# Patient Record
Sex: Male | Born: 1945 | Race: White | Hispanic: No | Marital: Married | Smoking: Never smoker
Health system: Southern US, Community
[De-identification: ages and names within clinical notes are randomized; demographics above are authoritative.]

## PROBLEM LIST (undated history)

## (undated) DIAGNOSIS — E785 Hyperlipidemia, unspecified: Secondary | ICD-10-CM

## (undated) DIAGNOSIS — I10 Essential (primary) hypertension: Secondary | ICD-10-CM

## (undated) DIAGNOSIS — C801 Malignant (primary) neoplasm, unspecified: Secondary | ICD-10-CM

## (undated) DIAGNOSIS — N189 Chronic kidney disease, unspecified: Secondary | ICD-10-CM

## (undated) DIAGNOSIS — I639 Cerebral infarction, unspecified: Secondary | ICD-10-CM

## (undated) DIAGNOSIS — C61 Malignant neoplasm of prostate: Secondary | ICD-10-CM

## (undated) HISTORY — DX: Chronic kidney disease, unspecified: N18.9

## (undated) HISTORY — DX: Essential (primary) hypertension: I10

## (undated) HISTORY — DX: Malignant (primary) neoplasm, unspecified: C80.1

## (undated) HISTORY — PX: PROSTATE SURGERY: SHX751

## (undated) HISTORY — PX: OTHER SURGICAL HISTORY: SHX169

## (undated) HISTORY — DX: Malignant neoplasm of prostate: C61

## (undated) HISTORY — DX: Hyperlipidemia, unspecified: E78.5

## (undated) HISTORY — DX: Cerebral infarction, unspecified: I63.9

## (undated) HISTORY — PX: HERNIA REPAIR: SHX51

---

## 2015-01-07 LAB — HM COLONOSCOPY

## 2016-10-18 DIAGNOSIS — I639 Cerebral infarction, unspecified: Secondary | ICD-10-CM | POA: Insufficient documentation

## 2016-11-08 DIAGNOSIS — Z85528 Personal history of other malignant neoplasm of kidney: Secondary | ICD-10-CM | POA: Insufficient documentation

## 2016-11-08 DIAGNOSIS — E782 Mixed hyperlipidemia: Secondary | ICD-10-CM | POA: Insufficient documentation

## 2016-11-08 DIAGNOSIS — I611 Nontraumatic intracerebral hemorrhage in hemisphere, cortical: Secondary | ICD-10-CM | POA: Insufficient documentation

## 2016-11-09 DIAGNOSIS — I10 Essential (primary) hypertension: Secondary | ICD-10-CM | POA: Insufficient documentation

## 2016-11-11 DIAGNOSIS — I609 Nontraumatic subarachnoid hemorrhage, unspecified: Secondary | ICD-10-CM | POA: Insufficient documentation

## 2017-12-28 DIAGNOSIS — N183 Chronic kidney disease, stage 3 unspecified: Secondary | ICD-10-CM | POA: Insufficient documentation

## 2017-12-28 DIAGNOSIS — I4510 Unspecified right bundle-branch block: Secondary | ICD-10-CM | POA: Insufficient documentation

## 2017-12-28 DIAGNOSIS — M8580 Other specified disorders of bone density and structure, unspecified site: Secondary | ICD-10-CM | POA: Insufficient documentation

## 2017-12-28 DIAGNOSIS — K824 Cholesterolosis of gallbladder: Secondary | ICD-10-CM | POA: Insufficient documentation

## 2019-05-08 ENCOUNTER — Encounter: Payer: Self-pay | Admitting: Family Medicine

## 2019-05-08 ENCOUNTER — Other Ambulatory Visit: Payer: Self-pay

## 2019-05-08 ENCOUNTER — Ambulatory Visit: Payer: Medicare PPO | Admitting: Family Medicine

## 2019-05-08 VITALS — BP 120/80 | HR 76 | Ht 69.0 in | Wt 191.0 lb

## 2019-05-08 DIAGNOSIS — Z23 Encounter for immunization: Secondary | ICD-10-CM

## 2019-05-08 DIAGNOSIS — Z85820 Personal history of malignant melanoma of skin: Secondary | ICD-10-CM

## 2019-05-08 DIAGNOSIS — I10 Essential (primary) hypertension: Secondary | ICD-10-CM

## 2019-05-08 DIAGNOSIS — Z7689 Persons encountering health services in other specified circumstances: Secondary | ICD-10-CM | POA: Diagnosis not present

## 2019-05-08 DIAGNOSIS — E782 Mixed hyperlipidemia: Secondary | ICD-10-CM | POA: Diagnosis not present

## 2019-05-08 DIAGNOSIS — C61 Malignant neoplasm of prostate: Secondary | ICD-10-CM

## 2019-05-08 MED ORDER — PROPRANOLOL HCL ER 60 MG PO CP24: 180.0000 mg | ORAL_CAPSULE | Freq: Every day | ORAL | 1 refills | Status: DC

## 2019-05-08 MED ORDER — LISINOPRIL 20 MG PO TABS: 20.0000 mg | ORAL_TABLET | Freq: Every day | ORAL | 1 refills | Status: DC

## 2019-05-08 MED ORDER — ROSUVASTATIN CALCIUM 10 MG PO TABS: 10.0000 mg | ORAL_TABLET | Freq: Every day | ORAL | 1 refills | Status: DC

## 2019-05-08 NOTE — Progress Notes (Signed)
Date:  05/08/2019   Name:  Jason Krause   DOB:  Jun 16, 1945   MRN:  [id number]   Chief Complaint: [place] (needs referral to derm and eye doctor for yearly check ups)  Patient is a 74 year old male who presents for a establishment exam. The patient reports the following problems: referral. Health maintenance has been reviewed needs pneumococcal 13   No results found for: CREATININE, BUN, NA, K, CL, CO2 No results found for: CHOL, HDL, LDLCALC, LDLDIRECT, TRIG, CHOLHDL No results found for: TSH No results found for: HGBA1C   Review of Systems  Constitutional: Negative for chills and fever.  HENT: Negative for drooling, ear discharge, ear pain and sore throat.   Respiratory: Negative for cough, shortness of breath and wheezing.   Cardiovascular: Negative for chest pain, palpitations and leg swelling.  Gastrointestinal: Negative for abdominal pain, blood in stool, constipation, diarrhea and nausea.  Endocrine: Negative for polydipsia.  Genitourinary: Negative for dysuria, frequency, hematuria and urgency.  Musculoskeletal: Negative for back pain, myalgias and neck pain.  Skin: Negative for rash.  Allergic/Immunologic: Negative for environmental allergies.  Neurological: Negative for dizziness and headaches.  Hematological: Does not bruise/bleed easily.  Psychiatric/Behavioral: Negative for suicidal ideas. The patient is not nervous/anxious.     Patient Active Problem List   Diagnosis Date Noted  . CKD (chronic kidney disease) stage 3, GFR 30-59 ml/min 12/28/2017  . Osteopenia 12/28/2017  . Polyp of gallbladder 12/28/2017  . Right bundle branch block 12/28/2017  . SAH (subarachnoid hemorrhage) ([place]) 11/11/2016  . Essential hypertension 11/09/2016  . Mixed hyperlipidemia 11/08/2016  . Nontraumatic cortical hemorrhage of left cerebral hemisphere ([place]) 11/08/2016  . Personal history of renal cell carcinoma 11/08/2016  . Cerebrovascular accident ([place]) 10/18/2016  .  History of kidney cancer 01/19/2007  . 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[place]) 01/12/2001    No Known Allergies   Social History   Tobacco Use  . Smoking status: Never Smoker  . Smokeless tobacco: Never Used  Substance Use Topics  . Alcohol use: Yes    Comment: social  . Drug use: Never     Medication list has been reviewed and updated.  Current Meds  Medication Sig  . Coenzyme Q10 100 MG capsule Take 1 capsule by mouth daily.  . folic acid (FOLVITE) A999333 MCG tablet Take 1 tablet by mouth daily.  . Glucos-Chondroit-Hyaluron-MSM (GLUCOSAMINE CHONDROITIN JOINT PO) Take 2 tablets by mouth daily.  Marland Kitchen lisinopril (ZESTRIL) 20 MG tablet Take 1 tablet by mouth daily.  . Multiple Vitamins-Minerals (CENTRUM SILVER PO) Take 1 tablet by mouth daily.  . propranolol ER (INDERAL LA) 60 MG 24 hr capsule Take 3 capsules by mouth daily. 2 capsules in am and 1 in the pm  . rosuvastatin (CRESTOR) 10 MG tablet Take 1 tablet by mouth daily.    PHQ 2/9 Scores 05/08/2019  PHQ - 2 Score 0  PHQ- 9 Score 0    BP Readings from Last 3 Encounters:  05/08/19 120/80    Physical Exam Vitals and nursing note reviewed.  HENT:     Head: Normocephalic.     Right Ear: Tympanic membrane and external ear normal.     Left Ear: Tympanic membrane and external ear normal.     Nose: Nose normal. No congestion.     Mouth/Throat:     Mouth: Mucous membranes are moist.  Eyes:     General: No scleral icterus.       Right eye: No discharge.  Left eye: No discharge.     Conjunctiva/sclera: Conjunctivae normal.     Pupils: Pupils are equal, round, and reactive to light.  Neck:     Thyroid: No thyromegaly.     Vascular: No JVD.     Trachea: No tracheal deviation.  Cardiovascular:     Rate and Rhythm: Normal rate and regular rhythm.     Heart sounds: Normal heart sounds. No murmur. No friction rub. No gallop.   Pulmonary:     Effort: No respiratory distress.     Breath sounds: Normal breath sounds. No wheezing or  rales.  Abdominal:     General: Bowel sounds are normal.     Palpations: Abdomen is soft. There is no mass.     Tenderness: There is no abdominal tenderness. There is no guarding or rebound.  Musculoskeletal:        General: No tenderness. Normal range of motion.     Cervical back: Normal range of motion and neck supple.  Lymphadenopathy:     Cervical: No cervical adenopathy.  Skin:    General: Skin is warm.     Findings: No rash.  Neurological:     Mental Status: He is alert and oriented to person, place, and time.     Cranial Nerves: No cranial nerve deficit.     Deep Tendon Reflexes: Reflexes are normal and symmetric.     Wt Readings from Last 3 Encounters:  05/08/19 191 lb (86.6 kg)    BP 120/80   Pulse 76   Ht 5\' 9"  (1.753 m)   Wt 191 lb (86.6 kg)   BMI 28.21 kg/m   Assessment and Plan: 1. Establishing care with new doctor, encounter for Patient to establish care with new physician.  Patient's previous encounters, care elsewhere, and most recent labs/imaging were reviewed.  Patient's medications will be refilled and referrals will be made per patient's desire. - Ambulatory referral to Dermatology - Ambulatory referral to Urology  2. Essential hypertension Chronic.  Controlled.  Stable.  Continue lisinopril 20 mg once a day and propranolol ER 60 mg 2 in the morning and 1 in the evening.  This is also used for tremor but is probably giving some blood pressure lowering effect. - lisinopril (ZESTRIL) 20 MG tablet; Take 1 tablet (20 mg total) by mouth daily.  Dispense: 90 tablet; Refill: 1 - propranolol ER (INDERAL LA) 60 MG 24 hr capsule; Take 3 capsules (180 mg total) by mouth daily. 2 capsules in am and 1 in the pm  Dispense: 270 capsule; Refill: 1  3. Mixed hyperlipidemia Chronic.  Controlled.  Stable.  Continue Crestor 10 mg once a day. - rosuvastatin (CRESTOR) 10 MG tablet; Take 1 tablet (10 mg total) by mouth daily.  Dispense: 90 tablet; Refill: 1  4. Need for  pneumococcal vaccination Discussed and administered - Pneumococcal polysaccharide vaccine 23-valent greater than or equal to 2yo subcutaneous/IM  5. Carcinoma of prostate [facility]) Patient has a history of prostate cancer status post prostatectomy which is reevaluating on an annual basis by urology.  Patient has moved here from [place] and we will establish with urology for follow-up maintenance.  6. History of melanoma Patient has a history of melanoma but other concerns that I suspect are of an actinic/basal cell characteristic.  Patient desires to see a local dermatologist and is very pleased to have locally..  Will refer for evaluation and treatment to Dr. Phillip Heal dermatology.

## 2019-05-08 NOTE — Patient Instructions (Signed)

## 2019-05-09 ENCOUNTER — Other Ambulatory Visit: Payer: Medicare PPO

## 2019-05-09 DIAGNOSIS — I10 Essential (primary) hypertension: Secondary | ICD-10-CM

## 2019-05-09 DIAGNOSIS — E782 Mixed hyperlipidemia: Secondary | ICD-10-CM

## 2019-05-10 LAB — LIPID PANEL
Chol/HDL Ratio: 3.3 ratio (ref 0.0–5.0)
Cholesterol, Total: 153 mg/dL (ref 100–199)
HDL: 46 mg/dL (ref >39)
LDL Chol Calc (NIH): 93 mg/dL (ref 0–99)
Triglycerides: 73 mg/dL (ref 0–149)
VLDL Cholesterol Cal: 14 mg/dL (ref 5–40)

## 2019-05-10 LAB — RENAL FUNCTION PANEL
Albumin: 4.5 g/dL (ref 3.7–4.7)
BUN/Creatinine Ratio: 13 (ref 10–24)
BUN: 18 mg/dL (ref 8–27)
CO2: 25 mmol/L (ref 20–29)
Calcium: 10.2 mg/dL (ref 8.6–10.2)
Chloride: 105 mmol/L (ref 96–106)
Creatinine, Ser: 1.36 mg/dL — ABNORMAL HIGH (ref 0.76–1.27)
GFR calc Af Amer: 60 mL/min/{1.73_m2} (ref >59)
GFR calc non Af Amer: 52 mL/min/{1.73_m2} — ABNORMAL LOW (ref >59)
Glucose: 76 mg/dL (ref 65–99)
Phosphorus: 3.2 mg/dL (ref 2.8–4.1)
Potassium: 4.5 mmol/L (ref 3.5–5.2)
Sodium: 145 mmol/L — ABNORMAL HIGH (ref 134–144)

## 2019-05-10 LAB — PSA: Prostate Specific Ag, Serum: <0.1 ng/mL (ref 0.0–4.0)

## 2019-05-22 ENCOUNTER — Ambulatory Visit (INDEPENDENT_AMBULATORY_CARE_PROVIDER_SITE_OTHER): Payer: Medicare PPO | Admitting: Urology

## 2019-05-22 ENCOUNTER — Encounter: Payer: Self-pay | Admitting: Urology

## 2019-05-22 ENCOUNTER — Other Ambulatory Visit: Payer: Self-pay

## 2019-05-22 VITALS — BP 144/80 | HR 55 | Ht 69.0 in | Wt 191.0 lb

## 2019-05-22 DIAGNOSIS — Z85528 Personal history of other malignant neoplasm of kidney: Secondary | ICD-10-CM

## 2019-05-22 DIAGNOSIS — C61 Malignant neoplasm of prostate: Secondary | ICD-10-CM | POA: Diagnosis not present

## 2019-05-22 NOTE — Progress Notes (Signed)
05/22/19 1:23 PM   Jason Krause Dec 22, 1945 [id number]  Referring provider: Juline Patch, MD [address] [place],  [place] [address]  CC: Hx of kidney and prostate cancer  HPI: I saw [name] today to establish care for history of kidney and prostate cancer today.  Briefly he is a very healthy 74 year old male who recently moved to the area from [place].  He was previously under the care of Dr. Sherlyn Lick at [facility] urological.  He reportedly underwent an open radical prostatectomy 19 years ago for prostate cancer, as well as an open nephrectomy around 10 years ago for kidney cancer.  He is unsure what his pathology was.  PSA has remained undetectable, and was most recently <0.1 in January 2021.  He has mild CKD at baseline with most recent creatinine of 1.36, eGFR 52.  He denies any flank pain or gross hematuria.  He occasionally will have some symptoms of weak stream, but is minimally bothered by this.  He denies any history of urinary tract infections or retention.   PMH: Past Medical History:  Diagnosis Date  . Cancer [facility])    kidney and prostate  . Chronic kidney disease    stage 3  . Hyperlipidemia   . Hypertension   . Stroke [facility])     Surgical History: Past Surgical History:  Procedure Laterality Date  . HERNIA REPAIR    . kidney removal due to cancer Right   . PROSTATE SURGERY     removed   Allergies: No Known Allergies  Family History: Family History  Problem Relation Age of Onset  . Cancer Father   . Heart disease Father   . Diabetes Sister   . Stroke Maternal Grandmother   . Heart disease Paternal Grandmother     Social History:  reports that he has never smoked. He has never used smokeless tobacco. He reports current alcohol use. He reports that he does not use drugs.  ROS: Please see flowsheet from today's date for complete review of systems.  Physical Exam: BP (!) 144/80 (BP Location: Left Arm, Patient Position: Sitting, Cuff Size:  Normal)   Pulse (!) 55   Ht 5' 9"  (1.753 m)   Wt 191 lb (86.6 kg)   BMI 28.21 kg/m    Constitutional:  Alert and oriented, No acute distress. Cardiovascular: No clubbing, cyanosis, or edema. Respiratory: Normal respiratory effort, no increased work of breathing. GI: Abdomen is soft, nontender, nondistended, no abdominal masses Lymph: No cervical or inguinal lymphadenopathy. Skin: No rashes, bruises or suspicious lesions. Neurologic: Grossly intact, no focal deficits, moving all 4 extremities. Psychiatric: Normal mood and affect.  Laboratory Data: Reviewed, see HPI  Pertinent Imaging: No prior imaging available to review  Assessment & Plan:   In summary, he is a healthy 74 year old male with reported history of open radical prostatectomy 19 years ago with no recurrence of PSA since that time, as well as an open radical nephrectomy approximately 10 years ago for kidney cancer.  He was previously in the care of Dr. Sherlyn Lick at [facility] urological, and his records are not available to me today.  We discussed that we typically discontinue PSA surveillance 10 years out from definitive treatment for prostate cancer.  Follow-up after kidney cancer is based on staging and cell type.  We will obtain outside urology records, but he likely does not require further surveillance imaging or lab work based on AUA guidelines.  Follow-up pending these results.  A total of 45 minutes  were spent face-to-face with the patient, greater than 50% was spent in patient education, counseling, and coordination of care regarding history of prostate and kidney cancer.  Billey Co, [facility] [address] [place], [place] [address] [phone]

## 2019-05-25 ENCOUNTER — Ambulatory Visit: Payer: Self-pay | Attending: Internal Medicine

## 2019-05-25 ENCOUNTER — Other Ambulatory Visit: Payer: Self-pay

## 2019-05-25 DIAGNOSIS — Z23 Encounter for immunization: Secondary | ICD-10-CM | POA: Insufficient documentation

## 2019-05-25 NOTE — Progress Notes (Signed)
   Covid-19 Vaccination Clinic  Name:  Jason Krause    MRN: [id number] DOB: 23-Nov-1945  05/25/2019  [name] was observed post Covid-19 immunization for 15 minutes without incidence. He was provided with Vaccine Information Sheet and instruction to access the V-Safe system.   Mr. Mode was instructed to call 911 with any severe reactions post vaccine: Marland Kitchen Difficulty breathing  . Swelling of your face and throat  . A fast heartbeat  . A bad rash all over your body  . Dizziness and weakness    Immunizations Administered    Name Date Dose VIS Date Route   Moderna COVID-19 Vaccine 05/25/2019  5:16 PM 0.5 mL 03/20/2019 Intramuscular   Manufacturer: Moderna   Lot: [id number]   [id number]

## 2019-06-26 ENCOUNTER — Ambulatory Visit: Payer: Self-pay | Attending: Internal Medicine

## 2019-06-26 DIAGNOSIS — Z23 Encounter for immunization: Secondary | ICD-10-CM | POA: Insufficient documentation

## 2019-06-26 NOTE — Progress Notes (Signed)
   Covid-19 Vaccination Clinic  Name:  Jason Krause    MRN: [id number] DOB: 10-Dec-1945  06/26/2019  [name] was observed post Covid-19 immunization for 15 minutes without incident. He was provided with Vaccine Information Sheet and instruction to access the V-Safe system.   [name] was instructed to call 911 with any severe reactions post vaccine: Marland Kitchen Difficulty breathing  . Swelling of face and throat  . A fast heartbeat  . A bad rash all over body  . Dizziness and weakness   Immunizations Administered    Name Date Dose VIS Date Route   Moderna COVID-19 Vaccine 06/26/2019  4:18 PM 0.5 mL 03/20/2019 Intramuscular   Manufacturer: Moderna   Lot: [id number]   [id number]

## 2019-08-09 DIAGNOSIS — Z85820 Personal history of malignant melanoma of skin: Secondary | ICD-10-CM | POA: Diagnosis not present

## 2019-08-09 DIAGNOSIS — L570 Actinic keratosis: Secondary | ICD-10-CM | POA: Diagnosis not present

## 2019-08-09 DIAGNOSIS — L578 Other skin changes due to chronic exposure to nonionizing radiation: Secondary | ICD-10-CM | POA: Diagnosis not present

## 2019-08-27 ENCOUNTER — Encounter: Payer: Self-pay | Admitting: Family Medicine

## 2019-09-06 DIAGNOSIS — M170 Bilateral primary osteoarthritis of knee: Secondary | ICD-10-CM | POA: Diagnosis not present

## 2019-10-05 ENCOUNTER — Other Ambulatory Visit: Payer: Self-pay | Admitting: Family Medicine

## 2019-10-05 DIAGNOSIS — E782 Mixed hyperlipidemia: Secondary | ICD-10-CM

## 2019-10-24 DIAGNOSIS — I610 Nontraumatic intracerebral hemorrhage in hemisphere, subcortical: Secondary | ICD-10-CM | POA: Diagnosis not present

## 2019-11-01 DIAGNOSIS — M6281 Muscle weakness (generalized): Secondary | ICD-10-CM | POA: Diagnosis not present

## 2019-11-01 DIAGNOSIS — R262 Difficulty in walking, not elsewhere classified: Secondary | ICD-10-CM | POA: Diagnosis not present

## 2019-11-06 ENCOUNTER — Encounter: Payer: Self-pay | Admitting: Family Medicine

## 2019-11-06 ENCOUNTER — Other Ambulatory Visit: Payer: Self-pay

## 2019-11-06 ENCOUNTER — Ambulatory Visit: Payer: Medicare PPO | Admitting: Family Medicine

## 2019-11-06 VITALS — BP 130/80 | HR 68 | Ht 69.0 in | Wt 186.0 lb

## 2019-11-06 DIAGNOSIS — Z1211 Encounter for screening for malignant neoplasm of colon: Secondary | ICD-10-CM

## 2019-11-06 DIAGNOSIS — N1831 Chronic kidney disease, stage 3a: Secondary | ICD-10-CM

## 2019-11-06 DIAGNOSIS — C61 Malignant neoplasm of prostate: Secondary | ICD-10-CM | POA: Diagnosis not present

## 2019-11-06 DIAGNOSIS — I10 Essential (primary) hypertension: Secondary | ICD-10-CM | POA: Diagnosis not present

## 2019-11-06 DIAGNOSIS — I679 Cerebrovascular disease, unspecified: Secondary | ICD-10-CM | POA: Diagnosis not present

## 2019-11-06 DIAGNOSIS — E782 Mixed hyperlipidemia: Secondary | ICD-10-CM

## 2019-11-06 DIAGNOSIS — Z85528 Personal history of other malignant neoplasm of kidney: Secondary | ICD-10-CM | POA: Diagnosis not present

## 2019-11-06 LAB — POCT URINALYSIS DIPSTICK
Bilirubin, UA: NEGATIVE
Blood, UA: NEGATIVE
Glucose, UA: NEGATIVE
Ketones, UA: NEGATIVE
Leukocytes, UA: NEGATIVE
Nitrite, UA: NEGATIVE
Protein, UA: NEGATIVE
Spec Grav, UA: 1.02 (ref 1.010–1.025)
Urobilinogen, UA: 0.2 E.U./dL
pH, UA: 5 (ref 5.0–8.0)

## 2019-11-06 MED ORDER — ROSUVASTATIN CALCIUM 10 MG PO TABS: ORAL_TABLET | ORAL | 1 refills | Status: DC

## 2019-11-06 MED ORDER — PROPRANOLOL HCL ER 60 MG PO CP24: 180.0000 mg | ORAL_CAPSULE | Freq: Every day | ORAL | 1 refills | Status: DC

## 2019-11-06 MED ORDER — LISINOPRIL 20 MG PO TABS: 20.0000 mg | ORAL_TABLET | Freq: Every day | ORAL | 1 refills | Status: DC

## 2019-11-06 NOTE — Progress Notes (Signed)
Date:  11/06/2019   Name:  Jason Krause   DOB:  [birth year]   MRN:  [id number]   Chief Complaint: Hypertension and Hyperlipidemia  Hypertension This is a chronic problem. The current episode started more than 1 year ago. The problem has been gradually improving since onset. The problem is controlled. Pertinent negatives include no anxiety, blurred vision, chest pain, headaches, malaise/fatigue, neck pain, orthopnea, palpitations, peripheral edema, PND, shortness of breath or sweats. There are no associated agents to hypertension. Risk factors for coronary artery disease include dyslipidemia and male gender. Past treatments include ACE inhibitors and beta blockers. The current treatment provides moderate improvement. There are no compliance problems.  There is no history of angina, kidney disease, CAD/MI, CVA, heart failure, left ventricular hypertrophy or PVD. Identifiable causes of hypertension include renovascular disease. There is no history of chronic renal disease or coarctation of the aorta.  Hyperlipidemia This is a chronic problem. The current episode started more than 1 year ago. The problem is controlled. Recent lipid tests were reviewed and are normal. He has no history of chronic renal disease, diabetes, hypothyroidism, liver disease, obesity or nephrotic syndrome. Associated symptoms include a focal weakness. Pertinent negatives include no chest pain, focal sensory loss, myalgias or shortness of breath. Current antihyperlipidemic treatment includes statins. The current treatment provides moderate improvement of lipids.  Neurologic Problem The patient's primary symptoms include focal weakness. The patient's pertinent negatives include no altered mental status, clumsiness, focal sensory loss, loss of balance, memory loss, near-syncope, slurred speech, syncope, visual change or weakness. Primary symptoms comment: cerebral vascular disease. This is a chronic problem. The current episode  started more than 1 year ago. Pertinent negatives include no abdominal pain, back pain, chest pain, dizziness, fever, headaches, nausea, neck pain, palpitations or shortness of breath. There is no history of liver disease.    Lab Results  Component Value Date   CREATININE 1.36 (H) 05/09/2019   BUN 18 05/09/2019   NA 145 (H) 05/09/2019   K 4.5 05/09/2019   CL 105 05/09/2019   CO2 25 05/09/2019   Lab Results  Component Value Date   CHOL 153 05/09/2019   HDL 46 05/09/2019   LDLCALC 93 05/09/2019   TRIG 73 05/09/2019   CHOLHDL 3.3 05/09/2019   No results found for: TSH No results found for: HGBA1C No results found for: WBC, HGB, HCT, MCV, PLT No results found for: ALT, AST, GGT, ALKPHOS, BILITOT   Review of Systems  Constitutional: Negative for chills, fever and malaise/fatigue.  HENT: Negative for drooling, ear discharge, ear pain and sore throat.   Eyes: Negative for blurred vision.  Respiratory: Negative for cough, shortness of breath and wheezing.   Cardiovascular: Negative for chest pain, palpitations, orthopnea, leg swelling, PND and near-syncope.  Gastrointestinal: Negative for abdominal pain, blood in stool, constipation, diarrhea and nausea.  Endocrine: Negative for polydipsia.  Genitourinary: Negative for dysuria, frequency, hematuria and urgency.  Musculoskeletal: Negative for back pain, myalgias and neck pain.  Skin: Negative for rash.  Allergic/Immunologic: Negative for environmental allergies.  Neurological: Positive for focal weakness. Negative for dizziness, syncope, weakness, headaches and loss of balance.  Hematological: Does not bruise/bleed easily.  Psychiatric/Behavioral: Negative for memory loss and suicidal ideas. The patient is not nervous/anxious.     Patient Active Problem List   Diagnosis Date Noted  . CKD (chronic kidney disease) stage 3, GFR 30-59 ml/min 12/28/2017  . Osteopenia 12/28/2017  . Polyp of gallbladder 12/28/2017  . Right bundle  branch block 12/28/2017  . SAH (subarachnoid hemorrhage) ([place]) 11/11/2016  . Essential hypertension 11/09/2016  . Mixed hyperlipidemia 11/08/2016  . Nontraumatic cortical hemorrhage of left cerebral hemisphere ([place]) 11/08/2016  . Personal history of renal cell carcinoma 11/08/2016  . Cerebrovascular accident ([place]) 10/18/2016  . History of kidney cancer 01/19/2007  . 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[place]) 01/12/2001    No Known Allergies  Past Surgical History:  Procedure Laterality Date  . HERNIA REPAIR    . kidney removal due to cancer Right   . PROSTATE SURGERY     removed    Social History   Tobacco Use  . Smoking status: Never Smoker  . Smokeless tobacco: Never Used  Substance Use Topics  . Alcohol use: Yes    Comment: social  . Drug use: Never     Medication list has been reviewed and updated.  Current Meds  Medication Sig  . Coenzyme Q10 100 MG capsule Take 1 capsule by mouth daily.  . folic acid (FOLVITE) 798 MCG tablet Take 1 tablet by mouth daily.  . Glucos-Chondroit-Hyaluron-MSM (GLUCOSAMINE CHONDROITIN JOINT PO) Take 2 tablets by mouth daily.  Marland Kitchen lisinopril (ZESTRIL) 20 MG tablet Take 1 tablet (20 mg total) by mouth daily.  . Multiple Vitamins-Minerals (CENTRUM SILVER PO) Take 1 tablet by mouth daily.  . propranolol ER (INDERAL LA) 60 MG 24 hr capsule Take 3 capsules (180 mg total) by mouth daily. 2 capsules in am and 1 in the pm  . rosuvastatin (CRESTOR) 10 MG tablet TAKE (1) TABLET BY MOUTH EVERY DAY    PHQ 2/9 Scores 11/06/2019 05/08/2019  PHQ - 2 Score 0 0  PHQ- 9 Score 0 0    GAD 7 : Generalized Anxiety Score 11/06/2019 05/08/2019  Nervous, Anxious, on Edge 0 0  Control/stop worrying 0 0  Worry too much - different things 0 0  Trouble relaxing 0 0  Restless 0 0  Easily annoyed or irritable 0 0  Afraid - awful might happen 0 0  Total GAD 7 Score 0 0    BP Readings from Last 3 Encounters:  11/06/19 130/80  05/22/19 (!) 144/80  05/08/19 120/80     Physical Exam Vitals and nursing note reviewed.  HENT:     Head: Normocephalic.     Right Ear: Tympanic membrane, ear canal and external ear normal. There is no impacted cerumen.     Left Ear: Tympanic membrane, ear canal and external ear normal. There is no impacted cerumen.     Nose: Nose normal. No congestion or rhinorrhea.  Eyes:     General: No scleral icterus.       Right eye: No discharge.        Left eye: No discharge.     Conjunctiva/sclera: Conjunctivae normal.     Pupils: Pupils are equal, round, and reactive to light.  Neck:     Thyroid: No thyromegaly.     Vascular: No JVD.     Trachea: No tracheal deviation.  Cardiovascular:     Rate and Rhythm: Normal rate and regular rhythm.     Heart sounds: Normal heart sounds. No murmur heard.  No friction rub. No gallop.   Pulmonary:     Effort: No respiratory distress.     Breath sounds: Normal breath sounds. No wheezing, rhonchi or rales.  Abdominal:     General: Bowel sounds are normal.     Palpations: Abdomen is soft. There is no mass.     Tenderness: There is  no abdominal tenderness. There is no guarding or rebound.  Genitourinary:    Rectum: Normal. Guaiac result negative.     Comments: Prostate absent Musculoskeletal:        General: No tenderness. Normal range of motion.     Cervical back: Normal range of motion and neck supple.  Lymphadenopathy:     Cervical: No cervical adenopathy.  Skin:    General: Skin is warm.     Findings: No rash.  Neurological:     Mental Status: He is alert and oriented to person, place, and time.     Cranial Nerves: No cranial nerve deficit.     Deep Tendon Reflexes: Reflexes are normal and symmetric.     Wt Readings from Last 3 Encounters:  11/06/19 186 lb (84.4 kg)  05/22/19 191 lb (86.6 kg)  05/08/19 191 lb (86.6 kg)    BP 130/80   Pulse 68   Ht 5\' 9"  (1.753 m)   Wt 186 lb (84.4 kg)   BMI 27.47 kg/m   Assessment and Plan: 1. Essential hypertension Chronic.  Controlled. Stable. Continue lisinopril 20 mg once a day. We will also continue propranolol ER 60 mg 3 capsules by mouth daily 2 capsules in the morning and 1 in the evening. We will check a comprehensive metabolic panel to evaluate electrolytes and GFR. - Comprehensive Metabolic Panel (CMET) - lisinopril (ZESTRIL) 20 MG tablet; Take 1 tablet (20 mg total) by mouth daily.  Dispense: 90 tablet; Refill: 1 - propranolol ER (INDERAL LA) 60 MG 24 hr capsule; Take 3 capsules (180 mg total) by mouth daily. 2 capsules in am and 1 in the pm  Dispense: 270 capsule; Refill: 1  2. Mixed hyperlipidemia Chronic. Controlled. Stable. Continue rosuvastatin 10 mg once a day. Will check lipid panel for current status of lipid management. - rosuvastatin (CRESTOR) 10 MG tablet; TAKE (1) TABLET BY MOUTH EVERY DAY  Dispense: 90 tablet; Refill: 1 - Lipid panel  3. Cerebrovascular disease Patient has a history of CVA that he is currently doing well with. He is not on any antiplatelet therapy due to the hemorrhagic nature of the event. We will treat by optimizing blood pressure/lipid/and diet control.  4. 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[place]) Followed by Dr. Gillermo Murdoch review of patient's prostate surgery and history of prostatic cancer notes that we are now 20 years out from the surgery and patient has done well and will not need further evaluation we will check PSA to make certain that is remaining in the very low range. - PSA  5. History of kidney cancer Followed by Dr. Bevelyn Ngo patient history of what sounds like renal cell carcinoma. Review of urology notes notes that they had sent for information concerning staging of this and that pending this information will dictate further evaluation and maintenance surveillance. Urinalysis dipstick was done for evaluation of any microscopic hematuria. There were no RBCs noted on dipstick. - POCT urinalysis dipstick  6. Colon cancer screening Discussed with patient and referral was made with  gastroenterology for colonoscopy. - Ambulatory referral to Gastroenterology  7. Stage 3a chronic kidney disease Chronic. Discussed with patient. Patient is followed by Dr. Di Kindle at [facility] nephrology. Patient does have a mild decrease in GFR which is followed. We will check CMP for evaluation. - Comprehensive Metabolic Panel (CMET)

## 2019-11-07 LAB — LIPID PANEL
Chol/HDL Ratio: 3.6 ratio (ref 0.0–5.0)
Cholesterol, Total: 149 mg/dL (ref 100–199)
HDL: 41 mg/dL (ref >39)
LDL Chol Calc (NIH): 93 mg/dL (ref 0–99)
Triglycerides: 75 mg/dL (ref 0–149)
VLDL Cholesterol Cal: 15 mg/dL (ref 5–40)

## 2019-11-07 LAB — PSA: Prostate Specific Ag, Serum: <0.1 ng/mL (ref 0.0–4.0)

## 2019-11-07 LAB — COMPREHENSIVE METABOLIC PANEL
ALT: 15 IU/L (ref 0–44)
AST: 23 IU/L (ref 0–40)
Albumin/Globulin Ratio: 1.9 (ref 1.2–2.2)
Albumin: 4.4 g/dL (ref 3.7–4.7)
Alkaline Phosphatase: 98 IU/L (ref 48–121)
BUN/Creatinine Ratio: 12 (ref 10–24)
BUN: 18 mg/dL (ref 8–27)
Bilirubin Total: 1 mg/dL (ref 0.0–1.2)
CO2: 27 mmol/L (ref 20–29)
Calcium: 10.5 mg/dL — ABNORMAL HIGH (ref 8.6–10.2)
Chloride: 103 mmol/L (ref 96–106)
Creatinine, Ser: 1.46 mg/dL — ABNORMAL HIGH (ref 0.76–1.27)
GFR calc Af Amer: 55 mL/min/{1.73_m2} — ABNORMAL LOW (ref >59)
GFR calc non Af Amer: 47 mL/min/{1.73_m2} — ABNORMAL LOW (ref >59)
Globulin, Total: 2.3 g/dL (ref 1.5–4.5)
Glucose: 80 mg/dL (ref 65–99)
Potassium: 5.4 mmol/L — ABNORMAL HIGH (ref 3.5–5.2)
Sodium: 143 mmol/L (ref 134–144)
Total Protein: 6.7 g/dL (ref 6.0–8.5)

## 2019-11-13 ENCOUNTER — Ambulatory Visit (INDEPENDENT_AMBULATORY_CARE_PROVIDER_SITE_OTHER): Payer: Medicare PPO | Admitting: Urology

## 2019-11-13 ENCOUNTER — Other Ambulatory Visit: Payer: Self-pay

## 2019-11-13 ENCOUNTER — Encounter: Payer: Self-pay | Admitting: Urology

## 2019-11-13 VITALS — BP 152/95 | HR 52 | Ht 69.0 in | Wt 190.0 lb

## 2019-11-13 DIAGNOSIS — C641 Malignant neoplasm of right kidney, except renal pelvis: Secondary | ICD-10-CM

## 2019-11-13 DIAGNOSIS — C61 Malignant neoplasm of prostate: Secondary | ICD-10-CM | POA: Diagnosis not present

## 2019-11-13 NOTE — Progress Notes (Signed)
   11/13/2019 9:11 AM   [name] [birth year] [id number]  Reason for visit: Follow up history of prostate cancer and kidney cancer  HPI: Briefly, he is a 74 year old healthy male with a history of an open radical prostatectomy about 20 years ago, with undetectable PSA since that time, as well as an open right radical nephrectomy approximately 10 years ago for kidney cancer.  Those records are still unavailable to me, but I will work to obtain those today from Dr. Sherlyn Lick at [facility] urological.  Recent PSA remains undetectable.  He had a slight bump in his creatinine to 1.46 from 1.36 six months ago.  There is no recent imaging to review.  He reports his prior urologist was doing MRIs every other year for his history of kidney cancer.  We reviewed the guidelines that do not recommend routine surveillance after 5 years, even in patients with high risk disease, however surveillance is optional at patient preference at that point.  He would prefer to get yearly renal ultrasounds with his good health which is not unreasonable.  We will obtain outside records from [facility] urological Renal ultrasound, call with results RTC 1 year, likely continue yearly renal ultrasounds  Billey Co, [place] [address], [place], [place] [address] [phone]

## 2019-11-14 ENCOUNTER — Other Ambulatory Visit: Payer: Self-pay

## 2019-11-14 ENCOUNTER — Telehealth (INDEPENDENT_AMBULATORY_CARE_PROVIDER_SITE_OTHER): Payer: Self-pay | Admitting: Gastroenterology

## 2019-11-14 DIAGNOSIS — Z1211 Encounter for screening for malignant neoplasm of colon: Secondary | ICD-10-CM

## 2019-11-14 NOTE — Progress Notes (Signed)
Gastroenterology Pre-Procedure Review  Request Date: Thursday 08/19 Requesting Physician: Dr. Allen Norris  PATIENT REVIEW QUESTIONS: The patient responded to the following health history questions as indicated:    1. Are you having any GI issues? no 2. Do you have a personal history of Polyps? yes (date unknown) 3. Do you have a family history of Colon Cancer or Polyps? no 4. Diabetes Mellitus? no 5. Joint replacements in the past 12 months?no 6. Major health problems in the past 3 months?no 7. Any artificial heart valves, MVP, or defibrillator?no    MEDICATIONS & ALLERGIES:    Patient reports the following regarding taking any anticoagulation/antiplatelet therapy:   Plavix, Coumadin, Eliquis, Xarelto, Lovenox, Pradaxa, Brilinta, or Effient? no Aspirin? no  Patient confirms/reports the following medications:  Current Outpatient Medications  Medication Sig Dispense Refill  . Coenzyme Q10 100 MG capsule Take 1 capsule by mouth daily.    . folic acid (FOLVITE) 384 MCG tablet Take 1 tablet by mouth daily.    . Glucos-Chondroit-Hyaluron-MSM (GLUCOSAMINE CHONDROITIN JOINT PO) Take 2 tablets by mouth daily.    Marland Kitchen lisinopril (ZESTRIL) 20 MG tablet Take 1 tablet (20 mg total) by mouth daily. 90 tablet 1  . Multiple Vitamins-Minerals (CENTRUM SILVER PO) Take 1 tablet by mouth daily.    . propranolol ER (INDERAL LA) 60 MG 24 hr capsule Take 3 capsules (180 mg total) by mouth daily. 2 capsules in am and 1 in the pm 270 capsule 1  . rosuvastatin (CRESTOR) 10 MG tablet TAKE (1) TABLET BY MOUTH EVERY DAY 90 tablet 1   No current facility-administered medications for this visit.    Patient confirms/reports the following allergies:  Allergies  Allergen Reactions  . Carvedilol Other (See Comments)    No orders of the defined types were placed in this encounter.   AUTHORIZATION INFORMATION Primary Insurance: 1D#: Group #:  Secondary Insurance: 1D#: Group #:  SCHEDULE INFORMATION: Date:  Thursday 12/06/19 Time: Location:[facility]

## 2019-11-15 ENCOUNTER — Telehealth: Payer: Self-pay

## 2019-11-15 NOTE — Telephone Encounter (Signed)
Patient contacted the office expressed concern regarding the salt content of the SuPrep Bowel Kit that he will use for his colonoscopy scheduled on 12/06/19.  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  He stated that in the past he had to use a different bowel prep.  I asked him the name of the bowel prep used and he did not recall, but said he would try to find out what it was.    Please advise on whether SuPrep is the appropriate bowel prep he should use considering he has one kidney.  Thanks,  [place], [place]

## 2019-11-16 ENCOUNTER — Other Ambulatory Visit: Payer: Self-pay

## 2019-11-16 MED ORDER — GOLYTELY 236 G PO SOLR: 4000.0000 mL | Freq: Once | ORAL | 0 refills | Status: AC

## 2019-11-16 NOTE — Telephone Encounter (Signed)
Yes the suprep is a problem with people with impaired kidney function. 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

## 2019-11-21 DIAGNOSIS — N183 Chronic kidney disease, stage 3 unspecified: Secondary | ICD-10-CM | POA: Diagnosis not present

## 2019-11-22 ENCOUNTER — Telehealth: Payer: Self-pay | Admitting: Family Medicine

## 2019-11-22 NOTE — Telephone Encounter (Signed)
Left message for patient to call back and schedule Medicare Annual Wellness Visit (AWV) either virtually/audio only or in office. Whichever the patients preference is.  No history of AWV; please schedule at anytime with [facility] Health Advisor.  This should be a 40 minute visit

## 2019-11-26 DIAGNOSIS — M6281 Muscle weakness (generalized): Secondary | ICD-10-CM | POA: Diagnosis not present

## 2019-11-26 DIAGNOSIS — R262 Difficulty in walking, not elsewhere classified: Secondary | ICD-10-CM | POA: Diagnosis not present

## 2019-11-28 ENCOUNTER — Other Ambulatory Visit: Payer: Self-pay

## 2019-11-28 ENCOUNTER — Ambulatory Visit
Admission: RE | Admit: 2019-11-28 | Discharge: 2019-11-28 | Disposition: A | Discharge status: Home / Self Care | Payer: Medicare PPO | Source: Ambulatory Visit | Attending: Urology | Admitting: Urology

## 2019-11-28 ENCOUNTER — Encounter: Payer: Self-pay | Admitting: Gastroenterology

## 2019-11-28 DIAGNOSIS — C641 Malignant neoplasm of right kidney, except renal pelvis: Secondary | ICD-10-CM | POA: Insufficient documentation

## 2019-11-28 DIAGNOSIS — N281 Cyst of kidney, acquired: Secondary | ICD-10-CM | POA: Diagnosis not present

## 2019-12-03 ENCOUNTER — Other Ambulatory Visit: Payer: Medicare PPO

## 2019-12-04 ENCOUNTER — Other Ambulatory Visit
Admission: RE | Admit: 2019-12-04 | Discharge: 2019-12-04 | Disposition: A | Discharge status: Home / Self Care | Payer: Medicare PPO | Source: Ambulatory Visit | Attending: Gastroenterology | Admitting: Gastroenterology

## 2019-12-04 ENCOUNTER — Other Ambulatory Visit: Payer: Self-pay

## 2019-12-04 DIAGNOSIS — Z20822 Contact with and (suspected) exposure to covid-19: Secondary | ICD-10-CM | POA: Diagnosis not present

## 2019-12-04 DIAGNOSIS — Z01812 Encounter for preprocedural laboratory examination: Secondary | ICD-10-CM | POA: Diagnosis not present

## 2019-12-04 LAB — SARS CORONAVIRUS 2 (TAT 6-24 HRS): SARS Coronavirus 2: NEGATIVE

## 2019-12-05 NOTE — Discharge Instructions (Signed)
General Anesthesia, Adult, Care After This sheet gives you information about how to care for yourself after your procedure. Your health care provider may also give you more specific instructions. If you have problems or questions, contact your health care provider. What can I expect after the procedure? After the procedure, the following side effects are common:  Pain or discomfort at the IV site.  Nausea.  Vomiting.  Sore throat.  Trouble concentrating.  Feeling cold or chills.  Weak or tired.  Sleepiness and fatigue.  Soreness and body aches. These side effects can affect parts of the body that were not involved in surgery. Follow these instructions at home:  For at least 24 hours after the procedure:  Have a responsible adult stay with you. It is important to have someone help care for you until you are awake and alert.  Rest as needed.  Do not: ? Participate in activities in which you could fall or become injured. ? Drive. ? Use heavy machinery. ? Drink alcohol. ? Take sleeping pills or medicines that cause drowsiness. ? Make important decisions or sign legal documents. ? Take care of children on your own. Eating and drinking  Follow any instructions from your health care provider about eating or drinking restrictions.  When you feel hungry, start by eating small amounts of foods that are soft and easy to digest (bland), such as toast. Gradually return to your regular diet.  Drink enough fluid to keep your urine pale yellow.  If you vomit, rehydrate by drinking water, juice, or clear broth. General instructions  If you have sleep apnea, surgery and certain medicines can increase your risk for breathing problems. Follow instructions from your health care provider about wearing your sleep device: ? Anytime you are sleeping, including during daytime naps. ? While taking prescription pain medicines, sleeping medicines, or medicines that make you drowsy.  Return to  your normal activities as told by your health care provider. Ask your health care provider what activities are safe for you.  Take over-the-counter and prescription medicines only as told by your health care provider.  If you smoke, do not smoke without supervision.  Keep all follow-up visits as told by your health care provider. This is important. Contact a health care provider if:  You have nausea or vomiting that does not get better with medicine.  You cannot eat or drink without vomiting.  You have pain that does not get better with medicine.  You are unable to pass urine.  You develop a skin rash.  You have a fever.  You have redness around your IV site that gets worse. Get help right away if:  You have difficulty breathing.  You have chest pain.  You have blood in your urine or stool, or you vomit blood. Summary  After the procedure, it is common to have a sore throat or nausea. It is also common to feel tired.  Have a responsible adult stay with you for the first 24 hours after general anesthesia. It is important to have someone help care for you until you are awake and alert.  When you feel hungry, start by eating small amounts of foods that are soft and easy to digest (bland), such as toast. Gradually return to your regular diet.  Drink enough fluid to keep your urine pale yellow.  Return to your normal activities as told by your health care provider. Ask your health care provider what activities are safe for you. This information is not   intended to replace advice given to you by your health care provider. Make sure you discuss any questions you have with your health care provider. Document Revised: 04/08/2017 Document Reviewed: 11/19/2016 Elsevier Patient Education  2020 Elsevier Inc.  

## 2019-12-06 ENCOUNTER — Ambulatory Visit: Payer: Medicare PPO | Admitting: Anesthesiology

## 2019-12-06 ENCOUNTER — Ambulatory Visit
Admission: RE | Admit: 2019-12-06 | Discharge: 2019-12-06 | Disposition: A | Discharge status: Home / Self Care | Payer: Medicare PPO | Attending: Gastroenterology | Admitting: Gastroenterology

## 2019-12-06 ENCOUNTER — Encounter: Payer: Self-pay | Admitting: Gastroenterology

## 2019-12-06 ENCOUNTER — Other Ambulatory Visit: Payer: Self-pay | Admitting: Gastroenterology

## 2019-12-06 ENCOUNTER — Other Ambulatory Visit: Payer: Self-pay

## 2019-12-06 ENCOUNTER — Encounter
Admission: RE | Disposition: A | Discharge status: Home / Self Care | Payer: Self-pay | Source: Home / Self Care | Attending: Gastroenterology

## 2019-12-06 DIAGNOSIS — Z8673 Personal history of transient ischemic attack (TIA), and cerebral infarction without residual deficits: Secondary | ICD-10-CM | POA: Diagnosis not present

## 2019-12-06 DIAGNOSIS — D124 Benign neoplasm of descending colon: Secondary | ICD-10-CM | POA: Insufficient documentation

## 2019-12-06 DIAGNOSIS — K5730 Diverticulosis of large intestine without perforation or abscess without bleeding: Secondary | ICD-10-CM | POA: Diagnosis not present

## 2019-12-06 DIAGNOSIS — Z85528 Personal history of other malignant neoplasm of kidney: Secondary | ICD-10-CM | POA: Insufficient documentation

## 2019-12-06 DIAGNOSIS — Z860100 Personal history of colon polyps, unspecified: Secondary | ICD-10-CM

## 2019-12-06 DIAGNOSIS — E785 Hyperlipidemia, unspecified: Secondary | ICD-10-CM | POA: Diagnosis not present

## 2019-12-06 DIAGNOSIS — Z1211 Encounter for screening for malignant neoplasm of colon: Secondary | ICD-10-CM | POA: Insufficient documentation

## 2019-12-06 DIAGNOSIS — K640 First degree hemorrhoids: Secondary | ICD-10-CM | POA: Diagnosis not present

## 2019-12-06 DIAGNOSIS — Z905 Acquired absence of kidney: Secondary | ICD-10-CM | POA: Insufficient documentation

## 2019-12-06 DIAGNOSIS — N189 Chronic kidney disease, unspecified: Secondary | ICD-10-CM | POA: Diagnosis not present

## 2019-12-06 DIAGNOSIS — D126 Benign neoplasm of colon, unspecified: Secondary | ICD-10-CM | POA: Diagnosis not present

## 2019-12-06 DIAGNOSIS — Z79899 Other long term (current) drug therapy: Secondary | ICD-10-CM | POA: Diagnosis not present

## 2019-12-06 DIAGNOSIS — K635 Polyp of colon: Secondary | ICD-10-CM | POA: Insufficient documentation

## 2019-12-06 DIAGNOSIS — I129 Hypertensive chronic kidney disease with stage 1 through stage 4 chronic kidney disease, or unspecified chronic kidney disease: Secondary | ICD-10-CM | POA: Insufficient documentation

## 2019-12-06 DIAGNOSIS — Z8249 Family history of ischemic heart disease and other diseases of the circulatory system: Secondary | ICD-10-CM | POA: Diagnosis not present

## 2019-12-06 DIAGNOSIS — Z8546 Personal history of malignant neoplasm of prostate: Secondary | ICD-10-CM | POA: Insufficient documentation

## 2019-12-06 HISTORY — PX: POLYPECTOMY: SHX5525

## 2019-12-06 HISTORY — PX: COLONOSCOPY WITH PROPOFOL: SHX5780

## 2019-12-06 SURGERY — COLONOSCOPY WITH PROPOFOL
Anesthesia: General

## 2019-12-06 MED ORDER — SODIUM CHLORIDE 0.9 % IV SOLN: INTRAVENOUS | Status: DC

## 2019-12-06 MED ORDER — STERILE WATER FOR IRRIGATION IR SOLN
Status: DC | PRN
  Administered 2019-12-06: 100 mL

## 2019-12-06 MED ORDER — PROPOFOL 10 MG/ML IV BOLUS
INTRAVENOUS | Status: DC | PRN
  Administered 2019-12-06: 40 mg via INTRAVENOUS
  Administered 2019-12-06: 120 mg via INTRAVENOUS
  Administered 2019-12-06: 40 mg via INTRAVENOUS

## 2019-12-06 MED ORDER — LIDOCAINE HCL (CARDIAC) PF 100 MG/5ML IV SOSY
PREFILLED_SYRINGE | INTRAVENOUS | Status: DC | PRN
  Administered 2019-12-06: 30 mg via INTRAVENOUS

## 2019-12-06 MED ORDER — LACTATED RINGERS IV SOLN: INTRAVENOUS | Status: DC

## 2019-12-06 SURGICAL SUPPLY — 24 items
CLIP HMST 235XBRD CATH ROT (MISCELLANEOUS) IMPLANT
CLIP RESOLUTION 360 11X235 (MISCELLANEOUS)
ELECT REM PT RETURN 9FT ADLT (ELECTROSURGICAL)
ELECTRODE REM PT RTRN 9FT ADLT (ELECTROSURGICAL) IMPLANT
FCP ESCP3.2XJMB 240X2.8X (MISCELLANEOUS)
FORCEPS BIOP RAD 4 LRG CAP 4 (CUTTING FORCEPS) IMPLANT
FORCEPS BIOP RJ4 240 W/NDL (MISCELLANEOUS)
FORCEPS ESCP3.2XJMB 240X2.8X (MISCELLANEOUS) IMPLANT
GOWN CVR UNV OPN BCK APRN NK (MISCELLANEOUS) ×2 IMPLANT
GOWN ISOL THUMB LOOP REG UNIV (MISCELLANEOUS) ×4
INJECTOR VARIJECT VIN23 (MISCELLANEOUS) IMPLANT
KIT DEFENDO VALVE AND CONN (KITS) IMPLANT
KIT ENDO PROCEDURE OLY (KITS) ×2 IMPLANT
MANIFOLD NEPTUNE II (INSTRUMENTS) ×2 IMPLANT
MARKER SPOT ENDO TATTOO 5ML (MISCELLANEOUS) IMPLANT
PROBE APC STR FIRE (PROBE) IMPLANT
RETRIEVER NET ROTH 2.5X230 LF (MISCELLANEOUS) IMPLANT
SNARE SHORT THROW 13M SML OVAL (MISCELLANEOUS) IMPLANT
SNARE SHORT THROW 30M LRG OVAL (MISCELLANEOUS) ×2 IMPLANT
SNARE SNG USE RND 15MM (INSTRUMENTS) IMPLANT
SPOT EX ENDOSCOPIC TATTOO (MISCELLANEOUS)
TRAP ETRAP POLY (MISCELLANEOUS) IMPLANT
VARIJECT INJECTOR VIN23 (MISCELLANEOUS)
WATER STERILE IRR 250ML POUR (IV SOLUTION) ×2 IMPLANT

## 2019-12-06 NOTE — Op Note (Signed)
[facility] Gastroenterology Patient Name: Jason Krause Procedure Date: 12/06/2019 8:45 AM MRN: [id number] Account #: [id number] Date of Birth: [birth year] Admit Type: Outpatient Age: 74 Room: [facility] OR ROOM 01 Gender: Male Note Status: Finalized Procedure:             Colonoscopy Indications:           High risk colon cancer surveillance: Personal history                         of colonic polyps Providers:             Lucilla Lame MD, MD Referring MD:          Juline Patch, MD (Referring MD) Medicines:             Propofol per Anesthesia Complications:         No immediate complications. Procedure:             Pre-Anesthesia Assessment:                        - Prior to the procedure, a History and Physical was                         performed, and patient medications and allergies were                         reviewed. The patient's tolerance of previous                         anesthesia was also reviewed. The risks and benefits                         of the procedure and the sedation options and risks                         were discussed with the patient. All questions were                         answered, and informed consent was obtained. Prior                         Anticoagulants: The patient has taken no previous                         anticoagulant or antiplatelet agents. ASA Grade                         Assessment: II - A patient with mild systemic disease.                         After reviewing the risks and benefits, the patient                         was deemed in satisfactory condition to undergo the                         procedure.  After obtaining informed consent, the colonoscope was                         passed under direct vision. Throughout the procedure,                         the patient's blood pressure, pulse, and oxygen                         saturations were monitored continuously. The was                          introduced through the anus and advanced to the the                         cecum, identified by appendiceal orifice and ileocecal                         valve. The colonoscopy was performed without                         difficulty. The patient tolerated the procedure well.                         The quality of the bowel preparation was excellent. Findings:      The perianal and digital rectal examinations were normal.      A 2 mm polyp was found in the descending colon. The polyp was sessile.       The polyp was removed with a cold biopsy forceps. Resection and       retrieval were complete.      A 2 mm polyp was found in the sigmoid colon. The polyp was sessile. The       polyp was removed with a cold biopsy forceps. Resection and retrieval       were complete.      Non-bleeding internal hemorrhoids were found during retroflexion. The       hemorrhoids were Grade I (internal hemorrhoids that do not prolapse).      Many small-mouthed diverticula were found in the sigmoid colon. Impression:            - One 2 mm polyp in the descending colon, removed with                         a cold biopsy forceps. Resected and retrieved.                        - One 2 mm polyp in the sigmoid colon, removed with a                         cold biopsy forceps. Resected and retrieved.                        - Non-bleeding internal hemorrhoids.                        - Diverticulosis in the sigmoid colon. Recommendation:        - Discharge patient to home.                        -  Resume previous diet.                        - Continue present medications.                        - Await pathology results. Procedure Code(s):     --- Professional ---                        [phone], Colonoscopy, flexible; with biopsy, single or                         multiple Diagnosis Code(s):     --- Professional ---                        Z86.010, Personal history of colonic polyps                         K63.5, Polyp of colon CPT copyright 2019 American Medical Association. All rights reserved. The codes documented in this report are preliminary and upon coder review may  be revised to meet current compliance requirements. Lucilla Lame MD, MD 12/06/2019 9:10:43 AM This report has been signed electronically. Number of Addenda: 0 Note Initiated On: 12/06/2019 8:45 AM Scope Withdrawal Time: 0 hours 7 minutes 34 seconds  Total Procedure Duration: 0 hours 11 minutes 33 seconds  Estimated Blood Loss:  Estimated blood loss: none.      [facility]

## 2019-12-06 NOTE — Anesthesia Preprocedure Evaluation (Signed)
Anesthesia Evaluation  Patient identified by MRN, date of birth, ID band Patient awake    Reviewed: Allergy & Precautions, H&P , NPO status , Patient's Chart, lab work & pertinent test results  Airway Mallampati: II  TM Distance: >3 FB Neck ROM: full    Dental no notable dental hx.    Pulmonary    Pulmonary exam normal breath sounds clear to auscultation       Cardiovascular hypertension, Normal cardiovascular exam Rhythm:regular Rate:Normal     Neuro/Psych CVA, Residual Symptoms    GI/Hepatic   Endo/Other    Renal/GU Renal disease     Musculoskeletal   Abdominal   Peds  Hematology   Anesthesia Other Findings   Reproductive/Obstetrics                             Anesthesia Physical Anesthesia Plan  ASA: III  Anesthesia Plan: General   Post-op Pain Management:    Induction: Intravenous  PONV Risk Score and Plan: 2 and Treatment may vary due to age or medical condition, Propofol infusion and TIVA  Airway Management Planned: Natural Airway  Additional Equipment:   Intra-op Plan:   Post-operative Plan:   Informed Consent: I have reviewed the patients History and Physical, chart, labs and discussed the procedure including the risks, benefits and alternatives for the proposed anesthesia with the patient or authorized representative who has indicated his/her understanding and acceptance.     Dental Advisory Given  Plan Discussed with: CRNA  Anesthesia Plan Comments:         Anesthesia Quick Evaluation

## 2019-12-06 NOTE — Anesthesia Postprocedure Evaluation (Signed)
Anesthesia Post Note  Patient: Jason Krause  Procedure(s) Performed: COLONOSCOPY and biopsy WITH PROPOFOL (N/A ) POLYPECTOMY (N/A )     Patient location during evaluation: PACU Anesthesia Type: General Level of consciousness: awake and alert and oriented Pain management: satisfactory to patient Vital Signs Assessment: post-procedure vital signs reviewed and stable Respiratory status: spontaneous breathing, nonlabored ventilation and respiratory function stable Cardiovascular status: blood pressure returned to baseline and stable Postop Assessment: Adequate PO intake and No signs of nausea or vomiting Anesthetic complications: no   No complications documented.  Raliegh Ip

## 2019-12-06 NOTE — H&P (Signed)
Jason Lame, MD [address], [place] [address] [phone] Fax : [phone]  Primary Care Physician:  Juline Patch, MD Primary Gastroenterologist:  Dr. Allen Norris  Pre-Procedure History & Physical: HPI:  Jason Krause is a 74 y.o. male is here for an colonoscopy.   Past Medical History:  Diagnosis Date  . 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 Hyperlipidemia   . Hypertension   . Prostate cancer ([place])   . Stroke ([place])    slight weakness in right foot    Past Surgical History:  Procedure Laterality Date  . HERNIA REPAIR    . kidney removal due to cancer Right   . PROSTATE SURGERY     removed    Prior to Admission medications   Medication Sig Start Date End Date Taking? Authorizing Provider  Coenzyme Q10 100 MG capsule Take 1 capsule by mouth daily.   Yes [provider]  folic acid (FOLVITE) 097 MCG tablet Take 1 tablet by mouth daily.   Yes [provider]  Glucos-Chondroit-Hyaluron-MSM (GLUCOSAMINE CHONDROITIN JOINT PO) Take 2 tablets by mouth daily.   Yes [provider]  lisinopril (ZESTRIL) 20 MG tablet Take 1 tablet (20 mg total) by mouth daily. 11/06/19  Yes Juline Patch, MD  Multiple Vitamins-Minerals (CENTRUM SILVER PO) Take 1 tablet by mouth daily.   Yes [provider]  propranolol ER (INDERAL LA) 60 MG 24 hr capsule Take 3 capsules (180 mg total) by mouth daily. 2 capsules in am and 1 in the pm 11/06/19  Yes Otilio Miu C, MD  rosuvastatin (CRESTOR) 10 MG tablet TAKE (1) TABLET BY MOUTH EVERY DAY 11/06/19  Yes Juline Patch, MD    Allergies as of 11/14/2019 - Review Complete 11/14/2019  Allergen Reaction Noted  . Carvedilol Other (See Comments) 10/24/2019    Family History  Problem Relation Age of Onset  . Cancer Father   . Heart disease Father   . Diabetes Sister   . Stroke Maternal Grandmother   . Heart  disease Paternal Grandmother     Social History   Socioeconomic History  . Marital status: Married    Spouse name: Not on file  . Number of children: Not on file  . Years of education: Not on file  . Highest education level: Not on file  Occupational History  . Not on file  Tobacco Use  . Smoking status: Never Smoker  . Smokeless tobacco: Never Used  Substance and Sexual Activity  . Alcohol use: Yes    Comment: social  . Drug use: Never  . Sexual activity: Not Currently  Other Topics Concern  . Not on file  Social History Narrative  . Not on file   Social Determinants of Health   Financial Resource Strain:   . Difficulty of Paying Living Expenses: Not on file  Food Insecurity:   . Worried About Charity fundraiser in the Last Year: Not on file  . Ran Out of Food in the Last Year: Not on file  Transportation Needs:   . Lack of Transportation (Medical): Not on file  . Lack of Transportation (Non-Medical): Not on file  Physical Activity:   . Days of Exercise per Week: Not on file  . Minutes of Exercise per Session: Not on file  Stress:   . Feeling of Stress : Not on  file  Social Connections:   . Frequency of Communication with Friends and Family: Not on file  . Frequency of Social Gatherings with Friends and Family: Not on file  . Attends Religious Services: Not on file  . Active Member of Clubs or Organizations: Not on file  . Attends Archivist Meetings: Not on file  . Marital Status: Not on file  Intimate Partner Violence:   . Fear of Current or Ex-Partner: Not on file  . Emotionally Abused: Not on file  . Physically Abused: Not on file  . Sexually Abused: Not on file    Review of Systems: See HPI, otherwise negative ROS  Physical Exam: BP (!) 132/93   Pulse 74   Temp 97.7 F (36.5 C) (Temporal)   Resp 16   Ht 5\' 9"  (1.753 m)   Wt 82.6 kg   SpO2 97%   BMI 26.89 kg/m  General:   Alert,  pleasant and cooperative in NAD Head:  Normocephalic  and atraumatic. Neck:  Supple; no masses or thyromegaly. Lungs:  Clear throughout to auscultation.    Heart:  Regular rate and rhythm. Abdomen:  Soft, nontender and nondistended. Normal bowel sounds, without guarding, and without rebound.   Neurologic:  Alert and  oriented x4;  grossly normal neurologically.  Impression/Plan: Jason Krause is here for an colonoscopy to be performed for a history of adenomatous polyps on 12/2014   Risks, benefits, limitations, and alternatives regarding  colonoscopy have been reviewed with the patient.  Questions have been answered.  All parties agreeable.   Jason Lame, MD  12/06/2019, 8:45 AM

## 2019-12-06 NOTE — Anesthesia Procedure Notes (Signed)
Date/Time: 12/06/2019 8:52 AM Performed by: Cameron Ali, CRNA Pre-anesthesia Checklist: Patient identified, Emergency Drugs available, Suction available, Timeout performed and Patient being monitored Patient Re-evaluated:Patient Re-evaluated prior to induction Oxygen Delivery Method: Nasal cannula Placement Confirmation: positive ETCO2

## 2019-12-06 NOTE — Transfer of Care (Signed)
Immediate Anesthesia Transfer of Care Note  Patient: Jason Krause  Procedure(s) Performed: COLONOSCOPY and biopsy WITH PROPOFOL (N/A ) POLYPECTOMY (N/A )  Patient Location: PACU  Anesthesia Type: General  Level of Consciousness: awake, alert  and patient cooperative  Airway and Oxygen Therapy: Patient Spontanous Breathing and Patient connected to supplemental oxygen  Post-op Assessment: Post-op Vital signs reviewed, Patient's Cardiovascular Status Stable, Respiratory Function Stable, Patent Airway and No signs of Nausea or vomiting  Post-op Vital Signs: Reviewed and stable  Complications: No complications documented.

## 2019-12-07 ENCOUNTER — Encounter: Payer: Self-pay | Admitting: Gastroenterology

## 2019-12-07 LAB — SURGICAL PATHOLOGY

## 2019-12-10 ENCOUNTER — Encounter: Payer: Self-pay | Admitting: Gastroenterology

## 2019-12-11 LAB — PATHOLOGY

## 2019-12-17 DIAGNOSIS — R262 Difficulty in walking, not elsewhere classified: Secondary | ICD-10-CM | POA: Diagnosis not present

## 2019-12-17 DIAGNOSIS — M6281 Muscle weakness (generalized): Secondary | ICD-10-CM | POA: Diagnosis not present

## 2020-01-09 DIAGNOSIS — G252 Other specified forms of tremor: Secondary | ICD-10-CM | POA: Diagnosis not present

## 2020-01-21 DIAGNOSIS — R262 Difficulty in walking, not elsewhere classified: Secondary | ICD-10-CM | POA: Diagnosis not present

## 2020-01-21 DIAGNOSIS — M6281 Muscle weakness (generalized): Secondary | ICD-10-CM | POA: Diagnosis not present

## 2020-02-13 DIAGNOSIS — Z872 Personal history of diseases of the skin and subcutaneous tissue: Secondary | ICD-10-CM | POA: Diagnosis not present

## 2020-02-13 DIAGNOSIS — L578 Other skin changes due to chronic exposure to nonionizing radiation: Secondary | ICD-10-CM | POA: Diagnosis not present

## 2020-02-13 DIAGNOSIS — Z85820 Personal history of malignant melanoma of skin: Secondary | ICD-10-CM | POA: Diagnosis not present

## 2020-02-13 DIAGNOSIS — L918 Other hypertrophic disorders of the skin: Secondary | ICD-10-CM | POA: Diagnosis not present

## 2020-02-13 DIAGNOSIS — Z85828 Personal history of other malignant neoplasm of skin: Secondary | ICD-10-CM | POA: Diagnosis not present

## 2020-02-13 DIAGNOSIS — L570 Actinic keratosis: Secondary | ICD-10-CM | POA: Diagnosis not present

## 2020-02-18 DIAGNOSIS — R278 Other lack of coordination: Secondary | ICD-10-CM | POA: Diagnosis not present

## 2020-02-18 DIAGNOSIS — R251 Tremor, unspecified: Secondary | ICD-10-CM | POA: Diagnosis not present

## 2020-02-18 DIAGNOSIS — M6281 Muscle weakness (generalized): Secondary | ICD-10-CM | POA: Diagnosis not present

## 2020-02-18 DIAGNOSIS — R262 Difficulty in walking, not elsewhere classified: Secondary | ICD-10-CM | POA: Diagnosis not present

## 2020-02-25 DIAGNOSIS — M6281 Muscle weakness (generalized): Secondary | ICD-10-CM | POA: Diagnosis not present

## 2020-02-25 DIAGNOSIS — R251 Tremor, unspecified: Secondary | ICD-10-CM | POA: Diagnosis not present

## 2020-02-25 DIAGNOSIS — R278 Other lack of coordination: Secondary | ICD-10-CM | POA: Diagnosis not present

## 2020-02-25 DIAGNOSIS — R262 Difficulty in walking, not elsewhere classified: Secondary | ICD-10-CM | POA: Diagnosis not present

## 2020-03-26 ENCOUNTER — Other Ambulatory Visit: Payer: Self-pay

## 2020-03-26 ENCOUNTER — Ambulatory Visit (INDEPENDENT_AMBULATORY_CARE_PROVIDER_SITE_OTHER): Payer: Medicare PPO

## 2020-03-26 VITALS — BP 140/90 | HR 57 | Temp 98.1°F | Resp 16 | Ht 69.0 in | Wt 193.6 lb

## 2020-03-26 DIAGNOSIS — Z0000 Encounter for general adult medical examination without abnormal findings: Secondary | ICD-10-CM | POA: Diagnosis not present

## 2020-03-26 NOTE — Progress Notes (Signed)
Subjective:   Jason Krause is a 74 y.o. male who presents for an Initial Medicare Annual Wellness Visit.  Review of Systems     Cardiac Risk Factors include: advanced age (>15men, >38 women);dyslipidemia;male gender;hypertension     Objective:    Today's Vitals   03/26/20 0817  BP: 140/90  Pulse: (!) 57  Resp: 16  Temp: 98.1 F (36.7 C)  TempSrc: Oral  SpO2: 96%  Weight: 193 lb 9.6 oz (87.8 kg)  Height: 5\' 9"  (1.753 m)   Body mass index is 28.59 kg/m.  Advanced Directives 03/26/2020 12/06/2019  Does Patient Have a Medical Advance Directive? No No  Would patient like information on creating a medical advance directive? No - Patient declined No - Patient declined    Current Medications (verified) Outpatient Encounter Medications as of 03/26/2020  Medication Sig  . Coenzyme Q10 100 MG capsule Take 1 capsule by mouth daily.  . folic acid (FOLVITE) 497 MCG tablet Take 1 tablet by mouth daily.  . Glucos-Chondroit-Hyaluron-MSM (GLUCOSAMINE CHONDROITIN JOINT PO) Take 2 tablets by mouth daily.  Marland Kitchen lisinopril (ZESTRIL) 20 MG tablet Take 1 tablet (20 mg total) by mouth daily.  . Multiple Vitamins-Minerals (CENTRUM SILVER PO) Take 1 tablet by mouth daily.  . propranolol ER (INDERAL LA) 60 MG 24 hr capsule Take 3 capsules (180 mg total) by mouth daily. 2 capsules in am and 1 in the pm  . rosuvastatin (CRESTOR) 10 MG tablet TAKE (1) TABLET BY MOUTH EVERY DAY   No facility-administered encounter medications on file as of 03/26/2020.    Allergies (verified) Carvedilol   History: Past Medical History:  Diagnosis Date  . Cancer [facility])    kidney and prostate  . 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 Hyperlipidemia   . Hypertension   . Prostate cancer ([place])   . Stroke ([place])    slight weakness in right foot   Past Surgical History:  Procedure Laterality Date  . COLONOSCOPY WITH PROPOFOL N/A 12/06/2019   Procedure: COLONOSCOPY and biopsy WITH  PROPOFOL;  Surgeon: Lucilla Lame, MD;  Location: [place];  Service: Endoscopy;  Laterality: N/A;  priority 4  . HERNIA REPAIR    . kidney removal due to cancer Right   . POLYPECTOMY N/A 12/06/2019   Procedure: POLYPECTOMY;  Surgeon: Lucilla Lame, MD;  Location: [place];  Service: Endoscopy;  Laterality: N/A;  . PROSTATE SURGERY     removed   Family History  Problem Relation Age of Onset  . Cancer Father   . Heart disease Father   . Diabetes Sister   . Stroke Maternal Grandmother   . Heart disease Paternal Grandmother    Social History   Socioeconomic History  . Marital status: Married    Spouse name: Not on file  . Number of children: 0  . Years of education: Not on file  . Highest education level: Not on file  Occupational History  . Not on file  Tobacco Use  . Smoking status: Never Smoker  . Smokeless tobacco: Never Used  Vaping Use  . Vaping Use: Never used  Substance and Sexual Activity  . Alcohol use: Yes    Comment: social  . Drug use: Never  . Sexual activity: Not Currently  Other Topics Concern  . Not on file  Social History Narrative  . Not on file   Social Determinants of Health   Financial Resource Strain: Low Risk   .  Difficulty of Paying Living Expenses: Not hard at all  Food Insecurity: No Food Insecurity  . Worried About Charity fundraiser in the Last Year: Never true  . Ran Out of Food in the Last Year: Never true  Transportation Needs: No Transportation Needs  . Lack of Transportation (Medical): No  . Lack of Transportation (Non-Medical): No  Physical Activity: Inactive  . Days of Exercise per Week: 0 days  . Minutes of Exercise per Session: 0 min  Stress: No Stress Concern Present  . Feeling of Stress : Not at all  Social Connections: Moderately Isolated  . Frequency of Communication with Friends and Family: More than three times a week  . Frequency of Social Gatherings with Friends and Family: Three times a week  .  Attends Religious Services: Never  . Active Member of Clubs or Organizations: No  . Attends Archivist Meetings: Never  . Marital Status: Married    Tobacco Counseling Counseling given: Not Answered   Clinical Intake:  Pre-visit preparation completed: Yes  Pain : No/denies pain     BMI - recorded: 28.59 Nutritional Status: BMI 25 -29 Overweight Nutritional Risks: None Diabetes: No  How often do you need to have someone help you when you read instructions, pamphlets, or other written materials from your doctor or pharmacy?: 1 - Never    Interpreter Needed?: No  Information entered by :: Clemetine Marker LPN   Activities of Daily Living In your present state of health, do you have any difficulty performing the following activities: 03/26/2020 12/06/2019  Hearing? Y N  Comment declines hearing aids -  Vision? N N  Difficulty concentrating or making decisions? N N  Walking or climbing stairs? N N  Dressing or bathing? N N  Doing errands, shopping? N -  Preparing Food and eating ? N -  Using the Toilet? N -  In the past six months, have you accidently leaked urine? N -  Do you have problems with loss of bowel control? N -  Managing your Medications? N -  Managing your Finances? N -  Housekeeping or managing your Housekeeping? N -    Patient Care Team: Juline Patch, MD as PCP - General (Family Medicine)  Indicate any recent Medical Services you may have received from other than [facility] providers in the past year (date may be approximate).     Assessment:   This is a routine wellness examination for Alekzander.  Hearing/Vision screen  Hearing Screening   125Hz  250Hz  500Hz  1000Hz  2000Hz  3000Hz  4000Hz  6000Hz  8000Hz   Right ear:           Left ear:           Comments: Pt c/o mild hearing difficulty; reports hearing evaluation approx 1 yr ago and does not required hearing aids   Vision Screening Comments: Annual vision screenings done at [place] issues and exercise activities discussed: Current Exercise Habits: The patient does not participate in regular exercise at present, Exercise limited by: orthopedic condition(s)  Goals    . Increase physical activity     Recommend increasing physical activity to at least 90 minutes per week      Depression Screen PHQ 2/9 Scores 03/26/2020 11/06/2019 05/08/2019  PHQ - 2 Score 0 0 0  PHQ- 9 Score - 0 0    Fall Risk Fall Risk  03/26/2020 11/06/2019 05/08/2019  Falls in the past year? 1 0 1  Number falls in past yr:  1 - 1  Injury with Fall? 0 - 0  Risk for fall due to : History of fall(s) - Impaired balance/gait  Follow up Falls prevention discussed Falls evaluation completed Falls evaluation completed    [place]:  Any stairs in or around the home? Yes  If so, are there any without handrails? No  Home free of loose throw rugs in walkways, pet beds, electrical cords, etc? Yes  Adequate lighting in your home to reduce risk of falls? Yes   ASSISTIVE DEVICES UTILIZED TO PREVENT FALLS:  Life alert? No  Use of a cane, walker or w/c? No  Grab bars in the bathroom? No  Shower chair or bench in shower? No  Elevated toilet seat or a handicapped toilet? No   TIMED UP AND GO:  Was the test performed? Yes .  Length of time to ambulate 10 feet: 5 sec.   Gait steady and fast without use of assistive device  Cognitive Function: Normal cognitive status assessed by direct observation by this Nurse Health Advisor. No abnormalities found.          Immunizations Immunization History  Administered Date(s) Administered  . Influenza,inj,Quad PF,6+ Mos 02/18/2019  . Influenza,inj,quad, With Preservative 12/18/2017  . Influenza-Unspecified 04/19/2018  . Moderna SARS-COVID-2 Vaccination 05/25/2019, 06/26/2019  . Pneumococcal Conjugate-13 04/19/2017  . Pneumococcal Polysaccharide-23 05/08/2019  . Tdap 04/19/2017    TDAP status: Up to  date  Flu Vaccine status: Up to date  Pneumococcal vaccine status: Up to date  Covid-19 vaccine status: Completed vaccines  Qualifies for Shingles Vaccine? Yes   Zostavax completed Yes   Shingrix Completed?: No.    Education has been provided regarding the importance of this vaccine. Patient has been advised to call insurance company to determine out of pocket expense if they have not yet received this vaccine. Advised may also receive vaccine at local pharmacy or Health Dept. Verbalized acceptance and understanding.  Screening Tests Health Maintenance  Topic Date Due  . INFLUENZA VACCINE  11/18/2019  . Hepatitis C Screening  05/07/2020 (Originally [birth year])  . COLONOSCOPY  12/05/2024  . TETANUS/TDAP  04/20/2027  . COVID-19 Vaccine  Completed  . PNA vac Low Risk Adult  Completed    Health Maintenance  Health Maintenance Due  Topic Date Due  . INFLUENZA VACCINE  11/18/2019    Colorectal cancer screening: Type of screening: Colonoscopy. Completed 12/06/19. Repeat every 5 years  Lung Cancer Screening: (Low Dose CT Chest recommended if Age 36-80 years, 30 pack-year currently smoking OR have quit w/in 15years.) does not qualify.   Additional Screening:  Hepatitis C Screening: does qualify; postponed  Vision Screening: Recommended annual ophthalmology exams for early detection of glaucoma and other disorders of the eye. Is the patient up to date with their annual eye exam?  Yes  Who is the provider or what is the name of the office in which the patient attends annual eye exams? Dr. Ellin Mayhew  Dental Screening: Recommended annual dental exams for proper oral hygiene  Community Resource Referral / Chronic Care Management: CRR required this visit?  No   CCM required this visit?  No      Plan:     I have personally reviewed and noted the following in the patient's chart:   . Medical and social history . Use of alcohol, tobacco or illicit drugs  . Current medications and  supplements . Functional ability and status . Nutritional status . Physical activity . Advanced directives .  List of other physicians . Hospitalizations, surgeries, and ER visits in previous 12 months . Vitals . Screenings to include cognitive, depression, and falls . Referrals and appointments  In addition, I have reviewed and discussed with patient certain preventive protocols, quality metrics, and best practice recommendations. A written personalized care plan for preventive services as well as general preventive health recommendations were provided to patient.     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 Pt was released from outpatient PT 03/18/20. Pt advised to continue exercises at home and discuss with Dr. Ronnald Ramp at next visit.

## 2020-03-26 NOTE — Patient Instructions (Signed)
[name] , Thank you for taking time to come for your Medicare Wellness Visit. I appreciate your ongoing commitment to your health goals. Please review the following plan we discussed and let me know if I can assist you in the future.   Screening recommendations/referrals: Colonoscopy: done 12/06/19 Recommended yearly ophthalmology/optometry visit for glaucoma screening and checkup Recommended yearly dental visit for hygiene and checkup  Vaccinations: Influenza vaccine: done; will call [facility] for vaccine information Pneumococcal vaccine: done 05/08/19 Tdap vaccine: done 2019 Shingles vaccine: Shingrix discussed. Please contact your pharmacy for coverage information.    Covid-19: done 05/25/19 & 06/26/19  Advanced directives: Advance directive discussed with you today. Even though you declined this today please call our office should you change your mind and we can give you the proper paperwork for you to fill out.  Conditions/risks identified: Recommend increasing physical activity and fall prevention in the home  Next appointment: Follow up in one year for your annual wellness visit.   Preventive Care 73 Years and Older, Male Preventive care refers to lifestyle choices and visits with your health care provider that can promote health and wellness. What does preventive care include?  A yearly physical exam. This is also called an annual well check.  Dental exams once or twice a year.  Routine eye exams. Ask your health care provider how often you should have your eyes checked.  Personal lifestyle choices, including:  Daily care of your teeth and gums.  Regular physical activity.  Eating a healthy diet.  Avoiding tobacco and drug use.  Limiting alcohol use.  Practicing safe sex.  Taking low doses of aspirin every day.  Taking vitamin and mineral supplements as recommended by your health care provider. What happens during an annual well check? The services and  screenings done by your health care provider during your annual well check will depend on your age, overall health, lifestyle risk factors, and family history of disease. Counseling  Your health care provider may ask you questions about your:  Alcohol use.  Tobacco use.  Drug use.  Emotional well-being.  Home and relationship well-being.  Sexual activity.  Eating habits.  History of falls.  Memory and ability to understand (cognition).  Work and work Statistician. Screening  You may have the following tests or measurements:  Height, weight, and BMI.  Blood pressure.  Lipid and cholesterol levels. These may be checked every 5 years, or more frequently if you are over 28 years old.  Skin check.  Lung cancer screening. You may have this screening every year starting at age 73 if you have a 30-pack-year history of smoking and currently smoke or have quit within the past 15 years.  Fecal occult blood test (FOBT) of the stool. You may have this test every year starting at age 73.  Flexible sigmoidoscopy or colonoscopy. You may have a sigmoidoscopy every 5 years or a colonoscopy every 10 years starting at age 73.  Prostate cancer screening. Recommendations will vary depending on your family history and other risks.  Hepatitis C blood test.  Hepatitis B blood test.  Sexually transmitted disease (STD) testing.  Diabetes screening. This is done by checking your blood sugar (glucose) after you have not eaten for a while (fasting). You may have this done every 1-3 years.  Abdominal aortic aneurysm (AAA) screening. You may need this if you are a current or former smoker.  Osteoporosis. You may be screened starting at age 47 if you are at high risk.  Talk with your health care provider about your test results, treatment options, and if necessary, the need for more tests. Vaccines  Your health care provider may recommend certain vaccines, such as:  Influenza vaccine. This is  recommended every year.  Tetanus, diphtheria, and acellular pertussis (Tdap, Td) vaccine. You may need a Td booster every 10 years.  Zoster vaccine. You may need this after age 44.  Pneumococcal 13-valent conjugate (PCV13) vaccine. One dose is recommended after age 73.  Pneumococcal polysaccharide (PPSV23) vaccine. One dose is recommended after age 73. Talk to your health care provider about which screenings and vaccines you need and how often you need them. This information is not intended to replace advice given to you by your health care provider. Make sure you discuss any questions you have with your health care provider. Document Released: 05/02/2015 Document Revised: 12/24/2015 Document Reviewed: 02/04/2015 Elsevier Interactive Patient Education  2017 [employer] Prevention in the Home Falls can cause injuries. They can happen to people of all ages. There are many things you can do to make your home safe and to help prevent falls. What can I do on the outside of my home?  Regularly fix the edges of walkways and driveways and fix any cracks.  Remove anything that might make you trip as you walk through a door, such as a raised step or threshold.  Trim any bushes or trees on the path to your home.  Use bright outdoor lighting.  Clear any walking paths of anything that might make someone trip, such as rocks or tools.  Regularly check to see if handrails are loose or broken. Make sure that both sides of any steps have handrails.  Any raised decks and porches should have guardrails on the edges.  Have any leaves, snow, or ice cleared regularly.  Use sand or salt on walking paths during winter.  Clean up any spills in your garage right away. This includes oil or grease spills. What can I do in the bathroom?  Use night lights.  Install grab bars by the toilet and in the tub and shower. Do not use towel bars as grab bars.  Use non-skid mats or decals in the tub or  shower.  If you need to sit down in the shower, use a plastic, non-slip stool.  Keep the floor dry. Clean up any water that spills on the floor as soon as it happens.  Remove soap buildup in the tub or shower regularly.  Attach bath mats securely with double-sided non-slip rug tape.  Do not have throw rugs and other things on the floor that can make you trip. What can I do in the bedroom?  Use night lights.  Make sure that you have a light by your bed that is easy to reach.  Do not use any sheets or blankets that are too big for your bed. They should not hang down onto the floor.  Have a firm chair that has side arms. You can use this for support while you get dressed.  Do not have throw rugs and other things on the floor that can make you trip. What can I do in the kitchen?  Clean up any spills right away.  Avoid walking on wet floors.  Keep items that you use a lot in easy-to-reach places.  If you need to reach something above you, use a strong step stool that has a grab bar.  Keep electrical cords out of the way.  Do not use floor polish or wax that makes floors slippery. If you must use wax, use non-skid floor wax.  Do not have throw rugs and other things on the floor that can make you trip. What can I do with my stairs?  Do not leave any items on the stairs.  Make sure that there are handrails on both sides of the stairs and use them. Fix handrails that are broken or loose. Make sure that handrails are as long as the stairways.  Check any carpeting to make sure that it is firmly attached to the stairs. Fix any carpet that is loose or worn.  Avoid having throw rugs at the top or bottom of the stairs. If you do have throw rugs, attach them to the floor with carpet tape.  Make sure that you have a light switch at the top of the stairs and the bottom of the stairs. If you do not have them, ask someone to add them for you. What else can I do to help prevent  falls?  Wear shoes that:  Do not have high heels.  Have rubber bottoms.  Are comfortable and fit you well.  Are closed at the toe. Do not wear sandals.  If you use a stepladder:  Make sure that it is fully opened. Do not climb a closed stepladder.  Make sure that both sides of the stepladder are locked into place.  Ask someone to hold it for you, if possible.  Clearly mark and make sure that you can see:  Any grab bars or handrails.  First and last steps.  Where the edge of each step is.  Use tools that help you move around (mobility aids) if they are needed. These include:  Canes.  Walkers.  Scooters.  Crutches.  Turn on the lights when you go into a dark area. Replace any light bulbs as soon as they burn out.  Set up your furniture so you have a clear path. Avoid moving your furniture around.  If any of your floors are uneven, fix them.  If there are any pets around you, be aware of where they are.  Review your medicines with your doctor. Some medicines can make you feel dizzy. This can increase your chance of falling. Ask your doctor what other things that you can do to help prevent falls. This information is not intended to replace advice given to you by your health care provider. Make sure you discuss any questions you have with your health care provider. Document Released: 01/30/2009 Document Revised: 09/11/2015 Document Reviewed: 05/10/2014 Elsevier Interactive Patient Education  2017 [employer].

## 2020-03-27 DIAGNOSIS — H2513 Age-related nuclear cataract, bilateral: Secondary | ICD-10-CM | POA: Diagnosis not present

## 2020-04-01 DIAGNOSIS — R251 Tremor, unspecified: Secondary | ICD-10-CM | POA: Diagnosis not present

## 2020-04-01 DIAGNOSIS — R262 Difficulty in walking, not elsewhere classified: Secondary | ICD-10-CM | POA: Diagnosis not present

## 2020-04-01 DIAGNOSIS — R278 Other lack of coordination: Secondary | ICD-10-CM | POA: Diagnosis not present

## 2020-04-01 DIAGNOSIS — F488 Other specified nonpsychotic mental disorders: Secondary | ICD-10-CM | POA: Diagnosis not present

## 2020-04-01 DIAGNOSIS — M6281 Muscle weakness (generalized): Secondary | ICD-10-CM | POA: Diagnosis not present

## 2020-04-15 DIAGNOSIS — R251 Tremor, unspecified: Secondary | ICD-10-CM | POA: Diagnosis not present

## 2020-04-15 DIAGNOSIS — M6281 Muscle weakness (generalized): Secondary | ICD-10-CM | POA: Diagnosis not present

## 2020-04-15 DIAGNOSIS — F488 Other specified nonpsychotic mental disorders: Secondary | ICD-10-CM | POA: Diagnosis not present

## 2020-04-15 DIAGNOSIS — R278 Other lack of coordination: Secondary | ICD-10-CM | POA: Diagnosis not present

## 2020-04-15 DIAGNOSIS — R262 Difficulty in walking, not elsewhere classified: Secondary | ICD-10-CM | POA: Diagnosis not present

## 2020-04-23 ENCOUNTER — Encounter: Payer: Self-pay | Admitting: Family Medicine

## 2020-04-23 ENCOUNTER — Ambulatory Visit (INDEPENDENT_AMBULATORY_CARE_PROVIDER_SITE_OTHER): Payer: Medicare PPO | Admitting: Family Medicine

## 2020-04-23 ENCOUNTER — Other Ambulatory Visit: Payer: Self-pay

## 2020-04-23 VITALS — BP 122/82 | HR 60 | Ht 69.0 in | Wt 190.0 lb

## 2020-04-23 DIAGNOSIS — M6281 Muscle weakness (generalized): Secondary | ICD-10-CM

## 2020-04-23 DIAGNOSIS — R1012 Left upper quadrant pain: Secondary | ICD-10-CM | POA: Diagnosis not present

## 2020-04-23 DIAGNOSIS — G4739 Other sleep apnea: Secondary | ICD-10-CM

## 2020-04-23 DIAGNOSIS — E782 Mixed hyperlipidemia: Secondary | ICD-10-CM | POA: Diagnosis not present

## 2020-04-23 DIAGNOSIS — N1831 Chronic kidney disease, stage 3a: Secondary | ICD-10-CM | POA: Diagnosis not present

## 2020-04-23 DIAGNOSIS — I10 Essential (primary) hypertension: Secondary | ICD-10-CM | POA: Diagnosis not present

## 2020-04-23 MED ORDER — ROSUVASTATIN CALCIUM 10 MG PO TABS: ORAL_TABLET | ORAL | 1 refills | Status: DC

## 2020-04-23 MED ORDER — PROPRANOLOL HCL ER 60 MG PO CP24: 180.0000 mg | ORAL_CAPSULE | Freq: Every day | ORAL | 1 refills | Status: DC

## 2020-04-23 MED ORDER — LISINOPRIL 20 MG PO TABS: 20.0000 mg | ORAL_TABLET | Freq: Every day | ORAL | 1 refills | Status: DC

## 2020-04-23 NOTE — Progress Notes (Signed)
Date:  04/23/2020   Name:  Jason Krause   DOB:  [birth year]   MRN:  [id number]   Chief Complaint: Hypertension (Sees nephrologist in August) and Hyperlipidemia  Patient is a 75 year old male who presents for a face to face sleep apnea exam. The patient reports the following problems: snoring/apneic episodes. Health maintenance has been reviewed up to date.  Hypertension This is a chronic problem. The current episode started more than 1 year ago. The problem has been gradually improving since onset. The problem is controlled. Pertinent negatives include no anxiety, blurred vision, chest pain, headaches, malaise/fatigue, neck pain, orthopnea, palpitations, peripheral edema, PND, shortness of breath or sweats. (Day time somnolence) There are no associated agents to hypertension. Risk factors for coronary artery disease include dyslipidemia. Past treatments include ACE inhibitors. The current treatment provides moderate improvement. There are no compliance problems.  There is no history of angina, kidney disease, CAD/MI, CVA, heart failure, left ventricular hypertrophy, PVD or retinopathy. There is no history of chronic renal disease, a hypertension causing med or renovascular disease.  Hyperlipidemia This is a chronic problem. The current episode started more than 1 year ago. The problem is controlled. Recent lipid tests were reviewed and are normal. He has no history of chronic renal disease. Pertinent negatives include no chest pain, myalgias or shortness of breath. Current antihyperlipidemic treatment includes statins. The current treatment provides moderate improvement of lipids. There are no compliance problems.  Risk factors for coronary artery disease include hypertension and dyslipidemia.    Lab Results  Component Value Date   CREATININE 1.46 (H) 11/06/2019   BUN 18 11/06/2019   NA 143 11/06/2019   K 5.4 (H) 11/06/2019   CL 103 11/06/2019   CO2 27 11/06/2019   Lab Results  Component  Value Date   CHOL 149 11/06/2019   HDL 41 11/06/2019   LDLCALC 93 11/06/2019   TRIG 75 11/06/2019   CHOLHDL 3.6 11/06/2019   No results found for: TSH No results found for: HGBA1C No results found for: WBC, HGB, HCT, MCV, PLT Lab Results  Component Value Date   ALT 15 11/06/2019   AST 23 11/06/2019   ALKPHOS 98 11/06/2019   BILITOT 1.0 11/06/2019     Review of Systems  Constitutional: Negative for chills, fever and malaise/fatigue.  HENT: Negative for drooling, ear discharge, ear pain and sore throat.   Eyes: Negative for blurred vision.  Respiratory: Negative for cough, shortness of breath and wheezing.   Cardiovascular: Negative for chest pain, palpitations, orthopnea, leg swelling and PND.  Gastrointestinal: Positive for abdominal pain. Negative for blood in stool, constipation, diarrhea and nausea.  Endocrine: Negative for polydipsia.  Genitourinary: Negative for dysuria, frequency, hematuria and urgency.  Musculoskeletal: Negative for back pain, myalgias and neck pain.  Skin: Negative for rash.  Allergic/Immunologic: Negative for environmental allergies.  Neurological: Positive for weakness. Negative for dizziness and headaches.  Hematological: Does not bruise/bleed easily.  Psychiatric/Behavioral: Negative for suicidal ideas. The patient is not nervous/anxious.     Patient Active Problem List   Diagnosis Date Noted  . Personal history of colonic polyps   . Polyp of descending colon   . CKD (chronic kidney disease) stage 3, GFR 30-59 ml/min (HCC) 12/28/2017  . Osteopenia 12/28/2017  . Polyp of gallbladder 12/28/2017  . Right bundle branch block 12/28/2017  . SAH (subarachnoid hemorrhage) (HCC) 11/11/2016  . Essential hypertension 11/09/2016  . Mixed hyperlipidemia 11/08/2016  . 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 Personal history of renal cell carcinoma 11/08/2016  . Cerebrovascular accident ([place]) 10/18/2016  .  History of kidney cancer 01/19/2007  . 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[place]) 01/12/2001    Allergies  Allergen Reactions  . Carvedilol Other (See Comments)    Past Surgical History:  Procedure Laterality Date  . COLONOSCOPY WITH PROPOFOL N/A 12/06/2019   Procedure: COLONOSCOPY and biopsy WITH PROPOFOL;  Surgeon: Lucilla Lame, MD;  Location: [facility];  Service: Endoscopy;  Laterality: N/A;  priority 4  . HERNIA REPAIR    . kidney removal due to cancer Right   . POLYPECTOMY N/A 12/06/2019   Procedure: POLYPECTOMY;  Surgeon: Lucilla Lame, MD;  Location: [facility];  Service: Endoscopy;  Laterality: N/A;  . PROSTATE SURGERY     removed    Social History   Tobacco Use  . Smoking status: Never Smoker  . Smokeless tobacco: Never Used  Vaping Use  . Vaping Use: Never used  Substance Use Topics  . Alcohol use: Yes    Comment: social  . Drug use: Never     Medication list has been reviewed and updated.  Current Meds  Medication Sig  . Coenzyme Q10 100 MG capsule Take 1 capsule by mouth daily.  . folic acid (FOLVITE) A999333 MCG tablet Take 1 tablet by mouth daily.  . Glucos-Chondroit-Hyaluron-MSM (GLUCOSAMINE CHONDROITIN JOINT PO) Take 2 tablets by mouth daily.  Marland Kitchen lisinopril (ZESTRIL) 20 MG tablet Take 1 tablet (20 mg total) by mouth daily.  . Multiple Vitamins-Minerals (CENTRUM SILVER PO) Take 1 tablet by mouth daily.  . propranolol ER (INDERAL LA) 60 MG 24 hr capsule Take 3 capsules (180 mg total) by mouth daily. 2 capsules in am and 1 in the pm  . rosuvastatin (CRESTOR) 10 MG tablet TAKE (1) TABLET BY MOUTH EVERY DAY    PHQ 2/9 Scores 04/23/2020 03/26/2020 11/06/2019 05/08/2019  PHQ - 2 Score 0 0 0 0  PHQ- 9 Score 0 - 0 0    GAD 7 : Generalized Anxiety Score 11/06/2019 05/08/2019  Nervous, Anxious, on Edge 0 0  Control/stop worrying 0 0  Worry too much - different things 0 0  Trouble relaxing 0 0  Restless 0 0  Easily annoyed or irritable 0 0  Afraid - awful might  happen 0 0  Total GAD 7 Score 0 0    BP Readings from Last 3 Encounters:  04/23/20 122/82  03/26/20 140/90  12/06/19 111/71    Physical Exam Vitals and nursing note reviewed.  HENT:     Head: Normocephalic.     Right Ear: External ear normal.     Left Ear: External ear normal.     Nose: Nose normal.     Mouth/Throat:     Mouth: Oropharynx is clear and moist.  Eyes:     General: No scleral icterus.       Right eye: No discharge.        Left eye: No discharge.     Extraocular Movements: EOM normal.     Conjunctiva/sclera: Conjunctivae normal.     Pupils: Pupils are equal, round, and reactive to light.  Neck:     Thyroid: No thyromegaly.     Vascular: No JVD.     Trachea: No tracheal deviation.  Cardiovascular:     Rate and Rhythm: Normal rate and regular rhythm.     Pulses: Intact distal pulses.     Heart sounds: Normal heart sounds. No murmur  heard. No friction rub. No gallop.   Pulmonary:     Effort: No respiratory distress.     Breath sounds: Normal breath sounds. No wheezing or rales.  Abdominal:     General: Bowel sounds are normal.     Palpations: Abdomen is soft. There is no hepatosplenomegaly or mass.     Tenderness: There is no abdominal tenderness. There is no CVA tenderness, guarding or rebound.  Musculoskeletal:        General: No tenderness or edema. Normal range of motion.     Cervical back: Normal range of motion and neck supple.  Lymphadenopathy:     Cervical: No cervical adenopathy.  Skin:    General: Skin is warm.     Findings: No rash.  Neurological:     Mental Status: He is alert and oriented to person, place, and time.     Cranial Nerves: No cranial nerve deficit.     Deep Tendon Reflexes: Strength normal and reflexes are normal and symmetric.     Wt Readings from Last 3 Encounters:  04/23/20 190 lb (86.2 kg)  03/26/20 193 lb 9.6 oz (87.8 kg)  12/06/19 182 lb 1.6 oz (82.6 kg)    BP 122/82   Pulse 60   Ht 5\' 9"  (1.753 m)   Wt 190 lb  (86.2 kg)   BMI 28.06 kg/m   Assessment and Plan: 1. Essential hypertension Chronic.  Controlled.  Stable.  Blood pressure 122/82.  Continue months of lisinopril 20 mg once a day and propranolol ER 60 mg 3 capsules total dose 180 mg once a day.  Will check renal function panel. - lisinopril (ZESTRIL) 20 MG tablet; Take 1 tablet (20 mg total) by mouth daily.  Dispense: 90 tablet; Refill: 1 - propranolol ER (INDERAL LA) 60 MG 24 hr capsule; Take 3 capsules (180 mg total) by mouth daily. 2 capsules in am and 1 in the pm  Dispense: 270 capsule; Refill: 1 - Renal Function Panel  2. Mixed hyperlipidemia .  Controlled.  Stable.  Continue rosuvastatin 10 mg once a day.  Will check lipid panel. - rosuvastatin (CRESTOR) 10 MG tablet; TAKE (1) TABLET BY MOUTH EVERY DAY  Dispense: 90 tablet; Refill: 1 - Lipid Panel With LDL/HDL Ratio  3. Sleep apnea-like behavior New problem.  Persistent and ongoing.  Relatively stable but needs further evaluation.  Patient brings up that he is having significant snoring with apneic episodes as witnessed by his spouse.  He has significant drowsiness particularly when sitting in chair watching TV.  Patient has been given an Epworth scale and we have referred to ENT for evaluation and for possible referral for sleep study. - Ambulatory referral to ENT  4. Left upper quadrant abdominal pain New onset.  Persistent patient has a fullness or discomfort when he wakes up in the morning in the left flank left upper quadrant.  There is no palpable splenomegaly and I suspect this is an oblique muscle concern or perhaps an intercostal muscle in the lower levels of the ribs.  Patient will be returning for reevaluation after evaluation for sleep study.  5. Proximal muscle weakness New onset as noted above we will check CK and sed rate at this time.  Persistent.  Patient has noticed a feeling of weakness in his legs when he tries to stand particularly in the quadriceps.  Patient has  demonstrated a difficulty in standing without use of arms for pushoff.  We will check a CK and sed rate initially  and patient is returning for further evaluation in 6 weeks. - CK (Creatine Kinase) - Sedimentation rate  6. Stage 3a chronic kidney disease ([place]) Patient has a history of renal function concerns and is followed by nephrology.  Will check renal function panel for GFR and creatinine/BUN. - Renal Function Panel

## 2020-04-24 LAB — LIPID PANEL WITH LDL/HDL RATIO
Cholesterol, Total: 181 mg/dL (ref 100–199)
HDL: 45 mg/dL (ref >39)
LDL Chol Calc (NIH): 123 mg/dL — ABNORMAL HIGH (ref 0–99)
LDL/HDL Ratio: 2.7 ratio (ref 0.0–3.6)
Triglycerides: 69 mg/dL (ref 0–149)
VLDL Cholesterol Cal: 13 mg/dL (ref 5–40)

## 2020-04-24 LAB — RENAL FUNCTION PANEL
Albumin: 4.3 g/dL (ref 3.7–4.7)
BUN/Creatinine Ratio: 13 (ref 10–24)
BUN: 18 mg/dL (ref 8–27)
CO2: 24 mmol/L (ref 20–29)
Calcium: 10.4 mg/dL — ABNORMAL HIGH (ref 8.6–10.2)
Chloride: 106 mmol/L (ref 96–106)
Creatinine, Ser: 1.43 mg/dL — ABNORMAL HIGH (ref 0.76–1.27)
GFR calc Af Amer: 56 mL/min/{1.73_m2} — ABNORMAL LOW (ref >59)
GFR calc non Af Amer: 48 mL/min/{1.73_m2} — ABNORMAL LOW (ref >59)
Glucose: 89 mg/dL (ref 65–99)
Phosphorus: 3.3 mg/dL (ref 2.8–4.1)
Potassium: 4.5 mmol/L (ref 3.5–5.2)
Sodium: 148 mmol/L — ABNORMAL HIGH (ref 134–144)

## 2020-04-24 LAB — CK: Total CK: 53 U/L (ref 41–331)

## 2020-04-24 LAB — SEDIMENTATION RATE: Sed Rate: 2 mm/hr (ref 0–30)

## 2020-04-25 ENCOUNTER — Encounter: Payer: Self-pay | Admitting: Family Medicine

## 2020-05-12 DIAGNOSIS — H6123 Impacted cerumen, bilateral: Secondary | ICD-10-CM | POA: Diagnosis not present

## 2020-05-12 DIAGNOSIS — G4733 Obstructive sleep apnea (adult) (pediatric): Secondary | ICD-10-CM | POA: Diagnosis not present

## 2020-05-15 DIAGNOSIS — Z8679 Personal history of other diseases of the circulatory system: Secondary | ICD-10-CM | POA: Diagnosis not present

## 2020-05-15 DIAGNOSIS — M79604 Pain in right leg: Secondary | ICD-10-CM | POA: Diagnosis not present

## 2020-05-15 DIAGNOSIS — I610 Nontraumatic intracerebral hemorrhage in hemisphere, subcortical: Secondary | ICD-10-CM | POA: Diagnosis not present

## 2020-05-15 DIAGNOSIS — Z09 Encounter for follow-up examination after completed treatment for conditions other than malignant neoplasm: Secondary | ICD-10-CM | POA: Diagnosis not present

## 2020-05-15 DIAGNOSIS — R29898 Other symptoms and signs involving the musculoskeletal system: Secondary | ICD-10-CM | POA: Diagnosis not present

## 2020-05-15 DIAGNOSIS — M79605 Pain in left leg: Secondary | ICD-10-CM | POA: Diagnosis not present

## 2020-05-19 ENCOUNTER — Other Ambulatory Visit: Payer: Self-pay

## 2020-05-19 DIAGNOSIS — M6281 Muscle weakness (generalized): Secondary | ICD-10-CM

## 2020-05-19 NOTE — Progress Notes (Signed)
Put referral in for [facility] neuro

## 2020-05-20 DIAGNOSIS — R262 Difficulty in walking, not elsewhere classified: Secondary | ICD-10-CM | POA: Diagnosis not present

## 2020-05-20 DIAGNOSIS — R251 Tremor, unspecified: Secondary | ICD-10-CM | POA: Diagnosis not present

## 2020-05-20 DIAGNOSIS — G4730 Sleep apnea, unspecified: Secondary | ICD-10-CM | POA: Diagnosis not present

## 2020-05-20 DIAGNOSIS — R278 Other lack of coordination: Secondary | ICD-10-CM | POA: Diagnosis not present

## 2020-05-20 DIAGNOSIS — M6281 Muscle weakness (generalized): Secondary | ICD-10-CM | POA: Diagnosis not present

## 2020-05-20 DIAGNOSIS — F488 Other specified nonpsychotic mental disorders: Secondary | ICD-10-CM | POA: Diagnosis not present

## 2020-05-28 ENCOUNTER — Telehealth: Payer: Self-pay

## 2020-05-28 NOTE — Telephone Encounter (Signed)
Copied from [place] [id number]. Topic: Quick Sport and exercise psychologist Patient (Clinic Use ONLY) >> May 28, 2020  7:19 AM Lennox Solders wrote: Reason for CRM: Pt is calling and just missed a call from office

## 2020-06-03 ENCOUNTER — Telehealth: Payer: Self-pay

## 2020-06-03 NOTE — Telephone Encounter (Signed)
Pt called in stating that Dr Josie Dixon office has not received the referral. Lexine Baton is going to refax it.

## 2020-06-06 ENCOUNTER — Telehealth: Payer: Self-pay

## 2020-06-06 NOTE — Telephone Encounter (Signed)
Spoke to patient and gave message.

## 2020-06-06 NOTE — Telephone Encounter (Signed)
Please call and tell him I have provided them with the new fax number and they have refaxed

## 2020-06-06 NOTE — Telephone Encounter (Unsigned)
Copied from [place] [id number]. Topic: General - Other >> Jun 05, 2020  5:31 PM Yvette Rack wrote: Reason for CRM: Pt requested to speak with Baxter Flattery. Pt provided fax# [phone] to [facility]. Pt asked that a message be sent to Baxter Flattery with this fax#

## 2020-06-10 ENCOUNTER — Encounter: Payer: Self-pay | Admitting: Family Medicine

## 2020-06-10 ENCOUNTER — Other Ambulatory Visit: Payer: Self-pay

## 2020-06-10 ENCOUNTER — Ambulatory Visit (INDEPENDENT_AMBULATORY_CARE_PROVIDER_SITE_OTHER): Payer: Medicare PPO | Admitting: Family Medicine

## 2020-06-10 VITALS — BP 120/88 | HR 64 | Ht 69.0 in | Wt 184.0 lb

## 2020-06-10 DIAGNOSIS — I739 Peripheral vascular disease, unspecified: Secondary | ICD-10-CM

## 2020-06-10 DIAGNOSIS — M6281 Muscle weakness (generalized): Secondary | ICD-10-CM | POA: Diagnosis not present

## 2020-06-10 NOTE — Progress Notes (Addendum)
Date:  06/10/2020   Name:  Jason Krause   DOB:  06/08/1945   MRN:  [id number]   Chief Complaint: Extremity Weakness ([facility] doctor said he needs vascular appt for "claudication" and ortho for "spinal stenosis")  Extremity Weakness  The pain is present in the right upper leg, left upper leg, right lower leg and left lower leg. This is a chronic (began years ago) problem. The problem occurs intermittently. The problem has been gradually worsening. The quality of the pain is described as aching. The pain is at a severity of 8/10. The pain is moderate. Pertinent negatives include no fever. The symptoms are aggravated by activity. He has tried rest for the symptoms.  Leg Pain  The incident occurred more than 1 week ago. There was no injury mechanism. The pain has been intermittent since onset. 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    Lab Results  Component Value Date   CREATININE 1.43 (H) 04/23/2020   BUN 18 04/23/2020   NA 148 (H) 04/23/2020   K 4.5 04/23/2020   CL 106 04/23/2020   CO2 24 04/23/2020   Lab Results  Component Value Date   CHOL 181 04/23/2020   HDL 45 04/23/2020   LDLCALC 123 (H) 04/23/2020   TRIG 69 04/23/2020   CHOLHDL 3.6 11/06/2019   No results found for: TSH No results found for: HGBA1C No results found for: WBC, HGB, HCT, MCV, PLT Lab Results  Component Value Date   ALT 15 11/06/2019   AST 23 11/06/2019   ALKPHOS 98 11/06/2019   BILITOT 1.0 11/06/2019     Review of Systems  Constitutional: Negative for chills and fever.  HENT: Negative for drooling, ear discharge, ear pain and sore throat.   Respiratory: Negative for cough, shortness of breath and  wheezing.   Cardiovascular: Negative for chest pain, palpitations and leg swelling.  Gastrointestinal: Negative for abdominal pain, blood in stool, constipation, diarrhea and nausea.  Endocrine: Negative for polydipsia.  Genitourinary: Negative for dysuria, frequency, hematuria and urgency.  Musculoskeletal: Positive for extremity weakness. Negative for back pain, myalgias and neck pain.  Skin: Negative for rash.  Allergic/Immunologic: Negative for environmental allergies.  Neurological: Negative for dizziness and headaches.  Hematological: Does not bruise/bleed easily.  Psychiatric/Behavioral: Negative for suicidal ideas. The patient is not nervous/anxious.     Patient Active Problem List   Diagnosis Date Noted  . Personal history of colonic polyps   . Polyp of descending colon   . CKD (chronic kidney disease) stage 3, GFR 30-59 ml/min (HCC) 12/28/2017  . Osteopenia 12/28/2017  . Polyp of gallbladder 12/28/2017  . Right bundle branch block 12/28/2017  . SAH (subarachnoid hemorrhage) ([place]) 11/11/2016  . Essential hypertension 11/09/2016  . Mixed hyperlipidemia 11/08/2016  . Nontraumatic cortical hemorrhage of left cerebral hemisphere ([place]) 11/08/2016  . Personal history of renal cell carcinoma 11/08/2016  . Cerebrovascular accident ([place]) 10/18/2016  . History of kidney cancer 01/19/2007  . 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 Carvedilol Other (See Comments)    Past Surgical History:  Procedure Laterality Date  . COLONOSCOPY WITH PROPOFOL N/A 12/06/2019   Procedure: COLONOSCOPY  and biopsy WITH PROPOFOL;  Surgeon: Lucilla Lame, MD;  Location: [place];  Service: Endoscopy;  Laterality: N/A;  priority 4  . HERNIA REPAIR    . kidney removal due to cancer Right   . POLYPECTOMY N/A 12/06/2019   Procedure: POLYPECTOMY;  Surgeon: Lucilla Lame, MD;  Location: [place];  Service: Endoscopy;  Laterality: N/A;  . PROSTATE  SURGERY     removed    Social History   Tobacco Use  . Smoking status: Never Smoker  . Smokeless tobacco: Never Used  Vaping Use  . Vaping Use: Never used  Substance Use Topics  . Alcohol use: Yes    Comment: social  . Drug use: Never     Medication list has been reviewed and updated.  Current Meds  Medication Sig  . Coenzyme Q10 100 MG capsule Take 1 capsule by mouth daily.  . folic acid (FOLVITE) 510 MCG tablet Take 1 tablet by mouth daily.  . Glucos-Chondroit-Hyaluron-MSM (GLUCOSAMINE CHONDROITIN JOINT PO) Take 2 tablets by mouth daily.  Marland Kitchen lisinopril (ZESTRIL) 20 MG tablet Take 1 tablet (20 mg total) by mouth daily.  . Multiple Vitamins-Minerals (CENTRUM SILVER PO) Take 1 tablet by mouth daily.  . propranolol ER (INDERAL LA) 60 MG 24 hr capsule Take 3 capsules (180 mg total) by mouth daily. 2 capsules in am and 1 in the pm  . rosuvastatin (CRESTOR) 10 MG tablet TAKE (1) TABLET BY MOUTH EVERY DAY    PHQ 2/9 Scores 04/23/2020 03/26/2020 11/06/2019 05/08/2019  PHQ - 2 Score 0 0 0 0  PHQ- 9 Score 0 - 0 0    GAD 7 : Generalized Anxiety Score 11/06/2019 05/08/2019  Nervous, Anxious, on Edge 0 0  Control/stop worrying 0 0  Worry too much - different things 0 0  Trouble relaxing 0 0  Restless 0 0  Easily annoyed or irritable 0 0  Afraid - awful might happen 0 0  Total GAD 7 Score 0 0    BP Readings from Last 3 Encounters:  06/10/20 120/88  04/23/20 122/82  03/26/20 140/90    Physical Exam Vitals and nursing note reviewed.  HENT:     Head: Normocephalic.     Right Ear: Tympanic membrane, ear canal and external ear normal.     Left Ear: Tympanic membrane, ear canal and external ear normal.     Nose: Nose normal. No congestion or rhinorrhea.     Mouth/Throat:     Mouth: Oropharynx is clear and moist. Mucous membranes are moist.  Eyes:     General: No scleral icterus.       Right eye: No discharge.        Left eye: No discharge.     Extraocular Movements: EOM normal.      Conjunctiva/sclera: Conjunctivae normal.     Pupils: Pupils are equal, round, and reactive to light.  Neck:     Thyroid: No thyromegaly.     Vascular: No JVD.     Trachea: No tracheal deviation.  Cardiovascular:     Rate and Rhythm: Normal rate and regular rhythm.     Pulses: Intact distal pulses.          Femoral pulses are 2+ on the right side and 2+ on the left side.      Dorsalis pedis pulses are 0 on the right side and 0 on the left side.       Posterior tibial pulses are 0 on the right side and  1+ on the left side.     Heart sounds: Normal heart sounds. No murmur heard. No friction rub. No gallop.   Pulmonary:     Effort: No respiratory distress.     Breath sounds: Normal breath sounds. No wheezing, rhonchi or rales.  Abdominal:     General: Abdomen is flat. Bowel sounds are normal.     Palpations: Abdomen is soft. There is no hepatosplenomegaly or mass.     Tenderness: There is no abdominal tenderness. There is no CVA tenderness, guarding or rebound.  Musculoskeletal:        General: No tenderness or edema. Normal range of motion.     Cervical back: Normal range of motion and neck supple.  Lymphadenopathy:     Cervical: No cervical adenopathy.  Skin:    General: Skin is warm.     Capillary Refill: Capillary refill takes less than 2 seconds.     Findings: No rash.  Neurological:     Mental Status: He is alert and oriented to person, place, and time.     Cranial Nerves: No cranial nerve deficit.     Deep Tendon Reflexes: Strength normal and reflexes are normal and symmetric.     Wt Readings from Last 3 Encounters:  06/10/20 184 lb (83.5 kg)  04/23/20 190 lb (86.2 kg)  03/26/20 193 lb 9.6 oz (87.8 kg)    BP 120/88   Pulse 64   Ht 5\' 9"  (1.753 m)   Wt 184 lb (83.5 kg)   BMI 27.17 kg/m   Assessment and Plan: 1. Proximal muscle weakness New onset.  Persistent.  Waxes and wanes any activity.  Patient has noticed increasing proximal muscle weakness that his  legs just "give out".  Neurologic exam is normal but there is no lower back pain but there is some concern of a physician at [facility] about spinal stenosis and whether or not this is an issue we will refer to orthopedics at [facility] clinic for evaluation to see if this is likely etiology for his symptomatology. - Ambulatory referral to Orthopedic Surgery  2. 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  Patient has 2+ femoral pulses noted bilateral but there is a decrease in the peripheral pulses which I am able to palpate a 1+ posterior left tibial but unable to get either DPs or the right posterior tibial.  We will refer to vascular for evaluation thereafter. - Ambulatory referral to Vascular Surgery

## 2020-06-18 ENCOUNTER — Other Ambulatory Visit: Payer: Self-pay | Admitting: Orthopedic Surgery

## 2020-06-18 ENCOUNTER — Other Ambulatory Visit (INDEPENDENT_AMBULATORY_CARE_PROVIDER_SITE_OTHER): Payer: Self-pay | Admitting: Vascular Surgery

## 2020-06-18 DIAGNOSIS — M5442 Lumbago with sciatica, left side: Secondary | ICD-10-CM | POA: Diagnosis not present

## 2020-06-18 DIAGNOSIS — M5136 Other intervertebral disc degeneration, lumbar region: Secondary | ICD-10-CM | POA: Diagnosis not present

## 2020-06-18 DIAGNOSIS — I739 Peripheral vascular disease, unspecified: Secondary | ICD-10-CM

## 2020-06-18 DIAGNOSIS — M4807 Spinal stenosis, lumbosacral region: Secondary | ICD-10-CM | POA: Diagnosis not present

## 2020-06-18 DIAGNOSIS — G8929 Other chronic pain: Secondary | ICD-10-CM | POA: Diagnosis not present

## 2020-06-18 DIAGNOSIS — M5441 Lumbago with sciatica, right side: Secondary | ICD-10-CM | POA: Diagnosis not present

## 2020-06-18 DIAGNOSIS — M545 Low back pain, unspecified: Secondary | ICD-10-CM | POA: Diagnosis not present

## 2020-06-19 ENCOUNTER — Other Ambulatory Visit: Payer: Self-pay

## 2020-06-19 ENCOUNTER — Ambulatory Visit (INDEPENDENT_AMBULATORY_CARE_PROVIDER_SITE_OTHER): Payer: Medicare PPO

## 2020-06-19 ENCOUNTER — Encounter (INDEPENDENT_AMBULATORY_CARE_PROVIDER_SITE_OTHER): Payer: Self-pay | Admitting: Vascular Surgery

## 2020-06-19 ENCOUNTER — Ambulatory Visit (INDEPENDENT_AMBULATORY_CARE_PROVIDER_SITE_OTHER): Payer: Medicare PPO | Admitting: Vascular Surgery

## 2020-06-19 VITALS — BP 110/79 | HR 60 | Ht 70.0 in | Wt 194.0 lb

## 2020-06-19 DIAGNOSIS — I10 Essential (primary) hypertension: Secondary | ICD-10-CM | POA: Diagnosis not present

## 2020-06-19 DIAGNOSIS — I739 Peripheral vascular disease, unspecified: Secondary | ICD-10-CM | POA: Diagnosis not present

## 2020-06-19 DIAGNOSIS — M79605 Pain in left leg: Secondary | ICD-10-CM | POA: Diagnosis not present

## 2020-06-19 DIAGNOSIS — M79604 Pain in right leg: Secondary | ICD-10-CM | POA: Diagnosis not present

## 2020-06-19 DIAGNOSIS — E782 Mixed hyperlipidemia: Secondary | ICD-10-CM | POA: Diagnosis not present

## 2020-06-20 DIAGNOSIS — G4733 Obstructive sleep apnea (adult) (pediatric): Secondary | ICD-10-CM | POA: Diagnosis not present

## 2020-06-22 ENCOUNTER — Encounter (INDEPENDENT_AMBULATORY_CARE_PROVIDER_SITE_OTHER): Payer: Self-pay | Admitting: Vascular Surgery

## 2020-06-22 DIAGNOSIS — M79606 Pain in leg, unspecified: Secondary | ICD-10-CM | POA: Insufficient documentation

## 2020-06-22 NOTE — Progress Notes (Signed)
MRN : [id number]  Jason Krause is a 75 y.o. (1945/05/09) male who presents with chief complaint of  Chief Complaint  Patient presents with  . New Patient (Initial Visit)    Ronnald Ramp. Claudication of both LE U/S   .  History of Present Illness:   The patient is seen for evaluation of painful lower extremities. Patient notes the pain is variable and not always associated with activity.  The pain is somewhat consistent day to day occurring on most days. The patient notes the pain also occurs with standing and routinely seems worse as the day wears on. The pain has been progressive over the past several years. The patient states these symptoms are causing  a profound negative impact on quality of life and daily activities.  The patient denies rest pain or dangling of an extremity off the side of the bed during the night for relief. No open wounds or sores at this time. No history of DVT or phlebitis. No prior interventions or surgeries.  There is a  history of back problems and DJD of the lumbar and sacral spine.   ABI's obtained today are normal bilaterally  Current Meds  Medication Sig  . Coenzyme Q10 100 MG capsule Take 1 capsule by mouth daily.  . folic acid (FOLVITE) 852 MCG tablet Take 1 tablet by mouth daily.  . Glucos-Chondroit-Hyaluron-MSM (GLUCOSAMINE CHONDROITIN JOINT PO) Take 2 tablets by mouth daily.  Marland Kitchen lisinopril (ZESTRIL) 20 MG tablet Take 1 tablet (20 mg total) by mouth daily.  . Multiple Vitamins-Minerals (CENTRUM SILVER PO) Take 1 tablet by mouth daily.  . propranolol ER (INDERAL LA) 60 MG 24 hr capsule Take 3 capsules (180 mg total) by mouth daily. 2 capsules in am and 1 in the pm  . rosuvastatin (CRESTOR) 10 MG tablet TAKE (1) TABLET BY MOUTH EVERY DAY    Past Medical History:  Diagnosis Date  . [facility])    kidney and prostate  . 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 Hyperlipidemia   . Hypertension   . Prostate cancer  ([place])   . Stroke ([place])    slight weakness in right foot    Past Surgical History:  Procedure Laterality Date  . COLONOSCOPY WITH PROPOFOL N/A 12/06/2019   Procedure: COLONOSCOPY and biopsy WITH PROPOFOL;  Surgeon: Lucilla Lame, MD;  Location: [facility];  Service: Endoscopy;  Laterality: N/A;  priority 4  . HERNIA REPAIR    . kidney removal due to cancer Right   . POLYPECTOMY N/A 12/06/2019   Procedure: POLYPECTOMY;  Surgeon: Lucilla Lame, MD;  Location: [place];  Service: Endoscopy;  Laterality: N/A;  . PROSTATE SURGERY     removed    Social History Social History   Tobacco Use  . Smoking status: Never Smoker  . Smokeless tobacco: Never Used  Vaping Use  . Vaping Use: Never used  Substance Use Topics  . Alcohol use: Yes    Comment: social  . Drug use: Never    Family History Family History  Problem Relation Age of Onset  . Cancer Father   . Heart disease Father   . Diabetes Sister   . Stroke Maternal Grandmother   . Heart disease Paternal Grandmother   No family history of bleeding/clotting disorders, porphyria or autoimmune disease   Allergies  Allergen Reactions  . Carvedilol Other (See Comments)     REVIEW OF SYSTEMS (Negative unless checked)  Constitutional: [] Weight loss  [] Fever  [] Chills Cardiac: [] Chest pain   [] Chest pressure   [] Palpitations   [] Shortness of breath when laying flat   [] Shortness of breath with exertion. Vascular:  [x] Pain in legs with walking   [x] Pain in legs at rest  [] History of DVT   [] Phlebitis   [] Swelling in legs   [] Varicose veins   [] Non-healing ulcers Pulmonary:   [] Uses home oxygen   [] Productive cough   [] Hemoptysis   [] Wheeze  [] COPD   [] Asthma Neurologic:  [] Dizziness   [] Seizures   [] History of stroke   [] History of TIA  [] Aphasia   [] Vissual changes   [] Weakness or numbness in arm   [] Weakness or numbness in leg Musculoskeletal:   [] Joint swelling   [] Joint pain   [] Low back pain Hematologic:   [] Easy bruising  [] Easy bleeding   [] Hypercoagulable state   [] Anemic Gastrointestinal:  [] Diarrhea   [] Vomiting  [] Gastroesophageal reflux/heartburn   [] Difficulty swallowing. Genitourinary:  [] Chronic kidney disease   [] Difficult urination  [] Frequent urination   [] Blood in urine Skin:  [] Rashes   [] Ulcers  Psychological:  [] History of anxiety   []  History of major depression.  Physical Examination  Vitals:   06/19/20 1440  BP: 110/79  Pulse: 60  Weight: 194 lb (88 kg)  Height: 5\' 10"  (1.778 m)   Body mass index is 27.84 kg/m. Gen: WD/WN, NAD Head: [place]/AT, No temporalis wasting.  Ear/Nose/Throat: Hearing grossly intact, nares w/o erythema or drainage, poor dentition Eyes: PER, EOMI, sclera nonicteric.  Neck: Supple, no masses.  No bruit or JVD.  Pulmonary:  Good air movement, clear to auscultation bilaterally, no use of accessory muscles.  Cardiac: RRR, normal S1, S2, no Murmurs. Vascular:  Vessel Right Left  Radial Palpable Palpable  PT Trace Palpable Trace Palpable  DP 1+ Palpable 1+ Palpable  Gastrointestinal: soft, non-distended. No guarding/no peritoneal signs.  Musculoskeletal: M/S 5/5 throughout.  No deformity or atrophy.  Neurologic: CN 2-12 intact. Pain and light touch intact in extremities.  Symmetrical.  Speech is fluent. Motor exam as listed above. Psychiatric: Judgment intact, Mood & affect appropriate for pt's clinical situation. Dermatologic: No rashes or ulcers noted.  No changes consistent with cellulitis.   CBC No results found for: WBC, HGB, HCT, MCV, PLT  BMET    Component Value Date/Time   NA 148 (H) 04/23/2020 0846   K 4.5 04/23/2020 0846   CL 106 04/23/2020 0846   CO2 24 04/23/2020 0846   GLUCOSE 89 04/23/2020 0846   BUN 18 04/23/2020 0846   CREATININE 1.43 (H) 04/23/2020 0846   CALCIUM 10.4 (H) 04/23/2020 0846   GFRNONAA 48 (L) 04/23/2020 0846   GFRAA 56 (L) 04/23/2020 0846   CrCl cannot be calculated (Patient's most recent lab result is  older than the maximum 21 days allowed.).  COAG No results found for: INR, PROTIME  Radiology VAS [place] ABI WITH/WO TBI  Result Date: 06/19/2020 LOWER EXTREMITY DOPPLER STUDY Indications: Claudication.  Performing Technologist: Blondell Reveal RT, RDMS, RVT  Examination Guidelines: A complete evaluation includes at minimum, Doppler waveform signals and systolic blood pressure reading at the level of bilateral brachial, anterior tibial, and posterior tibial arteries, when vessel segments are accessible. Bilateral testing is considered an integral part of a complete examination. Photoelectric Plethysmograph (PPG) waveforms and toe systolic pressure readings are included as required and additional duplex testing as needed. Limited examinations for reoccurring indications may be performed as noted.  ABI Findings: +---------+------------------+-----+---------+--------+ Right    Rt  Pressure (mmHg)IndexWaveform Comment  +---------+------------------+-----+---------+--------+ Brachial 110                                      +---------+------------------+-----+---------+--------+ ATA      139               1.20 biphasic          +---------+------------------+-----+---------+--------+ PTA      157               1.35 triphasic         +---------+------------------+-----+---------+--------+ Great Toe62                0.53 Normal            +---------+------------------+-----+---------+--------+ +---------+------------------+-----+---------+-------+ Left     Lt Pressure (mmHg)IndexWaveform Comment +---------+------------------+-----+---------+-------+ Brachial 116                                     +---------+------------------+-----+---------+-------+ ATA      147               1.27 biphasic         +---------+------------------+-----+---------+-------+ PTA      150               1.29 triphasic        +---------+------------------+-----+---------+-------+ Great Toe89                 0.77 Normal           +---------+------------------+-----+---------+-------+  Summary: Bilateral: Bilateral ankle-brachial indexes are within normal range. No evidence of significant lower extremity arterial disease. Bilateral toe-brachial indexes are within normal range.  *See table(s) above for measurements and observations.  Electronically signed by Hortencia Pilar MD on 06/19/2020 at 5:41:36 PM.   Final      Assessment/Plan 1. Pain in both lower extremities Recommend:  I do not find evidence of Vascular pathology that would explain the patient's symptoms  The patient has atypical pain symptoms for vascular disease  I do not find evidence of Vascular pathology that would explain the patient's symptoms and I suspect the patient is c/o pseudoclaudication.  Patient should have an evaluation of his LS spine which I defer to the primary service.  Noninvasive studies including venous ultrasound of the legs do not identify vascular problems  The patient should continue walking and begin a more formal exercise program. The patient should continue his antiplatelet therapy and aggressive treatment of the lipid abnormalities. The patient should begin wearing graduated compression socks 15-20 mmHg strength to control her mild edema.  Patient will follow-up with me on a PRN basis  Further work-up of her lower extremity pain is deferred to the primary service     2. Essential hypertension Continue antihypertensive medications as already ordered, these medications have been reviewed and there are no changes at this time.   3. Mixed hyperlipidemia Continue statin as ordered and reviewed, no changes at this time     Hortencia Pilar, MD  06/22/2020 2:07 PM

## 2020-07-03 ENCOUNTER — Other Ambulatory Visit: Payer: Self-pay

## 2020-07-03 ENCOUNTER — Ambulatory Visit
Admission: RE | Admit: 2020-07-03 | Discharge: 2020-07-03 | Disposition: A | Discharge status: Home / Self Care | Payer: Medicare PPO | Source: Ambulatory Visit | Attending: Orthopedic Surgery | Admitting: Orthopedic Surgery

## 2020-07-03 DIAGNOSIS — M4807 Spinal stenosis, lumbosacral region: Secondary | ICD-10-CM

## 2020-07-03 DIAGNOSIS — M5442 Lumbago with sciatica, left side: Secondary | ICD-10-CM | POA: Diagnosis not present

## 2020-07-03 DIAGNOSIS — G8929 Other chronic pain: Secondary | ICD-10-CM

## 2020-07-03 DIAGNOSIS — M5441 Lumbago with sciatica, right side: Secondary | ICD-10-CM | POA: Diagnosis not present

## 2020-07-03 DIAGNOSIS — M47816 Spondylosis without myelopathy or radiculopathy, lumbar region: Secondary | ICD-10-CM | POA: Diagnosis not present

## 2020-07-03 DIAGNOSIS — M51369 Other intervertebral disc degeneration, lumbar region without mention of lumbar back pain or lower extremity pain: Secondary | ICD-10-CM

## 2020-07-03 DIAGNOSIS — M5136 Other intervertebral disc degeneration, lumbar region: Secondary | ICD-10-CM | POA: Diagnosis not present

## 2020-07-03 DIAGNOSIS — M4316 Spondylolisthesis, lumbar region: Secondary | ICD-10-CM | POA: Diagnosis not present

## 2020-07-03 DIAGNOSIS — R531 Weakness: Secondary | ICD-10-CM | POA: Diagnosis not present

## 2020-07-03 DIAGNOSIS — M48061 Spinal stenosis, lumbar region without neurogenic claudication: Secondary | ICD-10-CM | POA: Diagnosis not present

## 2020-07-14 DIAGNOSIS — G8929 Other chronic pain: Secondary | ICD-10-CM | POA: Diagnosis not present

## 2020-07-14 DIAGNOSIS — M5441 Lumbago with sciatica, right side: Secondary | ICD-10-CM | POA: Diagnosis not present

## 2020-07-14 DIAGNOSIS — M5442 Lumbago with sciatica, left side: Secondary | ICD-10-CM | POA: Diagnosis not present

## 2020-07-14 DIAGNOSIS — M48061 Spinal stenosis, lumbar region without neurogenic claudication: Secondary | ICD-10-CM | POA: Diagnosis not present

## 2020-07-14 DIAGNOSIS — G4733 Obstructive sleep apnea (adult) (pediatric): Secondary | ICD-10-CM | POA: Diagnosis not present

## 2020-07-14 DIAGNOSIS — R29898 Other symptoms and signs involving the musculoskeletal system: Secondary | ICD-10-CM | POA: Diagnosis not present

## 2020-07-18 ENCOUNTER — Other Ambulatory Visit: Payer: Self-pay

## 2020-07-18 ENCOUNTER — Ambulatory Visit: Payer: Medicare PPO | Admitting: Family Medicine

## 2020-07-18 ENCOUNTER — Encounter: Payer: Self-pay | Admitting: Family Medicine

## 2020-07-18 VITALS — BP 110/78 | HR 68 | Ht 70.0 in | Wt 195.8 lb

## 2020-07-18 DIAGNOSIS — J020 Streptococcal pharyngitis: Secondary | ICD-10-CM | POA: Diagnosis not present

## 2020-07-18 DIAGNOSIS — R059 Cough, unspecified: Secondary | ICD-10-CM

## 2020-07-18 DIAGNOSIS — J301 Allergic rhinitis due to pollen: Secondary | ICD-10-CM

## 2020-07-18 LAB — POCT RAPID STREP A (OFFICE): Rapid Strep A Screen: POSITIVE — AB

## 2020-07-18 MED ORDER — FLUTICASONE PROPIONATE 50 MCG/ACT NA SUSP: 2.0000 | Freq: Every day | NASAL | 6 refills | Status: DC

## 2020-07-18 MED ORDER — MONTELUKAST SODIUM 10 MG PO TABS: 10.0000 mg | ORAL_TABLET | Freq: Every day | ORAL | 3 refills | Status: DC

## 2020-07-18 MED ORDER — AMOXICILLIN 500 MG PO CAPS: 500.0000 mg | ORAL_CAPSULE | Freq: Three times a day (TID) | ORAL | 0 refills | Status: DC

## 2020-07-18 MED ORDER — BENZONATATE 100 MG PO CAPS: 100.0000 mg | ORAL_CAPSULE | Freq: Two times a day (BID) | ORAL | 0 refills | Status: DC | PRN

## 2020-07-18 NOTE — Progress Notes (Signed)
Date:  07/18/2020   Name:  Jason Krause   DOB:  08-Jan-1946   MRN:  [id number]   Chief Complaint: Cough (Dry, started yesterday) and Sore Throat (Midline; started yesterday; 5/10 pain)  Cough This is a new problem. The current episode started yesterday. The problem has been waxing and waning. The problem occurs every few minutes. The cough is non-productive. Associated symptoms include rhinorrhea and a sore throat. Pertinent negatives include no chest pain, chills, ear congestion, ear pain, fever, headaches, heartburn, hemoptysis, myalgias, nasal congestion, postnasal drip, rash, shortness of breath, sweats, weight loss or wheezing. The symptoms are aggravated by pollens. He has tried nothing (zicam) for the symptoms. The treatment provided no relief. There is no history of environmental allergies.  Sore Throat  This is a new problem. The current episode started yesterday. The problem has been waxing and waning. Neither side of throat is experiencing more pain than the other. There has been no fever. Associated symptoms include coughing. Pertinent negatives include no abdominal pain, congestion, diarrhea, drooling, ear discharge, ear pain, headaches, plugged ear sensation, neck pain, shortness of breath, swollen glands or trouble swallowing.    Lab Results  Component Value Date   CREATININE 1.43 (H) 04/23/2020   BUN 18 04/23/2020   NA 148 (H) 04/23/2020   K 4.5 04/23/2020   CL 106 04/23/2020   CO2 24 04/23/2020   Lab Results  Component Value Date   CHOL 181 04/23/2020   HDL 45 04/23/2020   LDLCALC 123 (H) 04/23/2020   TRIG 69 04/23/2020   CHOLHDL 3.6 11/06/2019   No results found for: TSH No results found for: HGBA1C No results found for: WBC, HGB, HCT, MCV, PLT Lab Results  Component Value Date   ALT 15 11/06/2019   AST 23 11/06/2019   ALKPHOS 98 11/06/2019   BILITOT 1.0 11/06/2019     Review of Systems  Constitutional: Negative for chills, fever and weight loss.  HENT:  Positive for rhinorrhea and sore throat. Negative for congestion, drooling, ear discharge, ear pain, postnasal drip and trouble swallowing.   Respiratory: Positive for cough. Negative for hemoptysis, shortness of breath and wheezing.   Cardiovascular: Negative for chest pain, palpitations and leg swelling.  Gastrointestinal: Negative for abdominal pain, blood in stool, constipation, diarrhea, heartburn and nausea.  Endocrine: Negative for polydipsia.  Genitourinary: Negative for dysuria, frequency, hematuria and urgency.  Musculoskeletal: Negative for back pain, myalgias and neck pain.  Skin: Negative for rash.  Allergic/Immunologic: Negative for environmental allergies.  Neurological: Negative for dizziness and headaches.  Hematological: Does not bruise/bleed easily.  Psychiatric/Behavioral: Negative for suicidal ideas. The patient is not nervous/anxious.     Patient Active Problem List   Diagnosis Date Noted  . Leg pain 06/22/2020  . Personal history of colonic polyps   . Polyp of descending colon   . CKD (chronic kidney disease) stage 3, GFR 30-59 ml/min (HCC) 12/28/2017  . Osteopenia 12/28/2017  . Polyp of gallbladder 12/28/2017  . Right bundle branch block 12/28/2017  . SAH (subarachnoid hemorrhage) ([place]) 11/11/2016  . Essential hypertension 11/09/2016  . Mixed hyperlipidemia 11/08/2016  . 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 Personal history of renal cell carcinoma 11/08/2016  . Cerebrovascular accident ([place]) 10/18/2016  . History of kidney cancer 01/19/2007  . 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[place]) 01/12/2001    Allergies  Allergen Reactions  . Carvedilol Other (See Comments)    "makes me a zombie"    Past Surgical  History:  Procedure Laterality Date  . COLONOSCOPY WITH PROPOFOL N/A 12/06/2019   Procedure: COLONOSCOPY and biopsy WITH PROPOFOL;  Surgeon: Lucilla Lame, MD;  Location: [place];  Service: Endoscopy;  Laterality:  N/A;  priority 4  . HERNIA REPAIR    . kidney removal due to cancer Right   . POLYPECTOMY N/A 12/06/2019   Procedure: POLYPECTOMY;  Surgeon: Lucilla Lame, MD;  Location: [place];  Service: Endoscopy;  Laterality: N/A;  . PROSTATE SURGERY     removed    Social History   Tobacco Use  . Smoking status: Never Smoker  . Smokeless tobacco: Never Used  Vaping Use  . Vaping Use: Never used  Substance Use Topics  . Alcohol use: Yes    Comment: social  . Drug use: Never     Medication list has been reviewed and updated.  Current Meds  Medication Sig  . Coenzyme Q10 100 MG capsule Take 1 capsule by mouth daily.  . folic acid (FOLVITE) 825 MCG tablet Take 1 tablet by mouth daily.  . Glucos-Chondroit-Hyaluron-MSM (GLUCOSAMINE CHONDROITIN JOINT PO) Take 2 tablets by mouth daily.  Marland Kitchen lisinopril (ZESTRIL) 20 MG tablet Take 1 tablet (20 mg total) by mouth daily.  . Multiple Vitamins-Minerals (CENTRUM SILVER PO) Take 1 tablet by mouth daily.  . propranolol ER (INDERAL LA) 60 MG 24 hr capsule Take 3 capsules (180 mg total) by mouth daily. 2 capsules in am and 1 in the pm    PHQ 2/9 Scores 07/18/2020 04/23/2020 03/26/2020 11/06/2019  PHQ - 2 Score 0 0 0 0  PHQ- 9 Score 0 0 - 0    GAD 7 : Generalized Anxiety Score 07/18/2020 11/06/2019 05/08/2019  Nervous, Anxious, on Edge 0 0 0  Control/stop worrying 0 0 0  Worry too much - different things 0 0 0  Trouble relaxing 0 0 0  Restless 0 0 0  Easily annoyed or irritable 0 0 0  Afraid - awful might happen 0 0 0  Total GAD 7 Score 0 0 0    BP Readings from Last 3 Encounters:  07/18/20 110/78  06/19/20 110/79  06/10/20 120/88    Physical Exam Vitals and nursing note reviewed.  HENT:     Head: Normocephalic.     Right Ear: Hearing, tympanic membrane, ear canal and external ear normal.     Left Ear: Hearing, tympanic membrane, ear canal and external ear normal.     Nose: Nose normal. No septal deviation, nasal tenderness, congestion  or rhinorrhea.     Right Turbinates: Swollen. Not enlarged.     Left Turbinates: Swollen. Not enlarged.     Right Sinus: No maxillary sinus tenderness or frontal sinus tenderness.     Left Sinus: No maxillary sinus tenderness or frontal sinus tenderness.  Eyes:     General: Lids are normal. Vision grossly intact. Gaze aligned appropriately. No scleral icterus.       Right eye: No discharge.        Left eye: No discharge.     Extraocular Movements: Extraocular movements intact.     Right eye: Normal extraocular motion.     Left eye: Normal extraocular motion.     Conjunctiva/sclera: Conjunctivae normal.     Pupils: Pupils are equal, round, and reactive to light.  Neck:     Thyroid: No thyromegaly.     Vascular: No JVD.     Trachea: No tracheal deviation.  Cardiovascular:     Rate and Rhythm:  Normal rate and regular rhythm.     Chest Wall: PMI is not displaced.     Heart sounds: Normal heart sounds, S1 normal and S2 normal. No murmur heard.  No systolic murmur is present.  No diastolic murmur is present. No friction rub. No gallop. No S3 or S4 sounds.   Pulmonary:     Effort: No respiratory distress.     Breath sounds: Normal breath sounds. No decreased air movement. No decreased breath sounds, wheezing, rhonchi or rales.  Abdominal:     General: Bowel sounds are normal.     Palpations: Abdomen is soft. There is no hepatomegaly, splenomegaly or mass.     Tenderness: There is no abdominal tenderness. There is no guarding or rebound.  Musculoskeletal:        General: No tenderness. Normal range of motion.     Cervical back: Normal range of motion and neck supple.  Lymphadenopathy:     Cervical: No cervical adenopathy.     Right cervical: No superficial or deep cervical adenopathy.    Left cervical: No superficial or deep cervical adenopathy.  Skin:    General: Skin is warm.     Findings: No rash.  Neurological:     Mental Status: He is alert and oriented to person, place, and  time.     Cranial Nerves: No cranial nerve deficit.     Deep Tendon Reflexes: Reflexes are normal and symmetric.  Psychiatric:        Behavior: Behavior is cooperative.     Wt Readings from Last 3 Encounters:  07/18/20 195 lb 12.8 oz (88.8 kg)  06/19/20 194 lb (88 kg)  06/10/20 184 lb (83.5 kg)    BP 110/78 (BP Location: Right Arm, Patient Position: Sitting, Cuff Size: Normal)   Pulse 68   Ht 5\' 10"  (1.778 m)   Wt 195 lb 12.8 oz (88.8 kg)   BMI 28.09 kg/m   Assessment and Plan:  1. Strep pharyngitis New onset yesterday.  Patient has an erythematous throat without exudate.  On evaluation with rapid strep there is a faint line indicated night early strep pharyngitis possibility and we will treat with amoxicillin 500 mg 3 times a day for 10 days given the vicinity of the sinuses. - POCT rapid strep A - amoxicillin (AMOXIL) 500 MG capsule; Take 1 capsule (500 mg total) by mouth 3 (three) times daily.  Dispense: 30 capsule; Refill: 0  2. Seasonal allergic rhinitis due to pollen Patient has overall allergic rhinitis symptoms as well as turbinates that are swollen.  We will treat with Singulair 10 mg once a day and fluticasone nasal spray. - montelukast (SINGULAIR) 10 MG tablet; Take 1 tablet (10 mg total) by mouth at bedtime.  Dispense: 30 tablet; Refill: 3 - fluticasone (FLONASE) 50 MCG/ACT nasal spray; Place 2 sprays into both nostrils daily.  Dispense: 16 g; Refill: 6  3. Cough Cough is likely secondary to allergies and we will use Tessalon Perles for suppression of cough. - benzonatate (TESSALON) 100 MG capsule; Take 1 capsule (100 mg total) by mouth 2 (two) times daily as needed for cough.  Dispense: 20 capsule; Refill: 0

## 2020-08-11 ENCOUNTER — Ambulatory Visit: Payer: Medicare PPO | Attending: Physical Medicine & Rehabilitation

## 2020-08-11 ENCOUNTER — Other Ambulatory Visit: Payer: Self-pay

## 2020-08-11 DIAGNOSIS — M6281 Muscle weakness (generalized): Secondary | ICD-10-CM | POA: Diagnosis not present

## 2020-08-11 DIAGNOSIS — R262 Difficulty in walking, not elsewhere classified: Secondary | ICD-10-CM | POA: Insufficient documentation

## 2020-08-11 NOTE — Therapy (Signed)
[facility], [facility] [address]. [place], [place], [address] Phone: [phone]   Fax:  [phone]  Physical Therapy Evaluation  Patient Details  Name: Jason Krause MRN: [id number] Date of Birth: March 10, 1946 Referring Provider (PT): Dr. Girtha Hake   Encounter Date: 08/11/2020   PT End of Session - 08/11/20 1325    Visit Number 1    Number of Visits 17    Date for PT Re-Evaluation 10/06/20    Authorization Type eval: 06/13/20    PT Start Time 0800    PT Stop Time 0845    PT Time Calculation (min) 45 min    Activity Tolerance Patient tolerated treatment well    Behavior During Therapy [facility] for tasks assessed/performed           Past Medical History:  Diagnosis Date  . Cancer [facility])    kidney and prostate  . 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 Hyperlipidemia   . Hypertension   . Prostate cancer ([place])   . Stroke ([place])    slight weakness in right foot    Past Surgical History:  Procedure Laterality Date  . COLONOSCOPY WITH PROPOFOL N/A 12/06/2019   Procedure: COLONOSCOPY and biopsy WITH PROPOFOL;  Surgeon: Lucilla Lame, MD;  Location: [place];  Service: Endoscopy;  Laterality: N/A;  priority 4  . HERNIA REPAIR    . kidney removal due to cancer Right   . POLYPECTOMY N/A 12/06/2019   Procedure: POLYPECTOMY;  Surgeon: Lucilla Lame, MD;  Location: [place];  Service: Endoscopy;  Laterality: N/A;  . PROSTATE SURGERY     removed    There were no vitals filed for this visit.    Subjective Assessment - 08/11/20 1055    Subjective "My legs feel weak"    Pertinent History Pt referred by Dr. Alba Destine for bilateral leg weakness, chronic bilateral low back pain with bilateral sciatica, and foraminal stenosis of lumbar region. During interview pt reports his legs feel heavy and weak when he gets tired. States that they won't "do what I tell them to do." He first started noticing  symptoms within the last 2-3 years with gradual worsening. 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 No history of back trauma or surgery. He denies any pain in his low back or legs. He states that in the last couple weeks he has developed L anterior groin pain but does not relate it to his leg weakness. MD discontinued Simvastatin a couple months ago for concerns that it may be affecting his legs but pt has not noticed any improvement in his symptoms since stopping. He reports some "abnormal sensations in his bilateral anterior thighs" but is unable to describe the sensation further. He is able to walk/work in yard for about an hour before the symptoms start. Denies any numbness in legs or feet. If he stands too long he does notice that he starts leaning toward to the right side and his wife has told him multiple times that he is leaning to the right. Lumbar MRI from 07/03/20 showed minimal retrolisthesis of L2 on L3 and L3 on L4. Lumbar spine spondylosis most notable from L2 through L5 with moderate neural foraminal narrowing and mild central canal stenosis. He states that he has had what he thinks is a NCV study but no results found in the chart upon review. He has  an appointment to see Dr. Melrose Nakayama at [facility] neurology this Wednesday. 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 Remote R achilles tendon repair many years ago.    Diagnostic tests See history    Patient Stated Goals "I would like for my legs to feel normal"    Currently in Pain? No/denies              [facility] PT Assessment - 08/11/20 1320      Assessment   Medical Diagnosis bilateral leg weakness, chronic bilateral low back pain with bilateral sciatica, and foraminal stenosis of lumbar region.    Referring Provider (PT) Dr. Girtha Hake    Onset Date/Surgical Date 07/18/17    Hand Dominance Right    Next MD Visit 08/13/20  appoitnemtn with neurology    Prior Therapy None for this issue      Precautions   Precautions None      Restrictions   Weight Bearing Restrictions No      Balance Screen   Has the patient fallen in the past 6 months No    Has the patient had a decrease in activity level because of a fear of falling?  No    Is the patient reluctant to leave their home because of a fear of falling?  No      Home Ecologist residence    Living Arrangements Spouse/significant other    Available Help at Discharge Family    Type of [place] Retired    Biomedical scientist Retired Clinical cytogeneticist    Leisure working in the yard, working on his care      Cognition   Overall Cognitive Status Within Functional Limits for tasks assessed             SUBJECTIVE Chief complaint: "My legs feel weak"  History: Pt referred by Dr. Alba Destine for bilateral leg weakness, chronic bilateral low back pain with bilateral sciatica, and foraminal stenosis of lumbar region. During interview pt reports his legs feel heavy and weak when he gets tired. States that they won't "do what I tell them to do." He first started noticing symptoms within the last 2-3 years with gradual worsening. 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 No history of back trauma or surgery. He denies any pain in his low back or legs. He states that in the last couple weeks he has developed L anterior groin pain but does not relate it to his leg weakness. MD discontinued Simvastatin a couple months ago for concerns that it may be affecting his legs but pt has not noticed any improvement in his symptoms since stopping. He reports some "abnormal sensations in his bilateral anterior thighs" but is unable to describe the sensation further. He is able to walk/work in yard for about an hour before the  symptoms start. Denies any numbness in legs or feet. If he stands too long he does notice that he starts leaning toward to the right side and his wife has told him multiple times that he is leaning to the right. Lumbar MRI from 07/03/20 showed minimal retrolisthesis of L2 on L3 and L3 on L4. Lumbar spine spondylosis most notable from L2 through L5 with moderate neural foraminal narrowing and mild central canal stenosis.  He states that he has had what he thinks is a NCV study but no results found in the chart upon review. He has an appointment to see Dr. Melrose Nakayama at [facility] neurology this Wednesday. 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 Remote R achilles tendon repair many years ago.   Referring Dx: bilateral leg weakness, chronic bilateral low back pain with bilateral sciatica, and foraminal stenosis of lumbar region. Referring Provider: Dr. Alba Destine  Pain quality: pain quality: Denies pain but complains of weakness 24 hour pain behavior: Usually occurs in the afternoon when he is working in the yard Aggravating factors: Extended standing, working in the yard (mowing/putting out Pension scheme manager) Easing factors: sitting (about an hour before he can resume) How long can you sit: no limitation (difficulty getting up)    How long can you stand: Pt has difficulty quantifying time History of back injury, pain, surgery, or therapy: No Follow-up appointment with MD: Yes, neurology appointment 08/13/20 Dominant hand: right Imaging: Yes, see history  Falls in the last 6 months: No  Occupational demands: Retired Scientist, research (life sciences): working in the yard, working on his cars Goals: "I would like for my legs to feel normal" Red flags (bowel/bladder changes, saddle paresthesia, personal history of cancer, chills/fever, night sweats, unrelenting pain, first onset of insidious LBP <20 y/o) Negative    OBJECTIVE  Mental Status Patient is  oriented to person, place and time.  Recent memory is intact.  Remote memory is intact.  Attention span and concentration are intact.  Expressive speech is intact.  Patient's fund of knowledge is within normal limits for educational level.  SENSATION: Deferred   MUSCULOSKELETAL: Tremor: chronic LUE essential tremor x 30 years but not observed during evaluation Bulk: Normal Tone: Normal  Posture Decreased resting lumbar lordosis with upper thoracic kyphosis and forward head posture. R shoulder slightly lower than the L with pt leaning slightly to the R at rest but no signs of scoliosis in standing or forward bending.    Gait Decreased self-selected speed noted but full gait assessment deferred   Palpation No pain to palpation along bilateral lumbar spinals and posterior/lateral hips.    Strength (out of 5) R/L 5/5 Hip flexion 5/5 Hip ER 5/5 Hip IR 4+/4- Hip abduction 4-/4- Hip adduction 4-/4 Hip extension 5/5 Knee extension 4+/4+ Knee flexion 5/5 Ankle dorsiflexion Active bilateral ankle plantarflexion *Indicates pain   AROM (degrees) R/L (all movements include overpressure unless otherwise stated) Lumbar forward flexion (65): Moderate loss of segmental lumbar flexion Lumbar extension (30): Moderate loss of segmental lumbar extension Lumbar lateral flexion (25): Moderate loss bilaterally; Hip IR (0-45): R: 40  L: 20 Hip ER (0-45): >45 bilaterally Hip Flexion (0-125): WNL bilaterally *Indicates pain  Repeated Movements No centralization or peripheralization of symptoms with repeated lumbar extension or flexion.    Muscle Length Hamstrings: R: approximately 75 degrees L: approximately 75 degrees  [place]: L quad tightness    Passive Accessory Intervertebral Motion (PAIVM) Pt denies reproduction of back pain with CPA L1-L5 and UPA bilaterally L1-L5. Generally hypomobile throughout   SPECIAL TESTS Lumbar Radiculopathy and Discogenic: Centralization and  Peripheralization (SN 92, -LR 0.12): Negative Slump (SN 83, -LR 0.32): R: Negative L: Negative SLR (SN 92, -LR 0.29): R: Negative L:  Negative Crossed SLR (SP 90): R: Negative L: Negative  Facet Joint: Extension-Rotation (SN 100, -LR 0.0): R: Negative L: Negative  Lumbar Spinal Stenosis: Lumbar quadrant (SN 70):  R: Negative L: Negative  Hip: FABER (SN 81): R: Negative L: Negative FADIR (SN 94): R: Positive for L groin pain L: Negative Hip scour (SN 50): R: Negative L: Negative  SIJ:  Thigh Thrust (SN 88, -LR 0.18) : R: Not examined L: Not examined  Piriformis Syndrome: FAIR Test (SN 88, SP 83): R: Not examined L: Not examined  Strong posterior tibialis pulses bilaterally, DP pulses not examined as pt is wearing shoes              Objective measurements completed on examination: See above findings.               PT Education - 08/11/20 1324    Education Details Plan of care    Person(s) Educated Patient    Methods Explanation    Comprehension Verbalized understanding            PT Short Term Goals - 08/11/20 1333      PT SHORT TERM GOAL #1   Title Pt will be independent with HEP to improve leg strength in order to improve function at home and out in his yard.    Time 4    Period Weeks    Status New    Target Date 09/08/20             PT Long Term Goals - 08/11/20 1333      PT LONG TERM GOAL #1   Title Pt will be able to work in the yard for at least 2 hours before his legs start to feel tired and fatigued so he can care for his property    Baseline 08/11/20: Legs start to feel week after working in the yard for around 1 hour    Time 8    Period Weeks    Status New    Target Date 10/06/20      PT LONG TERM GOAL #2   Title Pt will increase his FOTO score to at least 71 to demonstrate significant improvement in his function related to his leg weakness    Baseline 08/11/20: 60    Time 8    Period Weeks    Status New    Target Date  10/06/20      PT LONG TERM GOAL #3   Title Pt will report at least 75% improvement in his symptoms in order to be able to work in the yard longer without experiencing leg fatigue.    Time 8    Period Weeks    Status New    Target Date 10/06/20                  Plan - 08/11/20 1325    Clinical Impression Statement Pt is a pleasant 75 year-old male referred for bilateral leg weakness, chronic bilateral low back pain with bilateral sciatica, and foraminal stenosis of lumbar region. He denies any back or LE pain during evaluation and states that his only issue is leg weakness when working out in the yard or standing for an extended period of time. Unable to provoke any back or leg symptoms during evaluation. He demonstrates decreased lumbar range of motion and segmental mobility in all directions but no pain. He presents with weakness in hip abduction, adduction, and extension bilaterally. Additional functional strength, endurance, and balance tests will be performed at next visit. Pt will benefit from skilled PT services to address deficits and return to pain-free function at home and when working out in the yard.  Personal Factors and Comorbidities Age;Comorbidity 3+;Fitness;Past/Current Experience;Time since onset of injury/illness/exacerbation    Comorbidities Stroke, HTN, hyperlipidemia, prostate and kidney cancer, CKD    Examination-Activity Limitations Bend;Carry;Stand;Transfers    Examination-Participation Restrictions Community Activity;Yard Work    Stability/Clinical Decision Making Unstable/Unpredictable    Clinical Decision Making Moderate    Rehab Potential Good    PT Frequency 2x / week    PT Duration 8 weeks    PT Treatment/Interventions ADLs/Self Care Home Management;Aquatic Therapy;Biofeedback;Canalith Repostioning;Cryotherapy;Electrical Stimulation;Iontophoresis 4mg /ml Dexamethasone;Moist Heat;Traction;Ultrasound;DME Instruction;Gait training;Stair training;Functional  mobility training;Therapeutic activities;Therapeutic exercise;Balance training;Neuromuscular re-education;Cognitive remediation;Patient/family education;Manual techniques;Passive range of motion;Dry needling;Vestibular;Spinal Manipulations;Joint Manipulations    PT Next Visit Plan TUG, 5TSTS, 13m gait speed, BERG, Initiate HEP as well as LE strengthening    PT Home Exercise Plan None currently    Consulted and Agree with Plan of Care Patient           Patient will benefit from skilled therapeutic intervention in order to improve the following deficits and impairments:  Decreased strength,Decreased endurance,Difficulty walking  Visit Diagnosis: Muscle weakness (generalized)  Difficulty in walking, not elsewhere classified     Problem List Patient Active Problem List   Diagnosis Date Noted  . Leg pain 06/22/2020  . Personal history of colonic polyps   . Polyp of descending colon   . CKD (chronic kidney disease) stage 3, GFR 30-59 ml/min (HCC) 12/28/2017  . Osteopenia 12/28/2017  . Polyp of gallbladder 12/28/2017  . Right bundle branch block 12/28/2017  . SAH (subarachnoid hemorrhage) ([place]) 11/11/2016  . Essential hypertension 11/09/2016  . Mixed hyperlipidemia 11/08/2016  . 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 Personal history of renal cell carcinoma 11/08/2016  . Cerebrovascular accident ([place]) 10/18/2016  . History of kidney cancer 01/19/2007  . 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[place]) 01/12/2001   Phillips Grout PT, DPT, GCS  Cassius Cullinane 08/11/2020, 4:10 PM  [facility] [address] [place], [place], [address] Phone: [phone]   Fax:  [phone]  Name: Jason Krause MRN: [id number] Date of Birth: 01-18-1946

## 2020-08-14 DIAGNOSIS — G4733 Obstructive sleep apnea (adult) (pediatric): Secondary | ICD-10-CM | POA: Diagnosis not present

## 2020-08-15 ENCOUNTER — Ambulatory Visit: Payer: Medicare PPO

## 2020-08-15 NOTE — Patient Instructions (Addendum)
Access Code: [facility] URL: [email or web address] Date: 08/18/2020 Prepared by: Roxana Hires  Exercises Sit to Stand Without Arm Support - 1 x daily - 7 x weekly - 2 sets - 10 reps Seated Hip Abduction with Resistance - 1 x daily - 7 x weekly - 2 sets - 10 reps - 3s hold Seated Hip Adduction Squeeze with Ball - 1 x daily - 7 x weekly - 2 sets - 10 reps - 3s hold

## 2020-08-18 ENCOUNTER — Ambulatory Visit: Payer: Medicare PPO | Attending: Physical Medicine & Rehabilitation

## 2020-08-18 ENCOUNTER — Other Ambulatory Visit: Payer: Self-pay

## 2020-08-18 DIAGNOSIS — R262 Difficulty in walking, not elsewhere classified: Secondary | ICD-10-CM | POA: Insufficient documentation

## 2020-08-18 DIAGNOSIS — M6281 Muscle weakness (generalized): Secondary | ICD-10-CM | POA: Insufficient documentation

## 2020-08-18 NOTE — Patient Instructions (Incomplete)
TREATMENT   Ther-ex  Light warm-up on NuStep L0 during history x 4 minutes during interval history (2 minutes unbilled);  Seated hip flexion marches with 3# ankle weights (AW) 2 x 15; Seated LAQ with 3# AW 2 x 15; Seated clams with green tband 3s hold 2 x 15; Seated adductor ball squeeze 3s hold 2 x 15;  Standing exercises with 3# AW: Hip abduction 2 x 15; HS curls 2 x 15; Hip extension 2 x 15;   Pt educated throughout session about proper posture and technique with exercises. Improved exercise technique, movement at target joints, use of target muscles after min to mod verbal, visual, tactile cues.    Pt arrives with excellent motivation to participate in therapy. Performed additional outcome measures with patient which are all WNL with the exception of 21/24 on the DGI and 13.3s on the 5TSTS. Initiated strengthening with patient today and he is able to complete without increase in back/LE pain or excessive fatigue. Assigned HEP and pt encouraged to follow-up as scheduled. Pt will benefit from PT services to address deficits in strength, pain, and endurance in order to return to full function at home and when working out in the yard.

## 2020-08-18 NOTE — Therapy (Signed)
[facility] [address] [place], [place], [address] Phone: [phone]   Fax:  [phone]  Physical Therapy Treatment  Patient Details  Name: Jason Krause MRN: [id number] Date of Birth: 16-Oct-1945 Referring Provider (PT): Dr. Girtha Hake   Encounter Date: 08/18/2020   PT End of Session - 08/18/20 0804    Visit Number 2    Number of Visits 17    Date for PT Re-Evaluation 10/06/20    Authorization Type eval: 06/13/20    PT Start Time 0801    PT Stop Time 0845    PT Time Calculation (min) 44 min    Activity Tolerance Patient tolerated treatment well    Behavior During Therapy [facility] for tasks assessed/performed           Past Medical History:  Diagnosis Date  . Cancer [facility])    kidney and prostate  . 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 Hyperlipidemia   . Hypertension   . Prostate cancer ([place])   . Stroke ([place])    slight weakness in right foot    Past Surgical History:  Procedure Laterality Date  . COLONOSCOPY WITH PROPOFOL N/A 12/06/2019   Procedure: COLONOSCOPY and biopsy WITH PROPOFOL;  Surgeon: Lucilla Lame, MD;  Location: [place];  Service: Endoscopy;  Laterality: N/A;  priority 4  . HERNIA REPAIR    . kidney removal due to cancer Right   . POLYPECTOMY N/A 12/06/2019   Procedure: POLYPECTOMY;  Surgeon: Lucilla Lame, MD;  Location: [place];  Service: Endoscopy;  Laterality: N/A;  . PROSTATE SURGERY     removed    There were no vitals filed for this visit.   Subjective Assessment - 08/18/20 0803    Subjective Pt reports that he is doing alright today. He states that last week he "pulled something" in his L groin however he denies any resting pain upon arrival.    Pertinent History Pt referred by Dr. Alba Destine for bilateral leg weakness, chronic bilateral low back pain with bilateral sciatica, and foraminal stenosis of lumbar region. During interview pt  reports his legs feel heavy and weak when he gets tired. States that they won't "do what I tell them to do." He first started noticing symptoms within the last 2-3 years with gradual worsening. 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 No history of back trauma or surgery. He denies any pain in his low back or legs. He states that in the last couple weeks he has developed L anterior groin pain but does not relate it to his leg weakness. MD discontinued Simvastatin a couple months ago for concerns that it may be affecting his legs but pt has not noticed any improvement in his symptoms since stopping. He reports some "abnormal sensations in his bilateral anterior thighs" but is unable to describe the sensation further. He is able to walk/work in yard for about an hour before the symptoms start. Denies any numbness in legs or feet. If he stands too long he does notice that he starts leaning toward to the right side and his wife has told him multiple times that he is leaning to the right. Lumbar MRI from 07/03/20 showed minimal retrolisthesis of L2 on L3 and L3 on L4. Lumbar spine spondylosis most notable from L2 through L5 with moderate neural foraminal narrowing and mild central canal stenosis. He  states that he has had what he thinks is a NCV study but no results found in the chart upon review. He has an appointment to see Dr. Melrose Nakayama at [facility] neurology this Wednesday. 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 Remote R achilles tendon repair many years ago.    Diagnostic tests See history    Patient Stated Goals "I would like for my legs to feel normal"    Currently in Pain? No/denies               [facility] PT Assessment - 08/18/20 0813      Standardized Balance Assessment   Standardized Balance Assessment Berg Balance Test;Dynamic Gait Index      Berg Balance Test   Sit to Stand Able to stand  without using hands and stabilize independently    Standing Unsupported Able to stand safely 2 minutes    Sitting with Back Unsupported but Feet Supported on Floor or Stool Able to sit safely and securely 2 minutes    Stand to Sit Sits safely with minimal use of hands    Transfers Able to transfer safely, minor use of hands    Standing Unsupported with Eyes Closed Able to stand 10 seconds safely    Standing Unsupported with Feet Together Able to place feet together independently and stand 1 minute safely    From Standing, Reach Forward with Outstretched Arm Can reach confidently >25 cm (10")    From Standing Position, Pick up Object from Floor Able to pick up shoe safely and easily    From Standing Position, Turn to Look Behind Over each Shoulder Looks behind from both sides and weight shifts well    Turn 360 Degrees Able to turn 360 degrees safely in 4 seconds or less    Standing Unsupported, Alternately Place Feet on Step/Stool Able to stand independently and safely and complete 8 steps in 20 seconds    Standing Unsupported, One Foot in Front Able to place foot tandem independently and hold 30 seconds    Standing on One Leg Able to lift leg independently and hold > 10 seconds    Total Score 56      Dynamic Gait Index   Level Surface Normal    Change in Gait Speed Normal    Gait with Horizontal Head Turns Mild Impairment    Gait with Vertical Head Turns Mild Impairment    Gait and Pivot Turn Normal    Step Over Obstacle Normal    Step Around Obstacles Normal    Steps Mild Impairment    Total Score 21            TREATMENT   Ther-ex  Light warm-up on NuStep L0 during history x 4 minutes during interval history (2 minutes unbilled);  Completed additional outcome measures: TUG: 10.7s 5TSTS: 13.3s 54m gait speed: self-selected: 9.5s = 1.05 m/s BERG: 56/56 DGI: 21/24  Seated hip flexion marches with 3# ankle weights (AW) 2 x 15; Seated LAQ with 3# AW 2 x 15; Seated clams  with green tband 3s hold 2 x 15; Seated adductor ball squeeze 3s hold 2 x 15;  Standing exercises with 3# AW: Hip abduction 2 x 15; HS curls 2 x 15; Hip extension 2 x 15;   Pt educated throughout session about proper posture and technique with exercises. Improved exercise technique, movement at target joints, use of target muscles after min to mod verbal,  visual, tactile cues.    Pt arrives with excellent motivation to participate in therapy. Performed additional outcome measures with patient which are all WNL with the exception of 21/24 on the DGI and 13.3s on the 5TSTS. Initiated strengthening with patient today and he is able to complete without increase in back/LE pain or excessive fatigue. Assigned HEP and pt encouraged to follow-up as scheduled. Pt will benefit from PT services to address deficits in strength, pain, and endurance in order to return to full function at home and when working out in the yard.                   PT Education - 08/18/20 0847    Education Details HEP    Person(s) Educated Patient    Methods Explanation    Comprehension Verbalized understanding            PT Short Term Goals - 08/11/20 1333      PT SHORT TERM GOAL #1   Title Pt will be independent with HEP to improve leg strength in order to improve function at home and out in his yard.    Time 4    Period Weeks    Status New    Target Date 09/08/20             PT Long Term Goals - 08/11/20 1333      PT LONG TERM GOAL #1   Title Pt will be able to work in the yard for at least 2 hours before his legs start to feel tired and fatigued so he can care for his property    Baseline 08/11/20: Legs start to feel week after working in the yard for around 1 hour    Time 8    Period Weeks    Status New    Target Date 10/06/20      PT LONG TERM GOAL #2   Title Pt will increase his FOTO score to at least 71 to demonstrate significant improvement in his function related to his leg  weakness    Baseline 08/11/20: 60    Time 8    Period Weeks    Status New    Target Date 10/06/20      PT LONG TERM GOAL #3   Title Pt will report at least 75% improvement in his symptoms in order to be able to work in the yard longer without experiencing leg fatigue.    Time 8    Period Weeks    Status New    Target Date 10/06/20                 Plan - 08/18/20 0804    Clinical Impression Statement Pt arrives with excellent motivation to participate in therapy. Performed additional outcome measures with patient which are all WNL with the exception of 21/24 on the DGI and 13.3s on the 5TSTS. Initiated strengthening with patient today and he is able to complete without increase in back/LE pain or excessive fatigue. Assigned HEP and pt encouraged to follow-up as scheduled. Pt will benefit from PT services to address deficits in strength, pain, and endurance in order to return to full function at home and when working out in the yard.    Personal Factors and Comorbidities Age;Comorbidity 3+;Fitness;Past/Current Experience;Time since onset of injury/illness/exacerbation    Comorbidities Stroke, HTN, hyperlipidemia, prostate and kidney cancer, CKD    Examination-Activity Limitations Bend;Carry;Stand;Transfers    Examination-Participation Restrictions Community Activity;Yard Work    Stability/Clinical  Decision Making Unstable/Unpredictable    Rehab Potential Good    PT Frequency 2x / week    PT Duration 8 weeks    PT Treatment/Interventions ADLs/Self Care Home Management;Aquatic Therapy;Biofeedback;Canalith Repostioning;Cryotherapy;Electrical Stimulation;Iontophoresis 4mg /ml Dexamethasone;Moist Heat;Traction;Ultrasound;DME Instruction;Gait training;Stair training;Functional mobility training;Therapeutic activities;Therapeutic exercise;Balance training;Neuromuscular re-education;Cognitive remediation;Patient/family education;Manual techniques;Passive range of motion;Dry  needling;Vestibular;Spinal Manipulations;Joint Manipulations    PT Next Visit Plan Progress balance and strengthening    PT Home Exercise Plan Access Code: [email or web address]    Consulted and Agree with Plan of Care Patient           Patient will benefit from skilled therapeutic intervention in order to improve the following deficits and impairments:  Decreased strength,Decreased endurance,Difficulty walking  Visit Diagnosis: Muscle weakness (generalized)  Difficulty in walking, not elsewhere classified     Problem List Patient Active Problem List   Diagnosis Date Noted  . Leg pain 06/22/2020  . Personal history of colonic polyps   . Polyp of descending colon   . CKD (chronic kidney disease) stage 3, GFR 30-59 ml/min (HCC) 12/28/2017  . Osteopenia 12/28/2017  . Polyp of gallbladder 12/28/2017  . Right bundle branch block 12/28/2017  . SAH (subarachnoid hemorrhage) ([place]) 11/11/2016  . Essential hypertension 11/09/2016  . Mixed hyperlipidemia 11/08/2016  . 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 Personal history of renal cell carcinoma 11/08/2016  . Cerebrovascular accident ([place]) 10/18/2016  . History of kidney cancer 01/19/2007  . 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[employer]) 01/12/2001   Phillips Grout PT, DPT, GCS  Shauntavia Brackin 08/18/2020, 8:49 AM  [facility] [address] [place], [place], [address] Phone: [phone]   Fax:  [phone]  Name: Jason Krause MRN: [id number] Date of Birth: 1945-09-10

## 2020-08-19 DIAGNOSIS — R29898 Other symptoms and signs involving the musculoskeletal system: Secondary | ICD-10-CM | POA: Diagnosis not present

## 2020-08-20 ENCOUNTER — Other Ambulatory Visit: Payer: Self-pay

## 2020-08-20 ENCOUNTER — Ambulatory Visit: Payer: Medicare PPO

## 2020-08-20 DIAGNOSIS — M6281 Muscle weakness (generalized): Secondary | ICD-10-CM

## 2020-08-20 DIAGNOSIS — R262 Difficulty in walking, not elsewhere classified: Secondary | ICD-10-CM | POA: Diagnosis not present

## 2020-08-20 NOTE — Therapy (Signed)
[facility] [address] [place], [place], [address] Phone: [phone]   Fax:  [phone]  Physical Therapy Treatment  Patient Details  Name: Jason Krause MRN: [id number] Date of Birth: 02-25-1946 Referring Provider (PT): Dr. Girtha Hake   Encounter Date: 08/20/2020   PT End of Session - 08/20/20 0805    Visit Number 3    Number of Visits 17    Date for PT Re-Evaluation 10/06/20    Authorization Type eval: 06/13/20    PT Start Time 0801    PT Stop Time 0845    PT Time Calculation (min) 44 min    Activity Tolerance Patient tolerated treatment well    Behavior During Therapy [facility] for tasks assessed/performed           Past Medical History:  Diagnosis Date  . Cancer [facility])    kidney and prostate  . 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 Hyperlipidemia   . Hypertension   . Prostate cancer ([place])   . Stroke ([place])    slight weakness in right foot    Past Surgical History:  Procedure Laterality Date  . COLONOSCOPY WITH PROPOFOL N/A 12/06/2019   Procedure: COLONOSCOPY and biopsy WITH PROPOFOL;  Surgeon: Lucilla Lame, MD;  Location: [place];  Service: Endoscopy;  Laterality: N/A;  priority 4  . HERNIA REPAIR    . kidney removal due to cancer Right   . POLYPECTOMY N/A 12/06/2019   Procedure: POLYPECTOMY;  Surgeon: Lucilla Lame, MD;  Location: [place];  Service: Endoscopy;  Laterality: N/A;  . PROSTATE SURGERY     removed    There were no vitals filed for this visit.   Subjective Assessment - 08/20/20 0804    Subjective Pt reports that he is doing alright today. He continues to have some L groin pain with walking but denies any resting pain in sitting upon arrival. No excessive soreness after last therapy session. No specific questions or concerns currently.    Pertinent History Pt referred by Dr. Alba Destine for bilateral leg weakness, chronic bilateral low back pain  with bilateral sciatica, and foraminal stenosis of lumbar region. During interview pt reports his legs feel heavy and weak when he gets tired. States that they won't "do what I tell them to do." He first started noticing symptoms within the last 2-3 years with gradual worsening. 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 No history of back trauma or surgery. He denies any pain in his low back or legs. He states that in the last couple weeks he has developed L anterior groin pain but does not relate it to his leg weakness. MD discontinued Simvastatin a couple months ago for concerns that it may be affecting his legs but pt has not noticed any improvement in his symptoms since stopping. He reports some "abnormal sensations in his bilateral anterior thighs" but is unable to describe the sensation further. He is able to walk/work in yard for about an hour before the symptoms start. Denies any numbness in legs or feet. If he stands too long he does notice that he starts leaning toward to the right side and his wife has told him multiple times that he is leaning to the right. Lumbar MRI from 07/03/20 showed minimal retrolisthesis of L2 on L3 and L3 on L4. Lumbar spine spondylosis most notable from L2 through  L5 with moderate neural foraminal narrowing and mild central canal stenosis. He states that he has had what he thinks is a NCV study but no results found in the chart upon review. He has an appointment to see Dr. Melrose Nakayama at [facility] neurology this Wednesday. 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 Remote R achilles tendon repair many years ago.    Diagnostic tests See history    Patient Stated Goals "I would like for my legs to feel normal"    Currently in Pain? No/denies               TREATMENT   Ther-ex  Warm-up on NuStep L1-5 intervals with therapist adjusting resistance during history x  5:30 minutes during history;  TRX squats with glut taps on seat, verbal and tactile cues for proper form, 2 x 10; TRX reverse lunges with demonstration, verbal cues, and tactile cues x 10 BLE;  Standing exercises with 4# AW: Hip flexion marching x 1 minute Hip abduction x 1 minute; HS curls x 1 minute; Hip extension x 1 minute;  Seated alternating LAQ with 4# AW x 1 minute ;  6" alternating step-ups without UE support x 10 each; 6" step with 2" Airex pad on top alternating step-ups without UE support x 10 each; Resisted gait in // bars with double black theratube forward, R lateral, L lateral, and backwards with light finger touches x 5 each direction;   Pt educated throughout session about proper posture and technique with exercises. Improved exercise technique, movement at target joints, use of target muscles after min to mod verbal, visual, tactile cues.    Pt arrives with excellent motivation to participate in therapy. Continued strengthening with patient today and progressed challenge. He is able to complete without increase in back/LE pain or excessive fatigue. He does demonstrate difficulty with step-ups onto Airex pad. No HEP progression during session today. Pt encouraged to follow-up as scheduled. Pt will benefit from PT services to address deficits in strength, pain, and endurance in order to return to full function at home and when working out in the yard.                           PT Short Term Goals - 08/11/20 1333      PT SHORT TERM GOAL #1   Title Pt will be independent with HEP to improve leg strength in order to improve function at home and out in his yard.    Time 4    Period Weeks    Status New    Target Date 09/08/20             PT Long Term Goals - 08/11/20 1333      PT LONG TERM GOAL #1   Title Pt will be able to work in the yard for at least 2 hours before his legs start to feel tired and fatigued so he can care for his  property    Baseline 08/11/20: Legs start to feel week after working in the yard for around 1 hour    Time 8    Period Weeks    Status New    Target Date 10/06/20      PT LONG TERM GOAL #2   Title Pt will increase his FOTO score to at least 71 to demonstrate significant improvement in his function related to his leg weakness  Baseline 08/11/20: 60    Time 8    Period Weeks    Status New    Target Date 10/06/20      PT LONG TERM GOAL #3   Title Pt will report at least 75% improvement in his symptoms in order to be able to work in the yard longer without experiencing leg fatigue.    Time 8    Period Weeks    Status New    Target Date 10/06/20                 Plan - 08/20/20 0805    Clinical Impression Statement Pt arrives with excellent motivation to participate in therapy. Continued strengthening with patient today and progressed challenge. He is able to complete without increase in back/LE pain or excessive fatigue. He does demonstrate difficulty with step-ups onto Airex pad. No HEP progression during session today. Pt encouraged to follow-up as scheduled. Pt will benefit from PT services to address deficits in strength, pain, and endurance in order to return to full function at home and when working out in the yard.    Personal Factors and Comorbidities Age;Comorbidity 3+;Fitness;Past/Current Experience;Time since onset of injury/illness/exacerbation    Comorbidities Stroke, HTN, hyperlipidemia, prostate and kidney cancer, CKD    Examination-Activity Limitations Bend;Carry;Stand;Transfers    Examination-Participation Restrictions Community Activity;Yard Work    Stability/Clinical Decision Making Unstable/Unpredictable    Rehab Potential Good    PT Frequency 2x / week    PT Duration 8 weeks    PT Treatment/Interventions ADLs/Self Care Home Management;Aquatic Therapy;Biofeedback;Canalith Repostioning;Cryotherapy;Electrical Stimulation;Iontophoresis 4mg /ml Dexamethasone;Moist  Heat;Traction;Ultrasound;DME Instruction;Gait training;Stair training;Functional mobility training;Therapeutic activities;Therapeutic exercise;Balance training;Neuromuscular re-education;Cognitive remediation;Patient/family education;Manual techniques;Passive range of motion;Dry needling;Vestibular;Spinal Manipulations;Joint Manipulations    PT Next Visit Plan Progress balance and strengthening    PT Home Exercise Plan Access Code: [email or web address]    Consulted and Agree with Plan of Care Patient           Patient will benefit from skilled therapeutic intervention in order to improve the following deficits and impairments:  Decreased strength,Decreased endurance,Difficulty walking  Visit Diagnosis: Muscle weakness (generalized)  Difficulty in walking, not elsewhere classified     Problem List Patient Active Problem List   Diagnosis Date Noted  . Leg pain 06/22/2020  . Personal history of colonic polyps   . Polyp of descending colon   . CKD (chronic kidney disease) stage 3, GFR 30-59 ml/min (HCC) 12/28/2017  . Osteopenia 12/28/2017  . Polyp of gallbladder 12/28/2017  . Right bundle branch block 12/28/2017  . SAH (subarachnoid hemorrhage) ([place]) 11/11/2016  . Essential hypertension 11/09/2016  . Mixed hyperlipidemia 11/08/2016  . 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 Personal history of renal cell carcinoma 11/08/2016  . Cerebrovascular accident ([place]) 10/18/2016  . History of kidney cancer 01/19/2007  . 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[place]) 01/12/2001   Jason Krause PT, DPT, GCS  Jason Krause 08/20/2020, 9:46 AM  [facility] [address] [place], [place], [address] Phone: [phone]   Fax:  [phone]  Name: Jason Krause MRN: [id number] Date of Birth: 09-15-1945

## 2020-08-25 ENCOUNTER — Other Ambulatory Visit: Payer: Self-pay

## 2020-08-25 ENCOUNTER — Ambulatory Visit: Payer: Medicare PPO

## 2020-08-25 DIAGNOSIS — R262 Difficulty in walking, not elsewhere classified: Secondary | ICD-10-CM | POA: Diagnosis not present

## 2020-08-25 DIAGNOSIS — M6281 Muscle weakness (generalized): Secondary | ICD-10-CM | POA: Diagnosis not present

## 2020-08-25 NOTE — Therapy (Signed)
[facility] [address]. [place], [place], [address] Phone: [phone]   Fax:  [phone]  Physical Therapy Treatment  Patient Details  Name: Jason Krause MRN: [id number] Date of Birth: [birth year] Referring Provider (PT): Dr. Girtha Hake   Encounter Date: 08/25/2020   PT End of Session - 08/25/20 0802    Visit Number 4    Number of Visits 17    Date for PT Re-Evaluation 10/06/20    Authorization Type eval: 06/13/20    PT Start Time 0758    PT Stop Time 0843    PT Time Calculation (min) 45 min    Activity Tolerance Patient tolerated treatment well    Behavior During Therapy [facility] for tasks assessed/performed           Past Medical History:  Diagnosis Date  . Cancer [facility])    kidney and prostate  . 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 Hyperlipidemia   . Hypertension   . Prostate cancer ([place])   . Stroke ([place])    slight weakness in right foot    Past Surgical History:  Procedure Laterality Date  . COLONOSCOPY WITH PROPOFOL N/A 12/06/2019   Procedure: COLONOSCOPY and biopsy WITH PROPOFOL;  Surgeon: Lucilla Lame, MD;  Location: [facility];  Service: Endoscopy;  Laterality: N/A;  priority 4  . HERNIA REPAIR    . kidney removal due to cancer Right   . POLYPECTOMY N/A 12/06/2019   Procedure: POLYPECTOMY;  Surgeon: Lucilla Lame, MD;  Location: [facility];  Service: Endoscopy;  Laterality: N/A;  . PROSTATE SURGERY     removed    There were no vitals filed for this visit.   Subjective Assessment - 08/25/20 0801    Subjective Pt reports that he is doing alright today. He continues to have some L groin pain which he rates as a 4/10 upon arrival. Most noticeable when he is flexing his L hip such as when he is getting out of the car. No excessive soreness after last therapy session. No specific questions or concerns currently.    Pertinent History Pt referred by Dr. Alba Destine  for bilateral leg weakness, chronic bilateral low back pain with bilateral sciatica, and foraminal stenosis of lumbar region. During interview pt reports his legs feel heavy and weak when he gets tired. States that they won't "do what I tell them to do." He first started noticing symptoms within the last 2-3 years with gradual worsening. 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 No history of back trauma or surgery. He denies any pain in his low back or legs. He states that in the last couple weeks he has developed L anterior groin pain but does not relate it to his leg weakness. MD discontinued Simvastatin a couple months ago for concerns that it may be affecting his legs but pt has not noticed any improvement in his symptoms since stopping. He reports some "abnormal sensations in his bilateral anterior thighs" but is unable to describe the sensation further. He is able to walk/work in yard for about an hour before the symptoms start. Denies any numbness in legs or feet. If he stands too long he does notice that he starts leaning toward to the right side and his wife has told him multiple times that he is leaning to the right. Lumbar MRI from 07/03/20 showed minimal retrolisthesis  of L2 on L3 and L3 on L4. Lumbar spine spondylosis most notable from L2 through L5 with moderate neural foraminal narrowing and mild central canal stenosis. He states that he has had what he thinks is a NCV study but no results found in the chart upon review. He has an appointment to see Dr. Melrose Nakayama at [facility] neurology this Wednesday. 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 Remote R achilles tendon repair many years ago.    Diagnostic tests See history    Patient Stated Goals "I would like for my legs to feel normal"    Currently in Pain? Yes    Pain Score 4     Pain Location Hip    Pain Orientation Left    Pain  Descriptors / Indicators Aching    Pain Type Chronic pain    Pain Onset 1 to 4 weeks ago    Pain Frequency Intermittent              TREATMENT   Ther-ex Warm-up on NuStep L1-3 intervals with therapist adjusting resistance during history x 6 minutes during history;  Hooklying posterior pelvic tilt 5s hold x 10, verbal and tactile cues; Hooklying lumbar rotation knee rocks x 1 minute; Single knee to chest stretch 45s hold bilateral; Hip FABER and FADIR stretches x 45s each bilateral; Hooklying bridges x 10; Hooklying clams with manual resistance from therapist Hooklying alternating heel taps starting in 90/90 position with cues from therapist to keep low back on mat table 2 x 10; Sidelying straight leg hip abduction 2 x 10 BLE; Sit to stand with 8# dumbbell 2 x 10; Forward 6" step-ups without UE support alternating leading LE x 10; Lateral 6" step-ups without UE support x 10 toward each side; Step heel raises with BUE support x 20; HEP modification;   Pt educated throughout session about proper posture and technique with exercises. Improved exercise technique, movement at target joints, use of target muscles after min to mod verbal, visual, tactile cues.   Pt arrives with excellent motivation to participate in therapy. Continued strengthening with patient today and introduced some mat table strengthening and stretching. He is able to complete without increase in back/LE pain or excessive fatigue. Progresse HEP during session today to include some hip/low back stretches. Pt encouraged to follow-up as scheduled.Pt will benefit from PT services to address deficits in strength, pain, and endurancein order to return to full function at homeand when working out in the yard.                         PT Education - 08/25/20 1020    Education Details HEP modification    Person(s) Educated Patient    Methods Explanation    Comprehension Verbalized  understanding            PT Short Term Goals - 08/11/20 1333      PT SHORT TERM GOAL #1   Title Pt will be independent with HEP to improve leg strength in order to improve function at home and out in his yard.    Time 4    Period Weeks    Status New    Target Date 09/08/20             PT Long Term Goals - 08/11/20 1333      PT LONG TERM GOAL #1   Title Pt will be able  to work in the yard for at least 2 hours before his legs start to feel tired and fatigued so he can care for his property    Baseline 08/11/20: Legs start to feel week after working in the yard for around 1 hour    Time 8    Period Weeks    Status New    Target Date 10/06/20      PT LONG TERM GOAL #2   Title Pt will increase his FOTO score to at least 71 to demonstrate significant improvement in his function related to his leg weakness    Baseline 08/11/20: 60    Time 8    Period Weeks    Status New    Target Date 10/06/20      PT LONG TERM GOAL #3   Title Pt will report at least 75% improvement in his symptoms in order to be able to work in the yard longer without experiencing leg fatigue.    Time 8    Period Weeks    Status New    Target Date 10/06/20                 Plan - 08/25/20 0802    Clinical Impression Statement Pt arrives with excellent motivation to participate in therapy. Continued strengthening with patient today and introduced some mat table strengthening and stretching. He is able to complete without increase in back/LE pain or excessive fatigue. Progresse HEP during session today to include some hip/low back stretches. Pt encouraged to follow-up as scheduled. Pt will benefit from PT services to address deficits in strength, pain, and endurance in order to return to full function at home and when working out in the yard.    Personal Factors and Comorbidities Age;Comorbidity 3+;Fitness;Past/Current Experience;Time since onset of injury/illness/exacerbation    Comorbidities Stroke,  HTN, hyperlipidemia, prostate and kidney cancer, CKD    Examination-Activity Limitations Bend;Carry;Stand;Transfers    Examination-Participation Restrictions Community Activity;Yard Work    Stability/Clinical Decision Making Unstable/Unpredictable    Rehab Potential Good    PT Frequency 2x / week    PT Duration 8 weeks    PT Treatment/Interventions ADLs/Self Care Home Management;Aquatic Therapy;Biofeedback;Canalith Repostioning;Cryotherapy;Electrical Stimulation;Iontophoresis 4mg /ml Dexamethasone;Moist Heat;Traction;Ultrasound;DME Instruction;Gait training;Stair training;Functional mobility training;Therapeutic activities;Therapeutic exercise;Balance training;Neuromuscular re-education;Cognitive remediation;Patient/family education;Manual techniques;Passive range of motion;Dry needling;Vestibular;Spinal Manipulations;Joint Manipulations    PT Next Visit Plan Progress strengthening    PT Home Exercise Plan Access Code: [email or web address]    Consulted and Agree with Plan of Care Patient           Patient will benefit from skilled therapeutic intervention in order to improve the following deficits and impairments:  Decreased strength,Decreased endurance,Difficulty walking  Visit Diagnosis: Muscle weakness (generalized)  Difficulty in walking, not elsewhere classified     Problem List Patient Active Problem List   Diagnosis Date Noted  . Leg pain 06/22/2020  . Personal history of colonic polyps   . Polyp of descending colon   . CKD (chronic kidney disease) stage 3, GFR 30-59 ml/min (HCC) 12/28/2017  . Osteopenia 12/28/2017  . Polyp of gallbladder 12/28/2017  . Right bundle branch block 12/28/2017  . SAH (subarachnoid hemorrhage) ([place]) 11/11/2016  . Essential hypertension 11/09/2016  . Mixed hyperlipidemia 11/08/2016  . 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 Personal history of renal cell carcinoma 11/08/2016  . Cerebrovascular accident ([place])  10/18/2016  . History of kidney cancer 01/19/2007  . 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[place]) 01/12/2001   Lyndel Safe Austen Wygant PT,  DPT, GCS  Emmaus Brandi 08/25/2020, 10:24 AM  [facility] [address]. [place], [place], [address] Phone: [phone]   Fax:  [phone]  Name: Jason Krause MRN: [id number] Date of Birth: [birth year]

## 2020-08-26 DIAGNOSIS — G4733 Obstructive sleep apnea (adult) (pediatric): Secondary | ICD-10-CM | POA: Diagnosis not present

## 2020-08-26 NOTE — Patient Instructions (Incomplete)
TREATMENT   Ther-ex Warm-up on NuStep L1-3 intervals with therapist adjusting resistanceduring history x 6 minutes during history;  Hooklying posterior pelvic tilt 5s hold x 10, verbal and tactile cues; Hooklying lumbar rotation knee rocks x 1 minute; Single knee to chest stretch 45s hold bilateral; Hip FABER and FADIR stretches x 45s each bilateral; Hooklying bridges x 10; Hooklying clams with manual resistance from therapist Hooklying alternating heel taps starting in 90/90 position with cues from therapist to keep low back on mat table 2 x 10; Sidelying straight leg hip abduction 2 x 10 BLE; Sit to stand with 8# dumbbell 2 x 10; Forward 6" step-ups without UE support alternating leading LE x 10; Lateral 6" step-ups without UE support x 10 toward each side; Step heel raises with BUE support x 20; HEP modification;   Pt educated throughout session about proper posture and technique with exercises. Improved exercise technique, movement at target joints, use of target muscles after min to mod verbal, visual, tactile cues.   Pt arrives with excellent motivation to participate in therapy.Continuedstrengthening with patient todayand introduced some mat table strengthening and stretching. He isable to complete without increase in back/LE pain or excessive fatigue.Progresse HEP during session today to include some hip/low back stretches. Pt encouraged to follow-up as scheduled.Pt will benefit from PT services to address deficits in strength, pain, and endurancein order to return to full function at homeand when working out in the yard.

## 2020-08-27 ENCOUNTER — Ambulatory Visit: Payer: Medicare PPO

## 2020-08-27 ENCOUNTER — Other Ambulatory Visit: Payer: Self-pay

## 2020-08-27 DIAGNOSIS — M6281 Muscle weakness (generalized): Secondary | ICD-10-CM

## 2020-08-27 DIAGNOSIS — R262 Difficulty in walking, not elsewhere classified: Secondary | ICD-10-CM

## 2020-08-27 NOTE — Therapy (Signed)
[facility] [address] [place], [place], [address] Phone: [phone]   Fax:  [phone]  Physical Therapy Treatment  Patient Details  Name: Jason Krause MRN: [id number] Date of Birth: 1945/10/13 Referring Provider (PT): Dr. Girtha Hake   Encounter Date: 08/27/2020   PT End of Session - 08/27/20 0804    Visit Number 5    Number of Visits 17    Date for PT Re-Evaluation 10/06/20    Authorization Type eval: 06/13/20    PT Start Time 0800    PT Stop Time 0845    PT Time Calculation (min) 45 min    Activity Tolerance Patient tolerated treatment well    Behavior During Therapy [facility] for tasks assessed/performed           Past Medical History:  Diagnosis Date  . Cancer [facility])    kidney and prostate  . 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 Hyperlipidemia   . Hypertension   . Prostate cancer ([place])   . Stroke ([place])    slight weakness in right foot    Past Surgical History:  Procedure Laterality Date  . COLONOSCOPY WITH PROPOFOL N/A 12/06/2019   Procedure: COLONOSCOPY and biopsy WITH PROPOFOL;  Surgeon: Lucilla Lame, MD;  Location: [facility];  Service: Endoscopy;  Laterality: N/A;  priority 4  . HERNIA REPAIR    . kidney removal due to cancer Right   . POLYPECTOMY N/A 12/06/2019   Procedure: POLYPECTOMY;  Surgeon: Lucilla Lame, MD;  Location: [facility];  Service: Endoscopy;  Laterality: N/A;  . PROSTATE SURGERY     removed    There were no vitals filed for this visit.   Subjective Assessment - 08/27/20 0802    Subjective Pt reports that he is doing well today. He continues to have some L groin pain which he rates as a 3-4/10 upon arrival. No excessive soreness after last therapy session. No specific questions or concerns currently.    Pertinent History Pt referred by Dr. Alba Destine for bilateral leg weakness, chronic bilateral low back pain with bilateral sciatica, and  foraminal stenosis of lumbar region. During interview pt reports his legs feel heavy and weak when he gets tired. States that they won't "do what I tell them to do." He first started noticing symptoms within the last 2-3 years with gradual worsening. 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 No history of back trauma or surgery. He denies any pain in his low back or legs. He states that in the last couple weeks he has developed L anterior groin pain but does not relate it to his leg weakness. MD discontinued Simvastatin a couple months ago for concerns that it may be affecting his legs but pt has not noticed any improvement in his symptoms since stopping. He reports some "abnormal sensations in his bilateral anterior thighs" but is unable to describe the sensation further. He is able to walk/work in yard for about an hour before the symptoms start. Denies any numbness in legs or feet. If he stands too long he does notice that he starts leaning toward to the right side and his wife has told him multiple times that he is leaning to the right. Lumbar MRI from 07/03/20 showed minimal retrolisthesis of L2 on L3 and L3 on L4. Lumbar spine spondylosis most notable from L2 through L5 with moderate  neural foraminal narrowing and mild central canal stenosis. He states that he has had what he thinks is a NCV study but no results found in the chart upon review. He has an appointment to see Dr. Melrose Nakayama at [facility] neurology this Wednesday. 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 Remote R achilles tendon repair many years ago.    Diagnostic tests See history    Patient Stated Goals "I would like for my legs to feel normal"    Currently in Pain? Yes    Pain Score 4     Pain Location Groin    Pain Orientation Left    Pain Descriptors / Indicators Aching    Pain Onset 1 to 4 weeks ago    Pain Frequency Intermittent                TREATMENT   Ther-ex Warm-up on NuStep alternating between L2 and L6 in 45s intervals with therapist adjusting resistance to match intensity x 6 minutes, brief history obtained; Hooklying posterior pelvic tilt 5s hold x 10, verbal and tactile cues; Hooklying lumbar rotation knee rocks x 1 minute; Hooklying lumbar rotation stretch x 45s bilateral; Single knee to chest stretch 45s hold bilateral; Hip FABER and FADIR stretches x 45s each bilateral; Hooklying bridges x 15; Hooklying clams with manual resistance from therapist x 15; Hooklying adductor squeeze with manual resistance from therapist x 15; Sit to stand with 10# medicine ball from low mat table 2 x 10; Nautilus lat pull down 60# x 15, 70# x 15; Nautilus resisted forward, backward, R lateral, and L lateral 50# x 3 each direction;   Pt educated throughout session about proper posture and technique with exercises. Improved exercise technique, movement at target joints, use of target muscles after min to mod verbal, visual, tactile cues.   Pt arrives with excellent motivation to participate in therapy.Continuedstrengthening with patient todayand introduced resisted gait. He does struggle some with resisted lateral stepping. He isable to complete all exercises again today without increase in back/LE pain or excessive fatigue.Pt encouraged to follow-up as scheduled.Pt will benefit from PT services to address deficits in strength, pain, and endurancein order to return to full function at homeand when working out in the yard.                          PT Short Term Goals - 08/11/20 1333      PT SHORT TERM GOAL #1   Title Pt will be independent with HEP to improve leg strength in order to improve function at home and out in his yard.    Time 4    Period Weeks    Status New    Target Date 09/08/20             PT Long Term Goals - 08/11/20 1333      PT LONG TERM GOAL #1    Title Pt will be able to work in the yard for at least 2 hours before his legs start to feel tired and fatigued so he can care for his property    Baseline 08/11/20: Legs start to feel week after working in the yard for around 1 hour    Time 8    Period Weeks    Status New    Target Date 10/06/20      PT LONG TERM GOAL #2   Title Pt  will increase his FOTO score to at least 71 to demonstrate significant improvement in his function related to his leg weakness    Baseline 08/11/20: 60    Time 8    Period Weeks    Status New    Target Date 10/06/20      PT LONG TERM GOAL #3   Title Pt will report at least 75% improvement in his symptoms in order to be able to work in the yard longer without experiencing leg fatigue.    Time 8    Period Weeks    Status New    Target Date 10/06/20                 Plan - 08/27/20 0804    Clinical Impression Statement Pt arrives with excellent motivation to participate in therapy. Continued strengthening with patient today and introduced resisted gait. He does struggle some with resisted lateral stepping. He is able to complete all exercises again today without increase in back/LE pain or excessive fatigue. Pt encouraged to follow-up as scheduled. Pt will benefit from PT services to address deficits in strength, pain, and endurance in order to return to full function at home and when working out in the yard.    Personal Factors and Comorbidities Age;Comorbidity 3+;Fitness;Past/Current Experience;Time since onset of injury/illness/exacerbation    Comorbidities Stroke, HTN, hyperlipidemia, prostate and kidney cancer, CKD    Examination-Activity Limitations Bend;Carry;Stand;Transfers    Examination-Participation Restrictions Community Activity;Yard Work    Stability/Clinical Decision Making Unstable/Unpredictable    Rehab Potential Good    PT Frequency 2x / week    PT Duration 8 weeks    PT Treatment/Interventions ADLs/Self Care Home Management;Aquatic  Therapy;Biofeedback;Canalith Repostioning;Cryotherapy;Electrical Stimulation;Iontophoresis 4mg /ml Dexamethasone;Moist Heat;Traction;Ultrasound;DME Instruction;Gait training;Stair training;Functional mobility training;Therapeutic activities;Therapeutic exercise;Balance training;Neuromuscular re-education;Cognitive remediation;Patient/family education;Manual techniques;Passive range of motion;Dry needling;Vestibular;Spinal Manipulations;Joint Manipulations    PT Next Visit Plan Progress strengthening    PT Home Exercise Plan Access Code: [email or web address]    Consulted and Agree with Plan of Care Patient           Patient will benefit from skilled therapeutic intervention in order to improve the following deficits and impairments:  Decreased strength,Decreased endurance,Difficulty walking  Visit Diagnosis: Muscle weakness (generalized)  Difficulty in walking, not elsewhere classified     Problem List Patient Active Problem List   Diagnosis Date Noted  . Leg pain 06/22/2020  . Personal history of colonic polyps   . Polyp of descending colon   . CKD (chronic kidney disease) stage 3, GFR 30-59 ml/min (HCC) 12/28/2017  . Osteopenia 12/28/2017  . Polyp of gallbladder 12/28/2017  . Right bundle branch block 12/28/2017  . SAH (subarachnoid hemorrhage) ([place]) 11/11/2016  . Essential hypertension 11/09/2016  . Mixed hyperlipidemia 11/08/2016  . 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 Personal history of renal cell carcinoma 11/08/2016  . Cerebrovascular accident ([place]) 10/18/2016  . History of kidney cancer 01/19/2007  . 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[place]) 01/12/2001   Phillips Grout PT, DPT, GCS  Liela Rylee 08/27/2020, 1:28 PM  [facility] [address] [place], [place], [address] Phone: [phone]   Fax:  [phone]  Name: Jason Krause MRN: [id number] Date of Birth: January 05, 1946

## 2020-09-01 ENCOUNTER — Other Ambulatory Visit: Payer: Self-pay

## 2020-09-01 ENCOUNTER — Ambulatory Visit: Payer: Medicare PPO

## 2020-09-01 DIAGNOSIS — R262 Difficulty in walking, not elsewhere classified: Secondary | ICD-10-CM | POA: Diagnosis not present

## 2020-09-01 DIAGNOSIS — M6281 Muscle weakness (generalized): Secondary | ICD-10-CM | POA: Diagnosis not present

## 2020-09-01 NOTE — Therapy (Signed)
[facility] [address] [place], [place], [address] Phone: [phone]   Fax:  [phone]  Physical Therapy Treatment  Patient Details  Name: Jason Krause MRN: [id number] Date of Birth: December 31, 1945 Referring Provider (PT): Dr. Girtha Hake   Encounter Date: 09/01/2020   PT End of Session - 09/01/20 0803    Visit Number 6    Number of Visits 17    Date for PT Re-Evaluation 10/06/20    Authorization Type eval: 06/13/20    PT Start Time 0800    PT Stop Time 0845    PT Time Calculation (min) 45 min    Activity Tolerance Patient tolerated treatment well    Behavior During Therapy [facility] for tasks assessed/performed           Past Medical History:  Diagnosis Date  . Cancer [facility])    kidney and prostate  . 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 Hyperlipidemia   . Hypertension   . Prostate cancer ([place])   . Stroke ([place])    slight weakness in right foot    Past Surgical History:  Procedure Laterality Date  . COLONOSCOPY WITH PROPOFOL N/A 12/06/2019   Procedure: COLONOSCOPY and biopsy WITH PROPOFOL;  Surgeon: Lucilla Lame, MD;  Location: [place];  Service: Endoscopy;  Laterality: N/A;  priority 4  . HERNIA REPAIR    . kidney removal due to cancer Right   . POLYPECTOMY N/A 12/06/2019   Procedure: POLYPECTOMY;  Surgeon: Lucilla Lame, MD;  Location: [place];  Service: Endoscopy;  Laterality: N/A;  . PROSTATE SURGERY     removed    There were no vitals filed for this visit.   Subjective Assessment - 09/01/20 0801    Subjective Pt reports that he is doing well today. He continues to have some intermittent mild L groin pain but it is improving and he denies any resting pain upon arrival today. No excessive soreness after last therapy session. No specific questions or concerns currently.    Pertinent History Pt referred by Dr. Alba Destine for bilateral leg weakness, chronic  bilateral low back pain with bilateral sciatica, and foraminal stenosis of lumbar region. During interview pt reports his legs feel heavy and weak when he gets tired. States that they won't "do what I tell them to do." He first started noticing symptoms within the last 2-3 years with gradual worsening. 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 No history of back trauma or surgery. He denies any pain in his low back or legs. He states that in the last couple weeks he has developed L anterior groin pain but does not relate it to his leg weakness. MD discontinued Simvastatin a couple months ago for concerns that it may be affecting his legs but pt has not noticed any improvement in his symptoms since stopping. He reports some "abnormal sensations in his bilateral anterior thighs" but is unable to describe the sensation further. He is able to walk/work in yard for about an hour before the symptoms start. Denies any numbness in legs or feet. If he stands too long he does notice that he starts leaning toward to the right side and his wife has told him multiple times that he is leaning to the right. Lumbar MRI from 07/03/20 showed minimal retrolisthesis of L2 on L3 and L3 on L4. Lumbar spine spondylosis most  notable from L2 through L5 with moderate neural foraminal narrowing and mild central canal stenosis. He states that he has had what he thinks is a NCV study but no results found in the chart upon review. He has an appointment to see Dr. Melrose Nakayama at [facility] neurology this Wednesday. 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 Remote R achilles tendon repair many years ago.    Diagnostic tests See history    Patient Stated Goals "I would like for my legs to feel normal"    Currently in Pain? No/denies    Pain Onset 1 to 4 weeks ago               TREATMENT   Ther-ex Warm-up on NuStep alternating  between L2 and L5/6 in 45s intervals with therapist adjusting resistance to match intensity x32minutes (1 minute warm-up and cool-down), brief history obtained; Hooklying posterior pelvic tilt 5s hold x 10, verbal and tactile cues; Hooklying lumbar rotation knee rocks x 1 minute; Hooklying lumbar rotation stretch x 45s bilateral; Single knee to chest stretch 45s hold bilateral; Hooklying bridges x 15; Hooklying SLR with transverse abdominis contraction x 10 BLE; Sidelying straight leg abduction with tactile cues from therapist to avoid compensatory movements x 15 BLE; Sidelying clams with manual resistance from therapist x 15 BLE; Sit to standwith 10# medicine ball from low mat table 2 x 15; Resisted side stepping with green tband around ankles 2 x 1 minute; Standing heel raises with BUE support x 20; 6" alternating step-ups with Airex pad on top of step (8" total height) without UE support x 10;   Pt educated throughout session about proper posture and technique with exercises. Improved exercise technique, movement at target joints, use of target muscles after min to mod verbal, visual, tactile cues.   Pt arrives with excellent motivation to participate in therapy.Utilized stretching again today for low back and hips to improve tissue elasticity and prepare for exercises. Continuedstrengthening with patient todayandhe denies any increase in his L groin pain during session today. He demonstrates good progress toward his goal of increased leg strength with less back and leg pain. Pt encouraged to follow-up as scheduled.Pt will benefit from PT services to address deficits in strength, pain, and endurancein order to return to full function at homeand when working out in the yard.                         PT Short Term Goals - 08/11/20 1333      PT SHORT TERM GOAL #1   Title Pt will be independent with HEP to improve leg strength in order to improve function at  home and out in his yard.    Time 4    Period Weeks    Status New    Target Date 09/08/20             PT Long Term Goals - 08/11/20 1333      PT LONG TERM GOAL #1   Title Pt will be able to work in the yard for at least 2 hours before his legs start to feel tired and fatigued so he can care for his property    Baseline 08/11/20: Legs start to feel week after working in the yard for around 1 hour    Time 8    Period Weeks    Status New    Target Date  10/06/20      PT LONG TERM GOAL #2   Title Pt will increase his FOTO score to at least 71 to demonstrate significant improvement in his function related to his leg weakness    Baseline 08/11/20: 60    Time 8    Period Weeks    Status New    Target Date 10/06/20      PT LONG TERM GOAL #3   Title Pt will report at least 75% improvement in his symptoms in order to be able to work in the yard longer without experiencing leg fatigue.    Time 8    Period Weeks    Status New    Target Date 10/06/20                 Plan - 09/01/20 0804    Clinical Impression Statement Pt arrives with excellent motivation to participate in therapy. Utilized stretching again today for low back and hips to improve tissue elasticity and prepare for exercises. Continued strengthening with patient today and he denies any increase in his L groin pain during session today. He demonstrates good progress toward his goal of increased leg strength with less back and leg pain. Pt encouraged to follow-up as scheduled. Pt will benefit from PT services to address deficits in strength, pain, and endurance in order to return to full function at home and when working out in the yard.    Personal Factors and Comorbidities Age;Comorbidity 3+;Fitness;Past/Current Experience;Time since onset of injury/illness/exacerbation    Comorbidities Stroke, HTN, hyperlipidemia, prostate and kidney cancer, CKD    Examination-Activity Limitations Bend;Carry;Stand;Transfers     Examination-Participation Restrictions Community Activity;Yard Work    Stability/Clinical Decision Making Unstable/Unpredictable    Rehab Potential Good    PT Frequency 2x / week    PT Duration 8 weeks    PT Treatment/Interventions ADLs/Self Care Home Management;Aquatic Therapy;Biofeedback;Canalith Repostioning;Cryotherapy;Electrical Stimulation;Iontophoresis 4mg /ml Dexamethasone;Moist Heat;Traction;Ultrasound;DME Instruction;Gait training;Stair training;Functional mobility training;Therapeutic activities;Therapeutic exercise;Balance training;Neuromuscular re-education;Cognitive remediation;Patient/family education;Manual techniques;Passive range of motion;Dry needling;Vestibular;Spinal Manipulations;Joint Manipulations    PT Next Visit Plan Progress strengthening    PT Home Exercise Plan Access Code: [email or web address]    Consulted and Agree with Plan of Care Patient           Patient will benefit from skilled therapeutic intervention in order to improve the following deficits and impairments:  Decreased strength,Decreased endurance,Difficulty walking  Visit Diagnosis: Muscle weakness (generalized)  Difficulty in walking, not elsewhere classified     Problem List Patient Active Problem List   Diagnosis Date Noted  . Leg pain 06/22/2020  . Personal history of colonic polyps   . Polyp of descending colon   . CKD (chronic kidney disease) stage 3, GFR 30-59 ml/min (HCC) 12/28/2017  . Osteopenia 12/28/2017  . Polyp of gallbladder 12/28/2017  . Right bundle branch block 12/28/2017  . SAH (subarachnoid hemorrhage) ([place]) 11/11/2016  . Essential hypertension 11/09/2016  . Mixed hyperlipidemia 11/08/2016  . 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 Personal history of renal cell carcinoma 11/08/2016  . Cerebrovascular accident ([place]) 10/18/2016  . History of kidney cancer 01/19/2007  . 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[place]) 01/12/2001   Phillips Grout PT, DPT, GCS   Daytona Hedman 09/01/2020, 9:20 AM  [facility] [address] [place], [place], [address] Phone: [phone]   Fax:  [phone]  Name: Peterson Mathey MRN: [id number] Date of Birth: 15-Jun-1945

## 2020-09-01 NOTE — Patient Instructions (Addendum)
Access Code: [facility] URL: [email or web address] Date: 09/01/2020 Prepared by: Roxana Hires  Exercises Sit to Stand Without Arm Support - 1 x daily - 7 x weekly - 2 sets - 10 reps Heel rises with counter support - 1 x daily - 7 x weekly - 2 sets - 10 reps - 3s hold Seated Hip Abduction with Resistance - 1 x daily - 7 x weekly - 2 sets - 10 reps - 3s hold Seated Hip Adduction Squeeze with Ball - 1 x daily - 7 x weekly - 2 sets - 10 reps - 3s hold Hooklying Single Knee to Chest Stretch - 1 x daily - 7 x weekly - 3 reps - 30s (both hips) hold Supine Piriformis Stretch with Foot on Ground - 1 x daily - 7 x weekly - 3 reps - 30s (both hips) hold Supine Figure 4 Piriformis Stretch - 1 x daily - 7 x weekly - 3 reps - 30s (both hips) hold

## 2020-09-02 DIAGNOSIS — R29898 Other symptoms and signs involving the musculoskeletal system: Secondary | ICD-10-CM | POA: Insufficient documentation

## 2020-09-03 ENCOUNTER — Other Ambulatory Visit: Payer: Self-pay

## 2020-09-03 ENCOUNTER — Ambulatory Visit: Payer: Medicare PPO

## 2020-09-03 DIAGNOSIS — M6281 Muscle weakness (generalized): Secondary | ICD-10-CM | POA: Diagnosis not present

## 2020-09-03 DIAGNOSIS — R262 Difficulty in walking, not elsewhere classified: Secondary | ICD-10-CM

## 2020-09-03 NOTE — Patient Instructions (Signed)
Access Code: [facility] URL: [email or web address] Date: 09/03/2020 Prepared by: Roxana Hires  Exercises Mini Squat with Counter Support - 1 x daily - 7 x weekly - 2 sets - 10 reps Sit to Stand Without Arm Support - 1 x daily - 7 x weekly - 2 sets - 10 reps Heel rises with counter support - 1 x daily - 7 x weekly - 2 sets - 10 reps - 3s hold Seated Hip Abduction with Resistance - 1 x daily - 7 x weekly - 2 sets - 10 reps - 3s hold Seated Hip Adduction Squeeze with Ball - 1 x daily - 7 x weekly - 2 sets - 10 reps - 3s hold Hooklying Single Knee to Chest Stretch - 1 x daily - 7 x weekly - 3 reps - 30s (both hips) hold Supine Piriformis Stretch with Foot on Ground - 1 x daily - 7 x weekly - 3 reps - 30s (both hips) hold Supine Figure 4 Piriformis Stretch - 1 x daily - 7 x weekly - 3 reps - 30s (both hips) hold

## 2020-09-03 NOTE — Therapy (Signed)
[facility] [address] [place], [place], [address] Phone: [phone]   Fax:  [phone]  Physical Therapy Treatment  Patient Details  Name: Jason Krause MRN: [id number] Date of Birth: 02/20/1946 Referring Provider (PT): Dr. Girtha Hake   Encounter Date: 09/03/2020   PT End of Session - 09/03/20 0803    Visit Number 7    Number of Visits 17    Date for PT Re-Evaluation 10/06/20    Authorization Type eval: 08/11/20    PT Start Time 0800    PT Stop Time 0845    PT Time Calculation (min) 45 min    Activity Tolerance Patient tolerated treatment well    Behavior During Therapy [facility] for tasks assessed/performed           Past Medical History:  Diagnosis Date  . Cancer [facility])    kidney and prostate  . 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 Hyperlipidemia   . Hypertension   . Prostate cancer ([place])   . Stroke ([place])    slight weakness in right foot    Past Surgical History:  Procedure Laterality Date  . COLONOSCOPY WITH PROPOFOL N/A 12/06/2019   Procedure: COLONOSCOPY and biopsy WITH PROPOFOL;  Surgeon: Lucilla Lame, MD;  Location: [place];  Service: Endoscopy;  Laterality: N/A;  priority 4  . HERNIA REPAIR    . kidney removal due to cancer Right   . POLYPECTOMY N/A 12/06/2019   Procedure: POLYPECTOMY;  Surgeon: Lucilla Lame, MD;  Location: [place];  Service: Endoscopy;  Laterality: N/A;  . PROSTATE SURGERY     removed    There were no vitals filed for this visit.   Subjective Assessment - 09/03/20 0802    Subjective Pt reports that he is doing well today. He continues to have some intermittent mild L groin pain but it is improving and he denies any resting pain upon arrival today. No soreness after last therapy session. No specific questions or concerns currently.    Pertinent History Pt referred by Dr. Alba Destine for bilateral leg weakness, chronic bilateral low  back pain with bilateral sciatica, and foraminal stenosis of lumbar region. During interview pt reports his legs feel heavy and weak when he gets tired. States that they won't "do what I tell them to do." He first started noticing symptoms within the last 2-3 years with gradual worsening. 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 No history of back trauma or surgery. He denies any pain in his low back or legs. He states that in the last couple weeks he has developed L anterior groin pain but does not relate it to his leg weakness. MD discontinued Simvastatin a couple months ago for concerns that it may be affecting his legs but pt has not noticed any improvement in his symptoms since stopping. He reports some "abnormal sensations in his bilateral anterior thighs" but is unable to describe the sensation further. He is able to walk/work in yard for about an hour before the symptoms start. Denies any numbness in legs or feet. If he stands too long he does notice that he starts leaning toward to the right side and his wife has told him multiple times that he is leaning to the right. Lumbar MRI from 07/03/20 showed minimal retrolisthesis of L2 on L3 and L3 on L4. Lumbar spine spondylosis most notable  from L2 through L5 with moderate neural foraminal narrowing and mild central canal stenosis. He states that he has had what he thinks is a NCV study but no results found in the chart upon review. He has an appointment to see Dr. Melrose Nakayama at [facility] neurology this Wednesday. 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 Remote R achilles tendon repair many years ago.    Diagnostic tests See history    Patient Stated Goals "I would like for my legs to feel normal"    Currently in Pain? No/denies               TREATMENT   Ther-ex Warm-up on NuStep alternating between L2 and L5/6 in 45s intervals with  therapist adjusting resistance to match intensity x54minutes (1 minute warm-up and cool-down), brief history obtained; Hooklying posterior pelvic tilt 5s hold x 10, verbal and tactile cues; Hooklying lumbar rotation knee rocks x 1 minute; Hooklying lumbar rotation stretch x 45s bilateral; Single knee to chest stretch 45s hold bilateral; Hooklying bridges x 15; Hooklying bridge holds with alternating LE marching x 10 BLE; Hooklying SLR with transverse abdominis contraction x 10 BLE; Sit to standwith 10# medicine ball from low mat table 2 x 15; Resisted gait with Nautilus machine, 80#, forward, R lateral, L lateral, and backwards x 5 each direction; Nautilus shoulder extension from 90 to thighs with 40# and cues for transverse abdominis contraction and to prevent lumbar extension 2 x 10; 6" lateral step downs with BUE support x 10 on each side;   Pt educated throughout session about proper posture and technique with exercises. Improved exercise technique, movement at target joints, use of target muscles after min to mod verbal, visual, tactile cues.   Pt arrives with excellent motivation to participate in therapy.Utilized stretching again today for low back and hips to improve tissue elasticity and prepare for exercises. Continuedstrengthening with patient todayandhe continues to deny any increase in his L groin pain during session today. Repeated resisted gait during session today. He demonstrates good progress toward his goal of increased leg strength with less back and leg pain. Pt encouraged to follow-up as scheduled.Pt will benefit from PT services to address deficits in strength, pain, and endurancein order to return to full function at homeand when working out in the yard.                          PT Short Term Goals - 08/11/20 1333      PT SHORT TERM GOAL #1   Title Pt will be independent with HEP to improve leg strength in order to improve function  at home and out in his yard.    Time 4    Period Weeks    Status New    Target Date 09/08/20             PT Long Term Goals - 08/11/20 1333      PT LONG TERM GOAL #1   Title Pt will be able to work in the yard for at least 2 hours before his legs start to feel tired and fatigued so he can care for his property    Baseline 08/11/20: Legs start to feel week after working in the yard for around 1 hour    Time 8    Period Weeks    Status New    Target Date 10/06/20  PT LONG TERM GOAL #2   Title Pt will increase his FOTO score to at least 71 to demonstrate significant improvement in his function related to his leg weakness    Baseline 08/11/20: 60    Time 8    Period Weeks    Status New    Target Date 10/06/20      PT LONG TERM GOAL #3   Title Pt will report at least 75% improvement in his symptoms in order to be able to work in the yard longer without experiencing leg fatigue.    Time 8    Period Weeks    Status New    Target Date 10/06/20                 Plan - 09/03/20 0804    Clinical Impression Statement Pt arrives with excellent motivation to participate in therapy. Utilized stretching again today for low back and hips to improve tissue elasticity and prepare for exercises. Continued strengthening with patient today and he continues to deny any increase in his L groin pain during session today. Repeated resisted gait during session today. He demonstrates good progress toward his goal of increased leg strength with less back and leg pain. Pt encouraged to follow-up as scheduled. Pt will benefit from PT services to address deficits in strength, pain, and endurance in order to return to full function at home and when working out in the yard.    Personal Factors and Comorbidities Age;Comorbidity 3+;Fitness;Past/Current Experience;Time since onset of injury/illness/exacerbation    Comorbidities Stroke, HTN, hyperlipidemia, prostate and kidney cancer, CKD     Examination-Activity Limitations Bend;Carry;Stand;Transfers    Examination-Participation Restrictions Community Activity;Yard Work    Stability/Clinical Decision Making Unstable/Unpredictable    Rehab Potential Good    PT Frequency 2x / week    PT Duration 8 weeks    PT Treatment/Interventions ADLs/Self Care Home Management;Aquatic Therapy;Biofeedback;Canalith Repostioning;Cryotherapy;Electrical Stimulation;Iontophoresis 4mg /ml Dexamethasone;Moist Heat;Traction;Ultrasound;DME Instruction;Gait training;Stair training;Functional mobility training;Therapeutic activities;Therapeutic exercise;Balance training;Neuromuscular re-education;Cognitive remediation;Patient/family education;Manual techniques;Passive range of motion;Dry needling;Vestibular;Spinal Manipulations;Joint Manipulations    PT Next Visit Plan Progress strengthening    PT Home Exercise Plan Access Code: [email or web address]    Consulted and Agree with Plan of Care Patient           Patient will benefit from skilled therapeutic intervention in order to improve the following deficits and impairments:  Decreased strength,Decreased endurance,Difficulty walking  Visit Diagnosis: Muscle weakness (generalized)  Difficulty in walking, not elsewhere classified     Problem List Patient Active Problem List   Diagnosis Date Noted  . Leg pain 06/22/2020  . Personal history of colonic polyps   . Polyp of descending colon   . CKD (chronic kidney disease) stage 3, GFR 30-59 ml/min (HCC) 12/28/2017  . Osteopenia 12/28/2017  . Polyp of gallbladder 12/28/2017  . Right bundle branch block 12/28/2017  . SAH (subarachnoid hemorrhage) ([place]) 11/11/2016  . Essential hypertension 11/09/2016  . Mixed hyperlipidemia 11/08/2016  . 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 Personal history of renal cell carcinoma 11/08/2016  . Cerebrovascular accident ([place]) 10/18/2016  . History of kidney cancer 01/19/2007  .  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[place]) 01/12/2001   Phillips Grout PT, DPT, GCS  Godson Pollan 09/04/2020, 11:12 AM  [facility] [facility] [address] [place], [place], [address] Phone: [phone]   Fax:  [phone]  Name: Jason Krause MRN: [id number] Date of Birth: 08/17/1945

## 2020-09-08 DIAGNOSIS — R29898 Other symptoms and signs involving the musculoskeletal system: Secondary | ICD-10-CM | POA: Diagnosis not present

## 2020-09-08 DIAGNOSIS — M5442 Lumbago with sciatica, left side: Secondary | ICD-10-CM | POA: Diagnosis not present

## 2020-09-08 DIAGNOSIS — G8929 Other chronic pain: Secondary | ICD-10-CM | POA: Diagnosis not present

## 2020-09-08 DIAGNOSIS — M5441 Lumbago with sciatica, right side: Secondary | ICD-10-CM | POA: Diagnosis not present

## 2020-09-08 NOTE — Patient Instructions (Incomplete)
TREATMENT   Ther-ex Warm-up on NuStepalternating between L2 and L5/6 in 45sintervals with therapist adjusting resistanceto match intensityx71minutes (1 minute warm-up and cool-down), brief history obtained; Hooklying posterior pelvic tilt 5s hold x 10, verbal and tactile cues; Hooklying lumbar rotation knee rocks x 1 minute; Hooklying lumbar rotationstretch x 45s bilateral; Single knee to chest stretch 45s hold bilateral; Hooklying bridges x 15; Hooklying bridge holds with alternating LE marching x 10 BLE; Hooklying SLR with transverse abdominis contraction x 10 BLE; Sit to standwith10#medicine ball from low mat table 2x 15; Resisted gait with Nautilus machine, 80#, forward, R lateral, L lateral, and backwards x 5 each direction; Nautilus shoulder extension from 90 to thighs with 40# and cues for transverse abdominis contraction and to prevent lumbar extension 2 x 10; 6" lateral step downs with BUE support x 10 on each side;   Pt educated throughout session about proper posture and technique with exercises. Improved exercise technique, movement at target joints, use of target muscles after min to mod verbal, visual, tactile cues.   Pt arrives with excellent motivation to participate in therapy.Utilized stretching again today for low back and hips to improve tissue elasticity and prepare for exercises. Continuedstrengthening with patient todayandhe continues to deny any increase in his L groin pain during session today. Repeated resisted gait during session today. He demonstrates good progress toward his goal of increased leg strength with less back and leg pain. Pt encouraged to follow-up as scheduled.Pt will benefit from PT services to address deficits in strength, pain, and endurancein order to return to full function at homeand when working out in the yard.

## 2020-09-10 ENCOUNTER — Other Ambulatory Visit: Payer: Self-pay

## 2020-09-10 ENCOUNTER — Ambulatory Visit: Payer: Medicare PPO

## 2020-09-10 DIAGNOSIS — M6281 Muscle weakness (generalized): Secondary | ICD-10-CM | POA: Diagnosis not present

## 2020-09-10 DIAGNOSIS — R262 Difficulty in walking, not elsewhere classified: Secondary | ICD-10-CM

## 2020-09-10 DIAGNOSIS — G4733 Obstructive sleep apnea (adult) (pediatric): Secondary | ICD-10-CM | POA: Diagnosis not present

## 2020-09-10 NOTE — Therapy (Signed)
[facility] [address]. [place], [place], [address] Phone: [phone]   Fax:  [phone]  Physical Therapy Treatment  Patient Details  Name: Jason Krause MRN: [id number] Date of Birth: Jul 09, 1945 Referring Provider (PT): Dr. Girtha Hake   Encounter Date: 09/10/2020   PT End of Session - 09/10/20 0803    Visit Number 8    Number of Visits 17    Date for PT Re-Evaluation 10/06/20    Authorization Type eval: 08/11/20    PT Start Time 0758    PT Stop Time 0842    PT Time Calculation (min) 44 min    Activity Tolerance Patient tolerated treatment well    Behavior During Therapy [facility] for tasks assessed/performed           Past Medical History:  Diagnosis Date  . Cancer [facility])    kidney and prostate  . 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 Hyperlipidemia   . Hypertension   . Prostate cancer ([place])   . Stroke ([place])    slight weakness in right foot    Past Surgical History:  Procedure Laterality Date  . COLONOSCOPY WITH PROPOFOL N/A 12/06/2019   Procedure: COLONOSCOPY and biopsy WITH PROPOFOL;  Surgeon: Lucilla Lame, MD;  Location: [place];  Service: Endoscopy;  Laterality: N/A;  priority 4  . HERNIA REPAIR    . kidney removal due to cancer Right   . POLYPECTOMY N/A 12/06/2019   Procedure: POLYPECTOMY;  Surgeon: Lucilla Lame, MD;  Location: [place];  Service: Endoscopy;  Laterality: N/A;  . PROSTATE SURGERY     removed    There were no vitals filed for this visit.   Subjective Assessment - 09/10/20 0801    Subjective Pt reports that he is doing well today. He saw Dr. Alba Destine who felt his symptoms were more likely related to peripheral neuropathy than spinal issues. He continues to have some intermittent mild L groin pain but it is improving and he denies any resting pain upon arrival today. No soreness after last therapy session. No specific questions or concerns  currently.    Pertinent History Pt referred by Dr. Alba Destine for bilateral leg weakness, chronic bilateral low back pain with bilateral sciatica, and foraminal stenosis of lumbar region. During interview pt reports his legs feel heavy and weak when he gets tired. States that they won't "do what I tell them to do." He first started noticing symptoms within the last 2-3 years with gradual worsening. 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 No history of back trauma or surgery. He denies any pain in his low back or legs. He states that in the last couple weeks he has developed L anterior groin pain but does not relate it to his leg weakness. MD discontinued Simvastatin a couple months ago for concerns that it may be affecting his legs but pt has not noticed any improvement in his symptoms since stopping. He reports some "abnormal sensations in his bilateral anterior thighs" but is unable to describe the sensation further. He is able to walk/work in yard for about an hour before the symptoms start. Denies any numbness in legs or feet. If he stands too long he does notice that he starts leaning toward to the right side and his wife has told him multiple times that he is leaning to the right. Lumbar MRI  from 07/03/20 showed minimal retrolisthesis of L2 on L3 and L3 on L4. Lumbar spine spondylosis most notable from L2 through L5 with moderate neural foraminal narrowing and mild central canal stenosis. He states that he has had what he thinks is a NCV study but no results found in the chart upon review. He has an appointment to see Dr. Melrose Nakayama at [facility] neurology this Wednesday. 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 Remote R achilles tendon repair many years ago.    Diagnostic tests See history    Patient Stated Goals "I would like for my legs to feel normal"    Currently in Pain? No/denies               TREATMENT   Ther-ex Warm-up on NuStepalternating between L2 and L6 in 45sintervals with therapist adjusting resistanceto match intensityx66minutes (1 minute warm-up and cool-down), brief history obtained; Hooklying posterior pelvic tilt 5s hold x 10, verbal and tactile cues; Hooklying lumbar rotation knee rocks x 1 minute; Hooklying SLR with 2# ankle weight (AW) transverse abdominis contraction 2 x 10 BLE; Hooklying bridges 5s hold, 2 x 10; Hooklying bridge holds with alternating LE marching with 2# AW x 10 BLE; Sidelying straight leg hip abduction 2# 2 x 10; Sidelying clams with manual resistance 2 x 10; Sit to standwith10#medicine ball overhead press from low mat table 2x 15; Resisted gait with Nautilus machine, 80#, forward, R lateral, L lateral, and backwards x 5 each direction;   Pt educated throughout session about proper posture and technique with exercises. Improved exercise technique, movement at target joints, use of target muscles after min to mod verbal, visual, tactile cues.   Pt arrives with excellent motivation to participate in therapy.Continuedstrengthening with patient todayandadded 2# ankle weights for mat table exercises. He reports a mild increase in his L groin pain during SLR. He demonstrates good progress toward his goal of increased leg strength with less back and leg pain. Pt encouraged to follow-up as scheduled.Pt will benefit from PT services to address deficits in strength, pain, and endurancein order to return to full function at homeand when working out in the yard.                           PT Short Term Goals - 08/11/20 1333      PT SHORT TERM GOAL #1   Title Pt will be independent with HEP to improve leg strength in order to improve function at home and out in his yard.    Time 4    Period Weeks    Status New    Target Date 09/08/20             PT Long Term Goals - 08/11/20 1333       PT LONG TERM GOAL #1   Title Pt will be able to work in the yard for at least 2 hours before his legs start to feel tired and fatigued so he can care for his property    Baseline 08/11/20: Legs start to feel week after working in the yard for around 1 hour    Time 8    Period Weeks    Status New    Target Date 10/06/20      PT LONG TERM GOAL #2   Title Pt will increase his FOTO score to at least 71 to demonstrate significant improvement in his  function related to his leg weakness    Baseline 08/11/20: 60    Time 8    Period Weeks    Status New    Target Date 10/06/20      PT LONG TERM GOAL #3   Title Pt will report at least 75% improvement in his symptoms in order to be able to work in the yard longer without experiencing leg fatigue.    Time 8    Period Weeks    Status New    Target Date 10/06/20                 Plan - 09/10/20 0803    Clinical Impression Statement Pt arrives with excellent motivation to participate in therapy. Continued strengthening with patient today and added 2# ankle weights for mat table exercises. He reports a mild increase in his L groin pain during SLR. He demonstrates good progress toward his goal of increased leg strength with less back and leg pain. Pt encouraged to follow-up as scheduled. Pt will benefit from PT services to address deficits in strength, pain, and endurance in order to return to full function at home and when working out in the yard.    Personal Factors and Comorbidities Age;Comorbidity 3+;Fitness;Past/Current Experience;Time since onset of injury/illness/exacerbation    Comorbidities Stroke, HTN, hyperlipidemia, prostate and kidney cancer, CKD    Examination-Activity Limitations Bend;Carry;Stand;Transfers    Examination-Participation Restrictions Community Activity;Yard Work    Stability/Clinical Decision Making Unstable/Unpredictable    Rehab Potential Good    PT Frequency 2x / week    PT Duration 8 weeks    PT  Treatment/Interventions ADLs/Self Care Home Management;Aquatic Therapy;Biofeedback;Canalith Repostioning;Cryotherapy;Electrical Stimulation;Iontophoresis 4mg /ml Dexamethasone;Moist Heat;Traction;Ultrasound;DME Instruction;Gait training;Stair training;Functional mobility training;Therapeutic activities;Therapeutic exercise;Balance training;Neuromuscular re-education;Cognitive remediation;Patient/family education;Manual techniques;Passive range of motion;Dry needling;Vestibular;Spinal Manipulations;Joint Manipulations    PT Next Visit Plan Progress strengthening    PT Home Exercise Plan Access Code: [email or web address]    Consulted and Agree with Plan of Care Patient           Patient will benefit from skilled therapeutic intervention in order to improve the following deficits and impairments:  Decreased strength,Decreased endurance,Difficulty walking  Visit Diagnosis: Muscle weakness (generalized)  Difficulty in walking, not elsewhere classified     Problem List Patient Active Problem List   Diagnosis Date Noted  . Leg pain 06/22/2020  . Personal history of colonic polyps   . Polyp of descending colon   . CKD (chronic kidney disease) stage 3, GFR 30-59 ml/min (HCC) 12/28/2017  . Osteopenia 12/28/2017  . Polyp of gallbladder 12/28/2017  . Right bundle branch block 12/28/2017  . SAH (subarachnoid hemorrhage) ([place]) 11/11/2016  . Essential hypertension 11/09/2016  . Mixed hyperlipidemia 11/08/2016  . 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 Personal history of renal cell carcinoma 11/08/2016  . Cerebrovascular accident ([place]) 10/18/2016  . History of kidney cancer 01/19/2007  . 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[place]) 01/12/2001   Phillips Grout PT, DPT, GCS  Dakwan Pridgen 09/10/2020, 9:59 AM  [facility] [address] [place], [place], [address] Phone: [phone]   Fax:  [phone]  Name: Manjot Hinks MRN:  [id number] Date of Birth: [birth year]

## 2020-09-10 NOTE — Patient Instructions (Addendum)
Access Code: [facility] URL: [email or web address] Date: 09/11/2020 Prepared by: Roxana Hires  Exercises Mini Squat with Counter Support - 1 x daily - 7 x weekly - 2 sets - 10 reps Heel rises with counter support - 1 x daily - 7 x weekly - 2 sets - 20 reps - 3s hold Sit to Stand Without Arm Support - 1 x daily - 7 x weekly - 2 sets - 10 reps Seated Ankle Dorsiflexion with Resistance - 1 x daily - 7 x weekly - 2 sets - 10 reps - 3s hold Seated Hip Abduction with Resistance - 1 x daily - 7 x weekly - 2 sets - 10 reps - 3s hold Seated Hip Adduction Squeeze with Ball - 1 x daily - 7 x weekly - 2 sets - 10 reps - 3s hold Hooklying Single Knee to Chest Stretch - 1 x daily - 7 x weekly - 3 reps - 30s (both hips) hold Supine Piriformis Stretch with Foot on Ground - 1 x daily - 7 x weekly - 3 reps - 30s (both hips) hold Supine Figure 4 Piriformis Stretch - 1 x daily - 7 x weekly - 3 reps - 30s (both hips) hold

## 2020-09-11 ENCOUNTER — Ambulatory Visit: Payer: Medicare PPO

## 2020-09-11 DIAGNOSIS — R262 Difficulty in walking, not elsewhere classified: Secondary | ICD-10-CM | POA: Diagnosis not present

## 2020-09-11 DIAGNOSIS — M6281 Muscle weakness (generalized): Secondary | ICD-10-CM

## 2020-09-11 NOTE — Therapy (Signed)
[facility] [address]. [place], [place], [address] Phone: [phone]   Fax:  [phone]  Physical Therapy Treatment  Patient Details  Name: Jason Krause MRN: [id number] Date of Birth: 10-Feb-1946 Referring Provider (PT): Dr. Girtha Hake   Encounter Date: 09/11/2020   PT End of Session - 09/11/20 1137    Visit Number 9    Number of Visits 17    Date for PT Re-Evaluation 10/06/20    Authorization Type eval: 08/11/20    PT Start Time 0845    PT Stop Time 0930    PT Time Calculation (min) 45 min    Activity Tolerance Patient tolerated treatment well    Behavior During Therapy [facility] for tasks assessed/performed           Past Medical History:  Diagnosis Date  . Cancer [facility])    kidney and prostate  . 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 Hyperlipidemia   . Hypertension   . Prostate cancer ([place])   . Stroke ([place])    slight weakness in right foot    Past Surgical History:  Procedure Laterality Date  . COLONOSCOPY WITH PROPOFOL N/A 12/06/2019   Procedure: COLONOSCOPY and biopsy WITH PROPOFOL;  Surgeon: Lucilla Lame, MD;  Location: [place];  Service: Endoscopy;  Laterality: N/A;  priority 4  . HERNIA REPAIR    . kidney removal due to cancer Right   . POLYPECTOMY N/A 12/06/2019   Procedure: POLYPECTOMY;  Surgeon: Lucilla Lame, MD;  Location: [place];  Service: Endoscopy;  Laterality: N/A;  . PROSTATE SURGERY     removed    There were no vitals filed for this visit.   Subjective Assessment - 09/11/20 0852    Subjective Pt reports that he is doing well today. He continues to have some intermittent mild L groin pain but it is improving and he denies any resting pain upon arrival today. No soreness after last therapy session. No specific questions or concerns currently.    Pertinent History Pt referred by Dr. Alba Destine for bilateral leg weakness, chronic bilateral low  back pain with bilateral sciatica, and foraminal stenosis of lumbar region. During interview pt reports his legs feel heavy and weak when he gets tired. States that they won't "do what I tell them to do." He first started noticing symptoms within the last 2-3 years with gradual worsening. 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 No history of back trauma or surgery. He denies any pain in his low back or legs. He states that in the last couple weeks he has developed L anterior groin pain but does not relate it to his leg weakness. MD discontinued Simvastatin a couple months ago for concerns that it may be affecting his legs but pt has not noticed any improvement in his symptoms since stopping. He reports some "abnormal sensations in his bilateral anterior thighs" but is unable to describe the sensation further. He is able to walk/work in yard for about an hour before the symptoms start. Denies any numbness in legs or feet. If he stands too long he does notice that he starts leaning toward to the right side and his wife has told him multiple times that he is leaning to the right. Lumbar MRI from 07/03/20 showed minimal retrolisthesis of L2 on L3 and L3 on L4. Lumbar spine spondylosis most notable  from L2 through L5 with moderate neural foraminal narrowing and mild central canal stenosis. He states that he has had what he thinks is a NCV study but no results found in the chart upon review. He has an appointment to see Dr. Melrose Nakayama at [facility] neurology this Wednesday. 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 Remote R achilles tendon repair many years ago.    Diagnostic tests See history    Patient Stated Goals "I would like for my legs to feel normal"    Currently in Pain? No/denies                TREATMENT   Ther-ex Warm-up on NuStepalternating between L2 and L5/6 in 45sintervals with  therapist adjusting resistanceto match intensityx62minutes (1 minute warm-up and cool-down), brief history obtained; Forward BOSU (round side up) lunges without UE support  BOSU (flat side up) static stability balance 30s x 2; BOSU (flat side up) mini squats 2 x 10;  Standing exercises with 5# ankle weights: Hip flexion marches x 1 minute; HS curls x 1 minute; Hip abduction x 1 minute; Hip extension x 1 minute;  Seated green tband resisted R ankle DF 2 x 10; Standing heel raises 2 x 20; Standing 1/2 foam roll A/P orientation static balance for ankle strengthening x 60s; Standing 1/2 foam roll A/P orientation heel/toe rocking ankle strengthening x 15 each direction; Standing 1/2 foam roll static tandem balance for ankle strengthening x 60s;   Pt educated throughout session about proper posture and technique with exercises. Improved exercise technique, movement at target joints, use of target muscles after min to mod verbal, visual, tactile cues.   Pt arrives with excellent motivation to participate in therapy.Continuedstrengthening with patient todayandadded R ankle dorsiflexion strengthening due to reported tripping occasionally on RLE. HEP updated to include this exercise as well. Included additional ankle exercises during session today. He demonstrates good progress toward his goal of increased leg strength with less back and leg pain. Pt encouraged to follow-up as scheduled.Pt will benefit from PT services to address deficits in strength, pain, and endurancein order to return to full function at homeand when working out in the yard.                          PT Short Term Goals - 08/11/20 1333      PT SHORT TERM GOAL #1   Title Pt will be independent with HEP to improve leg strength in order to improve function at home and out in his yard.    Time 4    Period Weeks    Status New    Target Date 09/08/20             PT Long Term Goals -  08/11/20 1333      PT LONG TERM GOAL #1   Title Pt will be able to work in the yard for at least 2 hours before his legs start to feel tired and fatigued so he can care for his property    Baseline 08/11/20: Legs start to feel week after working in the yard for around 1 hour    Time 8    Period Weeks    Status New    Target Date 10/06/20      PT LONG TERM GOAL #2   Title Pt will increase his FOTO score to at least 71 to demonstrate significant  improvement in his function related to his leg weakness    Baseline 08/11/20: 60    Time 8    Period Weeks    Status New    Target Date 10/06/20      PT LONG TERM GOAL #3   Title Pt will report at least 75% improvement in his symptoms in order to be able to work in the yard longer without experiencing leg fatigue.    Time 8    Period Weeks    Status New    Target Date 10/06/20                 Plan - 09/11/20 0920    Clinical Impression Statement Pt arrives with excellent motivation to participate in therapy. Continued strengthening with patient today and added R ankle dorsiflexion strengthening due to reported tripping occasionally on RLE. HEP updated to include this exercise as well. Included additional ankle exercises during session today. He demonstrates good progress toward his goal of increased leg strength with less back and leg pain. Pt encouraged to follow-up as scheduled. Pt will benefit from PT services to address deficits in strength, pain, and endurance in order to return to full function at home and when working out in the yard.    Personal Factors and Comorbidities Age;Comorbidity 3+;Fitness;Past/Current Experience;Time since onset of injury/illness/exacerbation    Comorbidities Stroke, HTN, hyperlipidemia, prostate and kidney cancer, CKD    Examination-Activity Limitations Bend;Carry;Stand;Transfers    Examination-Participation Restrictions Community Activity;Yard Work    Stability/Clinical Decision Making  Unstable/Unpredictable    Rehab Potential Good    PT Frequency 2x / week    PT Duration 8 weeks    PT Treatment/Interventions ADLs/Self Care Home Management;Aquatic Therapy;Biofeedback;Canalith Repostioning;Cryotherapy;Electrical Stimulation;Iontophoresis 4mg /ml Dexamethasone;Moist Heat;Traction;Ultrasound;DME Instruction;Gait training;Stair training;Functional mobility training;Therapeutic activities;Therapeutic exercise;Balance training;Neuromuscular re-education;Cognitive remediation;Patient/family education;Manual techniques;Passive range of motion;Dry needling;Vestibular;Spinal Manipulations;Joint Manipulations    PT Next Visit Plan Progress strengthening    PT Home Exercise Plan Access Code: [email or web address]    Consulted and Agree with Plan of Care Patient           Patient will benefit from skilled therapeutic intervention in order to improve the following deficits and impairments:  Decreased strength,Decreased endurance,Difficulty walking  Visit Diagnosis: Muscle weakness (generalized)  Difficulty in walking, not elsewhere classified     Problem List Patient Active Problem List   Diagnosis Date Noted  . Leg pain 06/22/2020  . Personal history of colonic polyps   . Polyp of descending colon   . CKD (chronic kidney disease) stage 3, GFR 30-59 ml/min (HCC) 12/28/2017  . Osteopenia 12/28/2017  . Polyp of gallbladder 12/28/2017  . Right bundle branch block 12/28/2017  . SAH (subarachnoid hemorrhage) ([place]) 11/11/2016  . Essential hypertension 11/09/2016  . Mixed hyperlipidemia 11/08/2016  . 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 Personal history of renal cell carcinoma 11/08/2016  . Cerebrovascular accident ([place]) 10/18/2016  . History of kidney cancer 01/19/2007  . 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[place]) 01/12/2001   Phillips Grout PT, DPT, GCS  Graylin Sperling 09/11/2020, 11:41 AM  [facility] [address] [place], [place], [address] Phone: [phone]   Fax:  [phone]  Name: Jason Krause MRN: [id number] Date of Birth: November 01, 1945

## 2020-09-13 DIAGNOSIS — G4733 Obstructive sleep apnea (adult) (pediatric): Secondary | ICD-10-CM | POA: Diagnosis not present

## 2020-09-16 NOTE — Patient Instructions (Addendum)
Access Code: [facility] URL: [email or web address] Date: 09/17/2020 Prepared by: Roxana Hires  Exercises Mini Squat with Counter Support - 1 x daily - 7 x weekly - 2 sets - 10 reps Heel rises with counter support - 1 x daily - 7 x weekly - 2 sets - 20 reps - 3s hold Standing Hip Abduction with Resistance at Ankles and Counter Support - 1 x daily - 7 x weekly - 2 sets - 10 reps - 3s hold Standing Hip Extension with Resistance at Ankles and Unilateral Counter Support - 1 x daily - 7 x weekly - 2 sets - 10 reps - 3s hold Sit to Stand Without Arm Support - 1 x daily - 7 x weekly - 2 sets - 10 reps Seated Ankle Dorsiflexion with Resistance - 1 x daily - 7 x weekly - 2 sets - 10 reps - 3s hold Hooklying Single Knee to Chest Stretch - 1 x daily - 7 x weekly - 3 reps - 30s (both hips) hold Supine Piriformis Stretch with Foot on Ground - 1 x daily - 7 x weekly - 3 reps - 30s (both hips) hold Supine Figure 4 Piriformis Stretch - 1 x daily - 7 x weekly - 3 reps - 30s (both hips) hold

## 2020-09-17 ENCOUNTER — Other Ambulatory Visit: Payer: Self-pay

## 2020-09-17 ENCOUNTER — Ambulatory Visit: Payer: Medicare PPO | Attending: Physical Medicine & Rehabilitation

## 2020-09-17 DIAGNOSIS — R262 Difficulty in walking, not elsewhere classified: Secondary | ICD-10-CM | POA: Diagnosis not present

## 2020-09-17 DIAGNOSIS — M6281 Muscle weakness (generalized): Secondary | ICD-10-CM | POA: Diagnosis not present

## 2020-09-17 NOTE — Therapy (Signed)
[place] [facility] [address] [place], [place], [address] Phone: [phone]   Fax:  [phone]  Physical Therapy Progress Note   Dates of reporting period  08/11/20   to   09/17/20  Patient Details  Name: Jason Krause MRN: [id number] Date of Birth: 06/11/1945 Referring Provider (PT): Dr. Girtha Hake   Encounter Date: 09/17/2020   PT End of Session - 09/17/20 0810    Visit Number 10    Number of Visits 17    Date for PT Re-Evaluation 10/06/20    Authorization Type eval: 08/11/20    PT Start Time 0800    PT Stop Time 0845    PT Time Calculation (min) 45 min    Activity Tolerance Patient tolerated treatment well    Behavior During Therapy [facility] for tasks assessed/performed           Past Medical History:  Diagnosis Date  . Cancer [facility])    kidney and prostate  . 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 Hyperlipidemia   . Hypertension   . Prostate cancer ([place])   . Stroke ([place])    slight weakness in right foot    Past Surgical History:  Procedure Laterality Date  . COLONOSCOPY WITH PROPOFOL N/A 12/06/2019   Procedure: COLONOSCOPY and biopsy WITH PROPOFOL;  Surgeon: Lucilla Lame, MD;  Location: [place];  Service: Endoscopy;  Laterality: N/A;  priority 4  . HERNIA REPAIR    . kidney removal due to cancer Right   . POLYPECTOMY N/A 12/06/2019   Procedure: POLYPECTOMY;  Surgeon: Lucilla Lame, MD;  Location: [place];  Service: Endoscopy;  Laterality: N/A;  . PROSTATE SURGERY     removed    There were no vitals filed for this visit.   Subjective Assessment - 09/17/20 0809    Subjective Pt reports that he is doing well today. He continues to have some intermittent mild L groin pain but it is improving and he denies any resting pain upon arrival today. Overall he reports approximately 50% improvement since starting therapy. No specific questions or concerns currently.     Pertinent History Pt referred by Dr. Alba Destine for bilateral leg weakness, chronic bilateral low back pain with bilateral sciatica, and foraminal stenosis of lumbar region. During interview pt reports his legs feel heavy and weak when he gets tired. States that they won't "do what I tell them to do." He first started noticing symptoms within the last 2-3 years with gradual worsening. 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 No history of back trauma or surgery. He denies any pain in his low back or legs. He states that in the last couple weeks he has developed L anterior groin pain but does not relate it to his leg weakness. MD discontinued Simvastatin a couple months ago for concerns that it may be affecting his legs but pt has not noticed any improvement in his symptoms since stopping. He reports some "abnormal sensations in his bilateral anterior thighs" but is unable to describe the sensation further. He is able to walk/work in yard for about an hour before the symptoms start. Denies any numbness in legs or feet. If he stands too long he does notice that he starts leaning toward to the right side and his wife has told him multiple times that he is leaning to the right. Lumbar MRI  from 07/03/20 showed minimal retrolisthesis of L2 on L3 and L3 on L4. Lumbar spine spondylosis most notable from L2 through L5 with moderate neural foraminal narrowing and mild central canal stenosis. He states that he has had what he thinks is a NCV study but no results found in the chart upon review. He has an appointment to see Dr. Melrose Nakayama at [facility] neurology this Wednesday. 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 Remote R achilles tendon repair many years ago.    Diagnostic tests See history    Patient Stated Goals "I would like for my legs to feel normal"    Currently in Pain? No/denies               TREATMENT   Ther-ex Warm-up on NuStep L2 and L5/6 in 45sintervals with therapist adjusting resistanceto match intensityx58mnutes (1 minute warm-up and cool-down), brief history obtained; Updated outcome measure with patient: FOTO: 69 Percent improvement: 50% improvement Hours worked before fatigue: At least 2 hours  Total Gym (TG) L22 squats 2 x 20; TG L22 heel raises 2 x 20; TG L22 single leg squats x 20 RLE, attempted LLE but knee too painful so discontinued;  Green tband resisted side stepping with band around ankles in // bars x 6 lengths; Seated clams with green tband resistance 2 x 20; Seated adductor ball squeeze 2 x 20;  Forward BOSU (round side up) lunges without UE support; Forward BOSU (round side up) step-ups without UE support 2 x 10;  6" lateral heel taps x 20 BLE;  Pt educated throughout session about proper posture and technique with exercises. Improved exercise technique, movement at target joints, use of target muscles after min to mod verbal, visual, tactile cues.   Pt arrives with excellent motivation to participate in therapy. Updated outcome measures and goals with patient today. overall he reports approximately  50% improvement in his symptoms since first starting therapy. His FOTO improved from 60 at initial evaluation to 646today. He states that he can work for about 2 hours in the yard withotu any increase in his leg weakness. Continuedstrengthening with patient todayandintroduced the Total Gym into session. HEP updated to include additional strengthening exercises. He demonstrates good progress toward his goal of increased leg endurance. Pt encouraged to follow-up as scheduled.He will benefit from PT services to address deficits in lower extremity strength and endurancein order to return to full function at homeand when working out in the yard.                          PT Short Term Goals - 09/17/20 00488      PT SHORT TERM GOAL #1   Title Pt will be independent with HEP to improve leg strength in order to improve function at home and out in his yard.    Time 4    Period Weeks    Status Achieved    Target Date 09/08/20             PT Long Term Goals - 09/17/20 0805      PT LONG TERM GOAL #1   Title Pt will be able to work in the yard for at least 2 hours before his legs start to feel tired and fatigued so he can care for his property    Baseline 08/11/20: Legs start to feel week after working in the yard for around  1 hour, 09/17/20: Pt reports that he believes he could work for 2 hours out in the yard.    Time 8    Period Weeks    Status Achieved      PT LONG TERM GOAL #2   Title Pt will increase his FOTO score to at least 71 to demonstrate significant improvement in his function related to his leg weakness    Baseline 08/11/20: 60; 09/17/20: 69    Time 8    Period Weeks    Status Partially Met    Target Date 10/06/20      PT LONG TERM GOAL #3   Title Pt will report at least 75% improvement in his symptoms in order to be able to work in the yard longer without experiencing leg fatigue.    Baseline 09/17/20: 50% improvement    Time 8    Period Weeks    Status Partially Met    Target Date 10/06/20                 Plan - 09/17/20 0813    Clinical Impression Statement Pt arrives with excellent motivation to participate in therapy. Updated outcome measures and goals with patient today. overall he reports approximately  50% improvement in his symptoms since first starting therapy. His FOTO improved from 60 at initial evaluation to 80 today. He states that he can work for about 2 hours in the yard withotu any increase in his leg weakness. Continued strengthening with patient today and introduced the Total Gym into session. HEP updated to include additional strengthening exercises. He demonstrates good progress toward his goal of increased leg endurance. Pt encouraged to follow-up as  scheduled. He will benefit from PT services to address deficits in lower extremity strength and endurance in order to return to full function at home and when working out in the yard.    Personal Factors and Comorbidities Age;Comorbidity 3+;Fitness;Past/Current Experience;Time since onset of injury/illness/exacerbation    Comorbidities Stroke, HTN, hyperlipidemia, prostate and kidney cancer, CKD    Examination-Activity Limitations Bend;Carry;Stand;Transfers    Examination-Participation Restrictions Community Activity;Yard Work    Stability/Clinical Decision Making Unstable/Unpredictable    Rehab Potential Good    PT Frequency 2x / week    PT Duration 8 weeks    PT Treatment/Interventions ADLs/Self Care Home Management;Aquatic Therapy;Biofeedback;Canalith Repostioning;Cryotherapy;Electrical Stimulation;Iontophoresis 16m/ml Dexamethasone;Moist Heat;Traction;Ultrasound;DME Instruction;Gait training;Stair training;Functional mobility training;Therapeutic activities;Therapeutic exercise;Balance training;Neuromuscular re-education;Cognitive remediation;Patient/family education;Manual techniques;Passive range of motion;Dry needling;Vestibular;Spinal Manipulations;Joint Manipulations    PT Next Visit Plan Progress strengthening    PT Home Exercise Plan Access Code: [email or web address]    Consulted and Agree with Plan of Care Patient           Patient will benefit from skilled therapeutic intervention in order to improve the following deficits and impairments:  Decreased strength,Decreased endurance,Difficulty walking  Visit Diagnosis: Muscle weakness (generalized)  Difficulty in walking, not elsewhere classified     Problem List Patient Active Problem List   Diagnosis Date Noted  . Leg pain 06/22/2020  . Personal history of colonic polyps   . Polyp of descending colon   . CKD (chronic kidney disease) stage 3, GFR 30-59 ml/min (HCC) 12/28/2017  . Osteopenia 12/28/2017  . Polyp of gallbladder  12/28/2017  . Right bundle branch block 12/28/2017  . SAH ([place]arachnoid hemorrhage) ([place] 11/11[place]2016  . Essential hypertension 11/09/2016  . Mixed hyperlipidemia 11/08/2016  . Nontraumatic cortical hemorrhage of left cerebral hemisphere ([place] 11/08/2016  . Personal history of renal cell carcinoma 11/08/2016  .  Cerebrovascular accident ([place]) 10/18/2016  . History of kidney cancer 01/19/2007  . 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[place]) 01/12/2001   Phillips Grout PT, DPT, GCS  Hillari Zumwalt 09/17/2020, 9:37 AM  [facility] [address] [place], [place], [address] Phone: [phone]   Fax:  [phone]  Name: Chaunce Winkels MRN: [id number] Date of Birth: 1945/08/24

## 2020-09-22 ENCOUNTER — Ambulatory Visit: Payer: Medicare PPO

## 2020-09-22 ENCOUNTER — Other Ambulatory Visit: Payer: Self-pay

## 2020-09-22 DIAGNOSIS — R262 Difficulty in walking, not elsewhere classified: Secondary | ICD-10-CM | POA: Diagnosis not present

## 2020-09-22 DIAGNOSIS — M6281 Muscle weakness (generalized): Secondary | ICD-10-CM | POA: Diagnosis not present

## 2020-09-22 NOTE — Therapy (Signed)
[place] [facility] [address] [place], [place], [address] Phone: [phone]   Fax:  [phone]  Physical Therapy Treatment  Patient Details  Name: Jason Krause MRN: [id number] Date of Birth: [birth year] Referring Provider (PT): Dr. Girtha Hake   Encounter Date: 09/22/2020   PT End of Session - 09/22/20 0804    Visit Number 11    Number of Visits 17    Date for PT Re-Evaluation 10/06/20    Authorization Type eval: 08/11/20    PT Start Time 0800    PT Stop Time 0845    PT Time Calculation (min) 45 min    Activity Tolerance Patient tolerated treatment well    Behavior During Therapy [facility] for tasks assessed/performed           Past Medical History:  Diagnosis Date  . Cancer [facility])    kidney and prostate  . 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 Hyperlipidemia   . Hypertension   . Prostate cancer ([place])   . Stroke ([employer] [place])    slight weakness in right foot    Past Surgical History:  Procedure Laterality Date  . COLONOSCOPY WITH PROPOFOL N/A 12/06/2019   Procedure: COLONOSCOPY and biopsy WITH PROPOFOL;  Surgeon: Lucilla Lame, MD;  Location: [place];  Service: Endoscopy;  Laterality: N/A;  priority 4  . HERNIA REPAIR    . kidney removal due to cancer Right   . POLYPECTOMY N/A 12/06/2019   Procedure: POLYPECTOMY;  Surgeon: Lucilla Lame, MD;  Location: [place];  Service: Endoscopy;  Laterality: N/A;  . PROSTATE SURGERY     removed    There were no vitals filed for this visit.   Subjective Assessment - 09/22/20 0801    Subjective Pt reports that he is doing well today. He is not having any resting pain upon arrival. L groin has not been painful over the last couple days. No specific questions or concerns currently.    Pertinent History Pt referred by Dr. Alba Destine for bilateral leg weakness, chronic bilateral low back pain with bilateral sciatica, and foraminal stenosis of  lumbar region. During interview pt reports his legs feel heavy and weak when he gets tired. States that they won't "do what I tell them to do." He first started noticing symptoms within the last 2-3 years with gradual worsening. 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 No history of back trauma or surgery. He denies any pain in his low back or legs. He states that in the last couple weeks he has developed L anterior groin pain but does not relate it to his leg weakness. MD discontinued Simvastatin a couple months ago for concerns that it may be affecting his legs but pt has not noticed any improvement in his symptoms since stopping. He reports some "abnormal sensations in his bilateral anterior thighs" but is unable to describe the sensation further. He is able to walk/work in yard for about an hour before the symptoms start. Denies any numbness in legs or feet. If he stands too long he does notice that he starts leaning toward to the right side and his wife has told him multiple times that he is leaning to the right. Lumbar MRI from 07/03/20 showed minimal retrolisthesis of L2 on L3 and L3 on L4. 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 He states that he has had what he thinks is a NCV study but no results found in the chart upon review. He has an appointment to see Dr. Melrose Nakayama at [facility] neurology this Wednesday. 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 Remote R achilles tendon repair many years ago.    Diagnostic tests See history    Patient Stated Goals "I would like for my legs to feel normal"    Currently in Pain? No/denies             TREATMENT   Ther-ex  Total Gym (TG) L22 squats 2 x 20; TG L22 heel raises 2 x 20; TG L22 single leg squats 2 x 20 RLE, attempted LLE again but too painful; Sit to  standwith8#medicine ball overhead press from low mat table 2x 10; Resisted gait with Nautilus machine, 80#, forward, R lateral, L lateral, and backwards x 5 each direction; 6" alternating step-ups without UE support x 10 on each side; 6" alternating step-ups with Airex pad on top without UE support x 10 on each side; Green tband resisted side stepping with band around ankles in // bars x 6 lengths; Forward BOSU (round side up) lunges without UE support; Forward BOSU (round side up) step-ups without UE support 2 x 10;   Pt educated throughout session about proper posture and technique with exercises. Improved exercise technique, movement at target joints, use of target muscles after min to mod verbal, visual, tactile cues.    Pt arrives with excellent motivation to participate in therapy.Continuedstrengthening with patient today. Repeated resisted gait with Nautilus machine as well as step-ups on a variety of unstable surfaces. No HEP updates on this date. He demonstrates good progress toward his goal of increased leg endurance. Pt encouraged to follow-up as scheduled.He will benefit from PT services to address deficits in lower extremity strength and endurancein order to return to full function at homeand when working out in the yard.                             PT Short Term Goals - 09/17/20 3435      PT SHORT TERM GOAL #1   Title Pt will be independent with HEP to improve leg strength in order to improve function at home and out in his yard.    Time 4    Period Weeks    Status Achieved    Target Date 09/08/20             PT Long Term Goals - 09/17/20 0805      PT LONG TERM GOAL #1   Title Pt will be able to work in the yard for at least 2 hours before his legs start to feel tired and fatigued so he can care for his property    Baseline 08/11/20: Legs start to feel week after working in the yard for around 1 hour, 09/17/20: Pt reports that he  believes he could work for 2 hours out in the yard.    Time 8    Period Weeks    Status Achieved      PT LONG TERM GOAL #2   Title Pt will increase his FOTO score to at least 71 to demonstrate significant improvement in his function related to his leg weakness    Baseline 08/11/20: 60; 09/17/20: 69  Time 8    Period Weeks    Status Partially Met    Target Date 10/06/20      PT LONG TERM GOAL #3   Title Pt will report at least 75% improvement in his symptoms in order to be able to work in the yard longer without experiencing leg fatigue.    Baseline 09/17/20: 50% improvement    Time 8    Period Weeks    Status Partially Met    Target Date 10/06/20                 Plan - 09/22/20 0805    Clinical Impression Statement Pt arrives with excellent motivation to participate in therapy. Continued strengthening with patient today. Repeated resisted gait with Nautilus machine as well as step-ups on a variety of unstable surfaces. No HEP updates on this date. He demonstrates good progress toward his goal of increased leg endurance. Pt encouraged to follow-up as scheduled. He will benefit from PT services to address deficits in lower extremity strength and endurance in order to return to full function at home and when working out in the yard.    Personal Factors and Comorbidities Age;Comorbidity 3+;Fitness;Past/Current Experience;Time since onset of injury/illness/exacerbation    Comorbidities Stroke, HTN, hyperlipidemia, prostate and kidney cancer, CKD    Examination-Activity Limitations Bend;Carry;Stand;Transfers    Examination-Participation Restrictions Community Activity;Yard Work    Stability/Clinical Decision Making Unstable/Unpredictable    Rehab Potential Good    PT Frequency 2x / week    PT Duration 8 weeks    PT Treatment/Interventions ADLs/Self Care Home Management;Aquatic Therapy;Biofeedback;Canalith Repostioning;Cryotherapy;Electrical Stimulation;Iontophoresis 86m/ml  Dexamethasone;Moist Heat;Traction;Ultrasound;DME Instruction;Gait training;Stair training;Functional mobility training;Therapeutic activities;Therapeutic exercise;Balance training;Neuromuscular re-education;Cognitive remediation;Patient/family education;Manual techniques;Passive range of motion;Dry needling;Vestibular;Spinal Manipulations;Joint Manipulations    PT Next Visit Plan Progress strengthening    PT Home Exercise Plan Access Code: [email or web address]    Consulted and Agree with Plan of Care Patient           Patient will benefit from skilled therapeutic intervention in order to improve the following deficits and impairments:  Decreased strength,Decreased endurance,Difficulty walking  Visit Diagnosis: Muscle weakness (generalized)  Difficulty in walking, not elsewhere classified     Problem List Patient Active Problem List   Diagnosis Date Noted  . Leg pain 06/22/2020  . Personal history of colonic polyps   . Polyp of descending colon   . CKD (chronic kidney disease) stage 3, GFR 30-59 ml/min (HCC) 12/28/2017  . Osteopenia 12/28/2017  . Polyp of gallbladder 12/28/2017  . Right bundle branch block 12/28/2017  . SAH (subarachnoid hemorrhage) ([place] 11/11/2016  . Essential hypertension 11/09/2016  . Mixed hyperlipidemia 11/08/2016  . Nontraumatic cortical hemorrhage of left cerebral hemisphere ([place] 11/08/2016  . Personal history of renal cell carcinoma 11/08/2016  . Cerebrovascular accident ([place] 10/18/2016  . History of kidney cancer 01/19/2007  . 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[place] 01/12/2001   JPhillips GroutPT, DPT, GCS  Demeisha Geraghty 09/22/2020, 2:29 PM  [place] [facility] [place] [address]Phone: [phone]  Fax:  [phone] Name: Jason KlingerMRN: [id number] of Birth: 809/04/1945

## 2020-09-24 ENCOUNTER — Other Ambulatory Visit: Payer: Self-pay

## 2020-09-24 ENCOUNTER — Ambulatory Visit: Payer: Medicare PPO

## 2020-09-24 DIAGNOSIS — M6281 Muscle weakness (generalized): Secondary | ICD-10-CM | POA: Diagnosis not present

## 2020-09-24 DIAGNOSIS — R262 Difficulty in walking, not elsewhere classified: Secondary | ICD-10-CM | POA: Diagnosis not present

## 2020-09-24 NOTE — Therapy (Signed)
[facility] [address] [place], [place], [address] Phone: [phone]   Fax:  [phone]  Physical Therapy Treatment  Patient Details  Name: Jason Krause MRN: [id number] Date of Birth: 1946/02/06 Referring Provider (PT): Dr. Girtha Hake   Encounter Date: 09/24/2020   PT End of Session - 09/24/20 0759    Visit Number 12    Number of Visits 17    Date for PT Re-Evaluation 10/06/20    Authorization Type eval: 08/11/20    PT Start Time 0800    PT Stop Time 0845    PT Time Calculation (min) 45 min    Activity Tolerance Patient tolerated treatment well    Behavior During Therapy [facility] for tasks assessed/performed           Past Medical History:  Diagnosis Date  . Cancer [facility])    kidney and prostate  . 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 Hyperlipidemia   . Hypertension   . Prostate cancer ([place])   . Stroke ([place])    slight weakness in right foot    Past Surgical History:  Procedure Laterality Date  . COLONOSCOPY WITH PROPOFOL N/A 12/06/2019   Procedure: COLONOSCOPY and biopsy WITH PROPOFOL;  Surgeon: Lucilla Lame, MD;  Location: [place];  Service: Endoscopy;  Laterality: N/A;  priority 4  . HERNIA REPAIR    . kidney removal due to cancer Right   . POLYPECTOMY N/A 12/06/2019   Procedure: POLYPECTOMY;  Surgeon: Lucilla Lame, MD;  Location: [place];  Service: Endoscopy;  Laterality: N/A;  . PROSTATE SURGERY     removed    There were no vitals filed for this visit.   Subjective Assessment - 09/24/20 0759    Subjective Pt reports that he is doing well today. He is not having any resting pain upon arrival. L groin has not been painful over the last couple days. No specific questions or concerns currently.    Pertinent History Pt referred by Dr. Alba Destine for bilateral leg weakness, chronic bilateral low back pain with bilateral sciatica, and foraminal stenosis of  lumbar region. During interview pt reports his legs feel heavy and weak when he gets tired. States that they won't "do what I tell them to do." He first started noticing symptoms within the last 2-3 years with gradual worsening. 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 No history of back trauma or surgery. He denies any pain in his low back or legs. He states that in the last couple weeks he has developed L anterior groin pain but does not relate it to his leg weakness. MD discontinued Simvastatin a couple months ago for concerns that it may be affecting his legs but pt has not noticed any improvement in his symptoms since stopping. He reports some "abnormal sensations in his bilateral anterior thighs" but is unable to describe the sensation further. He is able to walk/work in yard for about an hour before the symptoms start. Denies any numbness in legs or feet. If he stands too long he does notice that he starts leaning toward to the right side and his wife has told him multiple times that he is leaning to the right. Lumbar MRI from 07/03/20 showed minimal retrolisthesis of L2 on L3 and L3 on L4. 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 He states that he has had what he thinks is a NCV study but no results found in the chart upon review. He has an appointment to see Dr. Melrose Nakayama at [facility] neurology this Wednesday. 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 Remote R achilles tendon repair many years ago.    Diagnostic tests See history    Patient Stated Goals "I would like for my legs to feel normal"    Currently in Pain? No/denies             TREATMENT   Ther-ex  NuStep intervals L2/6 45s each x 6 minutes with therapist adjusting resistance; TRX squats 2 x 10 with glut taps on chair alteranting  Sit  to standwith10#medicine ball overhead press from regular height chair2x 10; Resistedgait with Nautilus machine, 80#, forward, R lateral, L lateral, and backwards x 5 each direction; 6" alternating forward step-ups with UE support x 20 on each side; 6" alternating lateral step-ups with UE support x 10 to each direction; Forward BOSU (round side up) lunges without UE support x 10 on each side; Lateral BOSU (round side up) lunges without UE support x 10 on each side; Green tband resisted side stepping with band around ankles in // bars x 6 lengths;   Pt educated throughout session about proper posture and technique with exercises. Improved exercise technique, movement at target joints, use of target muscles after min to mod verbal, visual, tactile cues.    Pt arrives with excellent motivation to participate in therapy.Continuedstrengthening with patient today. Repeated resisted gait with Nautilus machine as well as step-ups. Introduced lateral BOSU lunges to challenge lateral hip stability. L knee continues to be intermittently painful with squats. No HEP updates on this date.He demonstrates good progress toward his goal of increased leg endurance. Pt encouraged to follow-up as scheduled.He will benefit from PT services to address deficits in lower extremity strength and endurancein order to return to full function at homeand when working out in the yard.                           PT Short Term Goals - 09/17/20 9379      PT SHORT TERM GOAL #1   Title Pt will be independent with HEP to improve leg strength in order to improve function at home and out in his yard.    Time 4    Period Weeks    Status Achieved    Target Date 09/08/20             PT Long Term Goals - 09/17/20 0805      PT LONG TERM GOAL #1   Title Pt will be able to work in the yard for at least 2 hours before his legs start to feel tired and fatigued so he can care for his property     Baseline 08/11/20: Legs start to feel week after working in the yard for around 1 hour, 09/17/20: Pt reports that he believes he could work for 2 hours out in the yard.    Time 8    Period Weeks    Status Achieved      PT LONG TERM GOAL #2   Title Pt will increase his FOTO score to at least 71 to demonstrate significant improvement in his function related to his leg weakness    Baseline 08/11/20: 60;  09/17/20: 69    Time 8    Period Weeks    Status Partially Met    Target Date 10/06/20      PT LONG TERM GOAL #3   Title Pt will report at least 75% improvement in his symptoms in order to be able to work in the yard longer without experiencing leg fatigue.    Baseline 09/17/20: 50% improvement    Time 8    Period Weeks    Status Partially Met    Target Date 10/06/20                 Plan - 09/24/20 0800    Clinical Impression Statement Pt arrives with excellent motivation to participate in therapy. Continued strengthening with patient today. Repeated resisted gait with Nautilus machine as well as step-ups. Introduced lateral BOSU lunges to challenge lateral hip stability. L knee continues to be intermittently painful with squats. No HEP updates on this date. He demonstrates good progress toward his goal of increased leg endurance. Pt encouraged to follow-up as scheduled. He will benefit from PT services to address deficits in lower extremity strength and endurance in order to return to full function at home and when working out in the yard.    Personal Factors and Comorbidities Age;Comorbidity 3+;Fitness;Past/Current Experience;Time since onset of injury/illness/exacerbation    Comorbidities Stroke, HTN, hyperlipidemia, prostate and kidney cancer, CKD    Examination-Activity Limitations Bend;Carry;Stand;Transfers    Examination-Participation Restrictions Community Activity;Yard Work    Stability/Clinical Decision Making Unstable/Unpredictable    Rehab Potential Good    PT Frequency 2x /  week    PT Duration 8 weeks    PT Treatment/Interventions ADLs/Self Care Home Management;Aquatic Therapy;Biofeedback;Canalith Repostioning;Cryotherapy;Electrical Stimulation;Iontophoresis 68m/ml Dexamethasone;Moist Heat;Traction;Ultrasound;DME Instruction;Gait training;Stair training;Functional mobility training;Therapeutic activities;Therapeutic exercise;Balance training;Neuromuscular re-education;Cognitive remediation;Patient/family education;Manual techniques;Passive range of motion;Dry needling;Vestibular;Spinal Manipulations;Joint Manipulations    PT Next Visit Plan Progress strengthening    PT Home Exercise Plan Access Code: [email or web address]    Consulted and Agree with Plan of Care Patient           Patient will benefit from skilled therapeutic intervention in order to improve the following deficits and impairments:  Decreased strength,Decreased endurance,Difficulty walking  Visit Diagnosis: Muscle weakness (generalized)  Difficulty in walking, not elsewhere classified     Problem List Patient Active Problem List   Diagnosis Date Noted  . Leg pain 06/22/2020  . Personal history of colonic polyps   . Polyp of descending colon   . CKD (chronic kidney disease) stage 3, GFR 30-59 ml/min (HCC) 12/28/2017  . Osteopenia 12/28/2017  . Polyp of gallbladder 12/28/2017  . Right bundle branch block 12/28/2017  . SAH ([place]noid [place]) ([place] 11/11[place]2016  . Essential hypertension 11/09/2016  . Mixed hyperlipidemia 11/08/2016  . Nontraumatic cortical hemorrhage of left cerebral hemisphere ([place] 11/08/2016  . Personal history of renal cell carcinoma 11/08/2016  . Cerebrovascular accident ([place] 10/18/2016  . History of kidney cancer 01/19/2007  . 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[place] 01/12/2001   JPhillips GroutPT, DPT, GCS  Jason Krause 09/24/2020, 9:01 PM   [facility] [place] [address]Phone: [phone]  Fax:   [phone] Name: Jason RelphMRN: [id number] of Birth: [birth year]

## 2020-09-29 ENCOUNTER — Ambulatory Visit: Payer: Medicare PPO

## 2020-09-29 ENCOUNTER — Other Ambulatory Visit: Payer: Self-pay

## 2020-09-29 DIAGNOSIS — M6281 Muscle weakness (generalized): Secondary | ICD-10-CM | POA: Diagnosis not present

## 2020-09-29 DIAGNOSIS — R262 Difficulty in walking, not elsewhere classified: Secondary | ICD-10-CM | POA: Diagnosis not present

## 2020-09-29 NOTE — Therapy (Signed)
[facility] [address] [place], [place], [address] Phone: [phone]   Fax:  [phone]  Physical Therapy Treatment  Patient Details  Name: Jason Krause MRN: [id number] Date of Birth: 16-Sep-1945 Referring Provider (PT): Dr. 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   Hyperlipidemia    Hypertension    Prostate cancer ([place])    Stroke ([place])    slight weakness in right foot    Past Surgical History:  Procedure Laterality Date   COLONOSCOPY WITH PROPOFOL N/A 12/06/2019   Procedure: COLONOSCOPY and biopsy WITH PROPOFOL;  Surgeon: Lucilla Lame, MD;  Location: [place];  Service: Endoscopy;  Laterality: N/A;  priority 4   HERNIA REPAIR     kidney removal due to cancer Right    POLYPECTOMY N/A 12/06/2019   Procedure: POLYPECTOMY;  Surgeon: Lucilla Lame, MD;  Location: [place];  Service: Endoscopy;  Laterality: N/A;   PROSTATE SURGERY     removed    There were no vitals filed for this visit.   Subjective Assessment - 09/29/20 0759     Subjective Pt reports that he is doing well today. He is not having any resting pain upon arrival. L groin has not been painful over the last couple days. No specific questions or concerns currently.    Pertinent History Pt referred by Dr. Alba Destine for bilateral leg weakness, chronic bilateral low back pain with  bilateral sciatica, and foraminal stenosis of lumbar region. During interview pt reports his legs feel heavy and weak when he gets tired. States that they won't "do what I tell them to do." He first started noticing symptoms within the last 2-3 years with gradual worsening. 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 No history of back trauma or surgery. He denies any pain in his low back or legs. He states that in the last couple weeks he has developed L anterior groin pain but does not relate it to his leg weakness. MD discontinued Simvastatin a couple months ago for concerns that it may be affecting his legs but pt has not noticed any improvement in his symptoms since stopping. He reports some "abnormal sensations in his bilateral anterior thighs" but is unable to describe the sensation further. He is able to walk/work in yard for about an hour before the symptoms start. Denies any numbness in legs or feet. If he stands too long he does notice that he starts leaning toward to the right side and his wife has told him multiple times that he is leaning to the right. Lumbar MRI from 07/03/20 showed minimal retrolisthesis of L2 on L3 and L3 on L4. Lumbar  spine spondylosis most notable from L2 through L5 with moderate neural foraminal narrowing and mild central canal stenosis. He states that he has had what he thinks is a NCV study but no results found in the chart upon review. He has an appointment to see Dr. Melrose Nakayama at [facility] neurology this Wednesday. 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 Remote R achilles tendon repair many years ago.    Diagnostic tests See history    Patient Stated Goals "I would like for my legs to feel normal"    Currently in Pain? No/denies                  TREATMENT     Ther-ex  NuStep L2-6 x 6 minutes with therapist adjusting resistance; Squats 1 x 10 holding  bar with glut taps on chair Sit to stand with 10# medicine ball overhead press from regular height chair 2 x 10; Forward step-ups on BOSU (round side up), no UE support x 10 on each side; Forward BOSU (round side up) lunges without UE support x 10 on each side; Hip flexion marching, hip abduction, hip extension and hamstring curl with 4# ankle weights; 2 x 1 min each Heel raises x20 reps LAQ alternating with 4# ankle weights; 2 x 1 min Seated bilateral hip abduction using green theraband; 2 x 1 min Seated hip adduction, ball squeeze; 2 x 1 min Tandem stance on half foam roller (flat side up); 2 x 1 min Static standing on half foam roller (flat side up); 30 sec   Pt educated throughout session about proper posture and technique with exercises. Improved exercise technique, movement at target joints, use of target muscles after min to mod verbal, visual, tactile cues.      Pt arrives with excellent motivation to participate in therapy. Continued strengthening with patient today. Progressed step-ups from solid surface to BOSU. No pain experienced in L knee with therex. No HEP updates on this date. He demonstrates good progress toward his goal of increased leg endurance. Pt encouraged to follow-up as scheduled. He will benefit from PT services to address deficits in lower extremity strength and endurance in order to return to full function at home and when working out in the yard.                        PT Short Term Goals - 09/17/20 6546       PT SHORT TERM GOAL #1   Title Pt will be independent with HEP to improve leg strength in order to improve function at home and out in his yard.    Time 4    Period Weeks    Status Achieved    Target Date 09/08/20               PT Long Term Goals - 09/17/20 0805       PT LONG TERM GOAL #1   Title Pt will be able to work in the yard for at least 2 hours before his legs start to feel tired and fatigued so he can care for his  property    Baseline 08/11/20: Legs start to feel week after working in the yard for around 1 hour, 09/17/20: Pt reports that he believes he could work for 2 hours out in the yard.    Time 8    Period Weeks    Status Achieved  PT LONG TERM GOAL #2   Title Pt will increase his FOTO score to at least 71 to demonstrate significant improvement in his function related to his leg weakness    Baseline 08/11/20: 60; 09/17/20: 69    Time 8    Period Weeks    Status Partially Met    Target Date 10/06/20      PT LONG TERM GOAL #3   Title Pt will report at least 75% improvement in his symptoms in order to be able to work in the yard longer without experiencing leg fatigue.    Baseline 09/17/20: 50% improvement    Time 8    Period Weeks    Status Partially Met    Target Date 10/06/20                   Plan - 09/29/20 0827     Clinical Impression Statement Pt arrives with excellent motivation to participate in therapy. Continued strengthening with patient today. Progressed step-ups from solid surface to BOSU. No pain experienced in L knee with therex. No HEP updates on this date. He demonstrates good progress toward his goal of increased leg endurance. Pt encouraged to follow-up as scheduled. He will benefit from PT services to address deficits in lower extremity strength and endurance in order to return to full function at home and when working out in the yard.    Personal Factors and Comorbidities Age;Comorbidity 3+;Fitness;Past/Current Experience;Time since onset of injury/illness/exacerbation    Comorbidities Stroke, HTN, hyperlipidemia, prostate and kidney cancer, CKD    Examination-Activity Limitations Bend;Carry;Stand;Transfers    Examination-Participation Restrictions Community Activity;Yard Work    Stability/Clinical Decision Making Unstable/Unpredictable    Rehab Potential Good    PT Frequency 2x / week    PT Duration 8 weeks    PT Treatment/Interventions ADLs/Self Care Home  Management;Aquatic Therapy;Biofeedback;Canalith Repostioning;Cryotherapy;Electrical Stimulation;Iontophoresis 62m/ml Dexamethasone;Moist Heat;Traction;Ultrasound;DME Instruction;Gait training;Stair training;Functional mobility training;Therapeutic activities;Therapeutic exercise;Balance training;Neuromuscular re-education;Cognitive remediation;Patient/family education;Manual techniques;Passive range of motion;Dry needling;Vestibular;Spinal Manipulations;Joint Manipulations    PT Next Visit Plan Progress strengthening    PT Home Exercise Plan Access Code: [email or web address]    Consulted and Agree with Plan of Care Patient             Patient will benefit from skilled therapeutic intervention in order to improve the following deficits and impairments:  Decreased strength, Decreased endurance, Difficulty walking  Visit Diagnosis: Muscle weakness (generalized)  Difficulty in walking, not elsewhere classified     Problem List Patient Active Problem List   Diagnosis Date Noted   Leg pain 06/22/2020   Personal history of colonic polyps    Polyp of descending colon    CKD (chronic kidney disease) stage 3, GFR 30-59 ml/min ([place] 12/28/2017   Osteopenia 12/28/2017   Polyp of gallbladder 12/28/2017   Right bundle branch block 12/28/2017   SAH (subarachnoid hemorrhage) ([place] 11/11/2016   Essential hypertension 11/09/2016   Mixed hyperlipidemia 11/08/2016   Nontraumatic cortical hemorrhage of left cerebral hemisphere ([place] 11/08/2016   Personal history of renal cell carcinoma 11/08/2016   Cerebrovascular [place] ([place] 10/18/2016   History of kidney cancer 01/19/2007   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[place] 01/12/2001   JLyndel SafeHuprich PT, DPT, GCS  Venita Seng 09/29/2020, 8:45 AM  [place] [facility] [place] [address]Phone: [phone]  Fax:  [phone] Name: LKeyion KnackMRN: [id number] of Birth: [birth year]

## 2020-10-01 ENCOUNTER — Other Ambulatory Visit: Payer: Self-pay

## 2020-10-01 ENCOUNTER — Ambulatory Visit: Payer: Medicare PPO

## 2020-10-01 DIAGNOSIS — R262 Difficulty in walking, not elsewhere classified: Secondary | ICD-10-CM | POA: Diagnosis not present

## 2020-10-01 DIAGNOSIS — M6281 Muscle weakness (generalized): Secondary | ICD-10-CM | POA: Diagnosis not present

## 2020-10-01 NOTE — Therapy (Signed)
[facility] [address] [place], [place], [address] Phone: [phone]   Fax:  [phone]  Physical Therapy Treatment  Patient Details  Name: Jason Krause MRN: [id number] Date of Birth: 01/15/1946 Referring Provider (PT): Dr. 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   Hyperlipidemia    Hypertension    Prostate cancer ([place])    Stroke ([place])    slight weakness in right foot    Past Surgical History:  Procedure Laterality Date   COLONOSCOPY WITH PROPOFOL N/A 12/06/2019   Procedure: COLONOSCOPY and biopsy WITH PROPOFOL;  Surgeon: Lucilla Lame, MD;  Location: [place];  Service: Endoscopy;  Laterality: N/A;  priority 4   HERNIA REPAIR     kidney removal due to cancer Right    POLYPECTOMY N/A 12/06/2019   Procedure: POLYPECTOMY;  Surgeon: Lucilla Lame, MD;  Location: [place];  Service: Endoscopy;  Laterality: N/A;   PROSTATE SURGERY     removed    There were no vitals filed for this visit.   Subjective Assessment - 10/01/20 1105     Subjective Pt reports that he is doing well today. He is not having any resting pain upon arrival. Denies left groin pain. No specific questions or concerns currently.    Pertinent History Pt referred by Dr. Alba Destine for bilateral leg weakness, chronic bilateral low back pain with bilateral sciatica, and foraminal  stenosis of lumbar region. During interview pt reports his legs feel heavy and weak when he gets tired. States that they won't "do what I tell them to do." He first started noticing symptoms within the last 2-3 years with gradual worsening. 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 No history of back trauma or surgery. He denies any pain in his low back or legs. He states that in the last couple weeks he has developed L anterior groin pain but does not relate it to his leg weakness. MD discontinued Simvastatin a couple months ago for concerns that it may be affecting his legs but pt has not noticed any improvement in his symptoms since stopping. He reports some "abnormal sensations in his bilateral anterior thighs" but is unable to describe the sensation further. He is able to walk/work in yard for about an hour before the symptoms start. Denies any numbness in legs or feet. If he stands too long he does notice that he starts leaning toward to the right side and his wife has told him multiple times that he is leaning to the right. Lumbar MRI from 07/03/20 showed minimal retrolisthesis of L2 on L3 and L3 on L4. Lumbar spine spondylosis most notable from L2 through  L5 with moderate neural foraminal narrowing and mild central canal stenosis. He states that he has had what he thinks is a NCV study but no results found in the chart upon review. He has an appointment to see Dr. Melrose Nakayama at [facility] neurology this Wednesday. 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 Remote R achilles tendon repair many years ago.    Diagnostic tests See history    Patient Stated Goals "I would like for my legs to feel normal"    Currently in Pain? No/denies               TREATMENT     Ther-ex  NuStep L3-5 x 5 minutes with therapist adjusting resistance; Squats 1 x 10 with glute taps on chair; Squats with 10#  medicine ball and glute taps to chair 2 x 10; Forward step-ups on BOSU (round side up), no UE support x 10 on each side; Standing balance on BOSU (round side up) 2 x 45s; Hip flexion marching, hip abduction, hip extension and hamstring curl with 5# ankle weights; 2 x 1 min each; Heel raises 2 x 20 reps; Side steps with black RB 12 x 3 each side; Standing on Airex with alternating toe taps to cone 2 x 31mn;    Pt educated throughout session about proper posture and technique with exercises. Improved exercise technique, movement at target joints, use of target muscles after min to mod verbal, visual, tactile cues.      Pt arrives with excellent motivation to participate in therapy. Continued strengthening with patient today. Progressed strength to 5# AW. Added standing balance on BOSU ball. Pt required few seated rest breaks; most initiated by therapist rather than pt. No pain experienced in L knee with therex. No HEP updates on this date. He demonstrates good progress toward his goal of increased leg endurance. Pt encouraged to follow-up as scheduled. He will benefit from PT services to address deficits in lower extremity strength and endurance in order to return to full function at home and when working out in the yard.           PT Short Term Goals - 09/17/20 03818      PT SHORT TERM GOAL #1   Title Pt will be independent with HEP to improve leg strength in order to improve function at home and out in his yard.    Time 4    Period Weeks    Status Achieved    Target Date 09/08/20               PT Long Term Goals - 09/17/20 0805       PT LONG TERM GOAL #1   Title Pt will be able to work in the yard for at least 2 hours before his legs start to feel tired and fatigued so he can care for his property    Baseline 08/11/20: Legs start to feel week after working in the yard for around 1 hour, 09/17/20: Pt reports that he believes he could work for 2 hours out in the yard.    Time 8     Period Weeks    Status Achieved      PT LONG TERM GOAL #2   Title Pt will increase his FOTO score to at least 71 to demonstrate significant improvement in his function related to his leg weakness    Baseline 08/11/20: 60; 09/17/20: 69  Time 8    Period Weeks    Status Partially Met    Target Date 10/06/20      PT LONG TERM GOAL #3   Title Pt will report at least 75% improvement in his symptoms in order to be able to work in the yard longer without experiencing leg fatigue.    Baseline 09/17/20: 50% improvement    Time 8    Period Weeks    Status Partially Met    Target Date 10/06/20                   Plan - 10/01/20 1132     Clinical Impression Statement Pt arrives with excellent motivation to participate in therapy. Continued strengthening with patient today. Progressed strength to 5# AW. Added standing balance on BOSU ball. Pt required few seated rest breaks; most initiated by therapist rather than pt. No pain experienced in L knee with therex. No HEP updates on this date. He demonstrates good progress toward his goal of increased leg endurance. Pt encouraged to follow-up as scheduled. He will benefit from PT services to address deficits in lower extremity strength and endurance in order to return to full function at home and when working out in the yard.    Personal Factors and Comorbidities Age;Comorbidity 3+;Fitness;Past/Current Experience;Time since onset of injury/illness/exacerbation    Comorbidities Stroke, HTN, hyperlipidemia, prostate and kidney cancer, CKD    Examination-Activity Limitations Bend;Carry;Stand;Transfers    Examination-Participation Restrictions Community Activity;Yard Work    Stability/Clinical Decision Making Unstable/Unpredictable    Rehab Potential Good    PT Frequency 2x / week    PT Duration 8 weeks    PT Treatment/Interventions ADLs/Self Care Home Management;Aquatic Therapy;Biofeedback;Canalith Repostioning;Cryotherapy;Electrical  Stimulation;Iontophoresis 4m/ml Dexamethasone;Moist Heat;Traction;Ultrasound;DME Instruction;Gait training;Stair training;Functional mobility training;Therapeutic activities;Therapeutic exercise;Balance training;Neuromuscular re-education;Cognitive remediation;Patient/family education;Manual techniques;Passive range of motion;Dry needling;Vestibular;Spinal Manipulations;Joint Manipulations    PT Next Visit Plan Progress strengthening    PT Home Exercise Plan Access Code: [email or web address]    Consulted and Agree with Plan of Care Patient             Patient will benefit from skilled therapeutic intervention in order to improve the following deficits and impairments:  Decreased strength, Decreased endurance, Difficulty walking  Visit Diagnosis: Muscle weakness (generalized)  Difficulty in walking, not elsewhere classified     Problem List Patient Active Problem List   Diagnosis Date Noted   Leg pain 06/22/2020   Personal history of colonic polyps    Polyp of descending colon    CKD (chronic kidney disease) stage 3, GFR 30-59 ml/min ([place] 12/28/2017   Osteopenia 12/28/2017   Polyp of gallbladder 12/28/2017   Right bundle branch block 12/28/2017   SAH (subarachnoid hemorrhage) ([place] 11/11/2016   Essential hypertension 11/09/2016   Mixed hyperlipidemia 11/08/2016   Nontraumatic cortical hemorrhage of left cerebral hemisphere ([place] 11/08/2016   Personal history of renal cell carcinoma 11/08/2016   Cerebrovascular accident ([place] 10/18/2016   History of kidney cancer 01/19/2007   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[place] 01/12/2001   KPatrina LeveringPT, DPT  KRamon[facility] Lab6/15/2022, 1:18 PM  [place] [facility] [place] [address]Phone: [phone]  Fax:  [phone] Name: LJyren CerasoliMRN: [id number] of Birth: [birth year]

## 2020-10-06 ENCOUNTER — Ambulatory Visit: Payer: Medicare PPO

## 2020-10-06 ENCOUNTER — Other Ambulatory Visit: Payer: Self-pay

## 2020-10-06 DIAGNOSIS — M6281 Muscle weakness (generalized): Secondary | ICD-10-CM | POA: Diagnosis not present

## 2020-10-06 DIAGNOSIS — R262 Difficulty in walking, not elsewhere classified: Secondary | ICD-10-CM

## 2020-10-06 NOTE — Therapy (Signed)
[facility] [address] [place], [place], [address] Phone: [phone]   Fax:  [phone]  Physical Therapy Treatment  Patient Details  Name: Jason Krause MRN: [id number] Date of Birth: [birth year] Referring Provider (PT): Dr. Girtha Hake   Encounter Date: 10/06/2020   PT End of Session - 10/06/20 0810     Visit Number 15    Number of Visits 17    Date for PT Re-Evaluation 10/06/20    Authorization Type eval: 08/11/20    PT Start Time 0803    PT Stop Time 0845    PT Time Calculation (min) 42 min    Equipment Utilized During Treatment Gait belt    Activity Tolerance Patient tolerated treatment well    Behavior During Therapy [facility] for tasks assessed/performed             Past Medical History:  Diagnosis Date   Cancer ([place])    kidney and prostate   Chronic kidney disease    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   Hyperlipidemia    Hypertension    Prostate cancer ([place])    Stroke ([place])    slight weakness in right foot    Past Surgical History:  Procedure Laterality Date   COLONOSCOPY WITH PROPOFOL N/A 12/06/2019   Procedure: COLONOSCOPY and biopsy WITH PROPOFOL;  Surgeon: Lucilla Lame, MD;  Location: [place];  Service: Endoscopy;  Laterality: N/A;  priority 4   HERNIA REPAIR     kidney removal due to cancer Right    POLYPECTOMY N/A 12/06/2019   Procedure: POLYPECTOMY;  Surgeon: Lucilla Lame, MD;  Location: [place];  Service: Endoscopy;  Laterality: N/A;   PROSTATE SURGERY     removed    There were no vitals filed for this visit.   Subjective Assessment - 10/06/20 0807     Subjective Pt reports that he is doing okay today. Denies any pain since his last therapy visit. Pt reports being compliant with HEP. No specific questions or concerns currently.    Pertinent History Pt referred by Dr. Alba Destine for bilateral leg weakness, chronic bilateral low back pain with bilateral sciatica, and  foraminal stenosis of lumbar region. During interview pt reports his legs feel heavy and weak when he gets tired. States that they won't "do what I tell them to do." He first started noticing symptoms within the last 2-3 years with gradual worsening. 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 No history of back trauma or surgery. He denies any pain in his low back or legs. He states that in the last couple weeks he has developed L anterior groin pain but does not relate it to his leg weakness. MD discontinued Simvastatin a couple months ago for concerns that it may be affecting his legs but pt has not noticed any improvement in his symptoms since stopping. He reports some "abnormal sensations in his bilateral anterior thighs" but is unable to describe the sensation further. He is able to walk/work in yard for about an hour before the symptoms start. Denies any numbness in legs or feet. If he stands too long he does notice that he starts leaning toward to the right side and his wife has told him multiple times that he is leaning to the right. Lumbar MRI from 07/03/20 showed minimal retrolisthesis of L2 on L3 and L3 on L4. 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 He states that he has had what he thinks is a NCV study but no results found in the chart upon review. He has an appointment to see Dr. Melrose Nakayama at [facility] neurology this Wednesday. 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 Remote R achilles tendon repair many years ago.    Diagnostic tests See history    Patient Stated Goals "I would like for my legs to feel normal"    Currently in Pain? No/denies              TREATMENT     Ther-ex  NuStep L3-5 x 5 minutes with therapist adjusting resistance; Squats 1 x 10 with glute taps on chair; Squats with 10#  medicine ball and glute taps to chair 2 x 10; Forward step-ups 6" 2 x 10 each side; Forward step-ups on BOSU (round side up), no UE support 2 x 45s on each side; Standing balance on BOSU (round side up) 2 x 45s; Hip flexion marching, hip abduction, hip extension and hamstring curl with 5# ankle weights; 2 x 1 min each; Heel raises 2 x 20 reps; Side steps with black RB 12 x 3 each side; Standing on Airex with alternating toe taps to cone 2 x 90mn;     Pt educated throughout session about proper posture and technique with exercises. Improved exercise technique, movement at target joints, use of target muscles after min to mod verbal, visual, tactile cues.      Pt arrives with excellent motivation to participate in therapy. Continued strengthening with patient today. Progressed training with increased reps. Standing balance has improved with decreased LOB on BOSU ball. Pt took 2 seated rest breaks; both initiated by therapist rather than pt. No pain experienced in L knee with therex. No HEP updates on this date. He demonstrates good progress toward his goal of increased leg endurance.  Pt encouraged to follow-up as scheduled. He will benefit from PT services to address deficits in lower extremity strength and endurance in order to return to full function at home and when working out in the yard.             PT Short Term Goals - 09/17/20 03762      PT SHORT TERM GOAL #1   Title Pt will be independent with HEP to improve leg strength in order to improve function at home and out in his yard.    Time 4    Period Weeks    Status Achieved    Target Date 09/08/20               PT Long Term Goals - 09/17/20 0805       PT LONG TERM GOAL #1   Title Pt will be able to work in the yard for at least 2 hours before his legs start to feel tired and fatigued so he can care for his property    Baseline 08/11/20: Legs start to feel week after working in the yard for around 1 hour, 09/17/20: Pt  reports that he believes he could work for 2 hours out in the yard.    Time 8    Period Weeks    Status Achieved      PT LONG TERM GOAL #2   Title Pt will increase his FOTO score to at least 71 to demonstrate significant improvement in  his function related to his leg weakness    Baseline 08/11/20: 60; 09/17/20: 69    Time 8    Period Weeks    Status Partially Met    Target Date 10/06/20      PT LONG TERM GOAL #3   Title Pt will report at least 75% improvement in his symptoms in order to be able to work in the yard longer without experiencing leg fatigue.    Baseline 09/17/20: 50% improvement    Time 8    Period Weeks    Status Partially Met    Target Date 10/06/20                   Plan - 10/06/20 0835     Clinical Impression Statement Pt arrives with excellent motivation to participate in therapy. Continued strengthening with patient today. Progressed training with increased reps. Standing balance has improved with decreased LOB on BOSU ball. Pt took 2 seated rest breaks; both initiated by therapist rather than pt. No pain experienced in L knee with therex. No HEP updates on this date. He demonstrates good progress toward his goal of increased leg endurance. Pt encouraged to follow-up as scheduled. He will benefit from PT services to address deficits in lower extremity strength and endurance in order to return to full function at home and when working out in the yard.    Personal Factors and Comorbidities Age;Comorbidity 3+;Fitness;Past/Current Experience;Time since onset of injury/illness/exacerbation    Comorbidities Stroke, HTN, hyperlipidemia, prostate and kidney cancer, CKD    Examination-Activity Limitations Bend;Carry;Stand;Transfers    Examination-Participation Restrictions Community Activity;Yard Work    Stability/Clinical Decision Making Unstable/Unpredictable    Rehab Potential Good    PT Frequency 2x / week    PT Duration 8 weeks    PT Treatment/Interventions  ADLs/Self Care Home Management;Aquatic Therapy;Biofeedback;Canalith Repostioning;Cryotherapy;Electrical Stimulation;Iontophoresis 39m/ml Dexamethasone;Moist Heat;Traction;Ultrasound;DME Instruction;Gait training;Stair training;Functional mobility training;Therapeutic activities;Therapeutic exercise;Balance training;Neuromuscular re-education;Cognitive remediation;Patient/family education;Manual techniques;Passive range of motion;Dry needling;Vestibular;Spinal Manipulations;Joint Manipulations    PT Next Visit Plan Progress strengthening    PT Home Exercise Plan Access Code: [email or web address]    Consulted and Agree with Plan of Care Patient             Patient will benefit from skilled therapeutic intervention in order to improve the following deficits and impairments:  Decreased strength, Decreased endurance, Difficulty walking  Visit Diagnosis: Muscle weakness (generalized)  Difficulty in walking, not elsewhere classified     Problem List Patient Active Problem List   Diagnosis Date Noted   Leg pain 06/22/2020   Personal history of colonic polyps    Polyp of descending colon    CKD (chronic kidney disease) stage 3, GFR 30-59 ml/min ([place] 12/28/2017   Osteopenia 12/28/2017   Polyp of gallbladder 12/28/2017   Right bundle branch block 12/28/2017   SAH (subarachnoid hemorrhage) ([place] 11/11/2016   Essential hypertension 11/09/2016   Mixed hyperlipidemia 11/08/2016   Nontraumatic cortical hemorrhage of left cerebral hemisphere ([place] 11/08/2016   Personal history of renal cell carcinoma 11/08/2016   Cerebrovascular accident ([place] 10/18/2016   History of kidney cancer 01/19/2007   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[place] 01/12/2001   KPatrina LeveringPT, DPT  [facility]6/20/2022, 9:49 AM  [place] [facility] [place] [address]Phone: [phone]  Fax:  [phone] Name: Jason StupkaMRN: [id number] of Birth: 807/17/1947

## 2020-10-07 NOTE — Patient Instructions (Addendum)
Access Code: [facility] URL: [email or web address] Date: 10/08/2020 Prepared by: Roxana Hires  Exercises Mini Squat with Counter Support - 1 x daily - 7 x weekly - 2 sets - 10 reps Heel rises with counter support - 1 x daily - 7 x weekly - 2 sets - 20 reps - 3s hold Standing Hip Abduction with Resistance at Ankles and Counter Support - 1 x daily - 7 x weekly - 2 sets - 10 reps - 3s hold Standing Hip Extension with Resistance at Ankles and Unilateral Counter Support - 1 x daily - 7 x weekly - 2 sets - 10 reps - 3s hold Sit to Stand Without Arm Support - 1 x daily - 7 x weekly - 2 sets - 10 reps Seated Ankle Dorsiflexion with Resistance - 1 x daily - 7 x weekly - 2 sets - 10 reps - 3s hold Hooklying Single Knee to Chest Stretch - 1 x daily - 7 x weekly - 3 reps - 30s (both hips) hold Supine Piriformis Stretch with Foot on Ground - 1 x daily - 7 x weekly - 3 reps - 30s (both hips) hold Supine Figure 4 Piriformis Stretch - 1 x daily - 7 x weekly - 3 reps - 30s (both hips) hold

## 2020-10-08 ENCOUNTER — Ambulatory Visit: Payer: Medicare PPO

## 2020-10-08 ENCOUNTER — Other Ambulatory Visit: Payer: Self-pay

## 2020-10-08 DIAGNOSIS — R262 Difficulty in walking, not elsewhere classified: Secondary | ICD-10-CM | POA: Diagnosis not present

## 2020-10-08 DIAGNOSIS — M6281 Muscle weakness (generalized): Secondary | ICD-10-CM

## 2020-10-08 NOTE — Therapy (Signed)
[facility] [address]. [place], [place], [address] Phone: [phone]   Fax:  [phone]  Physical Therapy Treatment/Discharge  Patient Details  Name: Jason Krause MRN: [id number] Date of Birth: 05-04-1945 Referring Provider (PT): Dr. 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   Hyperlipidemia    Hypertension    Prostate cancer ([place])    Stroke ([place])    slight weakness in right foot    Past Surgical History:  Procedure Laterality Date   COLONOSCOPY WITH PROPOFOL N/A 12/06/2019   Procedure: COLONOSCOPY and biopsy WITH PROPOFOL;  Surgeon: Lucilla Lame, MD;  Location: [place];  Service: Endoscopy;  Laterality: N/A;  priority 4   HERNIA REPAIR     kidney removal due to cancer Right    POLYPECTOMY N/A 12/06/2019   Procedure: POLYPECTOMY;  Surgeon: Lucilla Lame, MD;  Location: [place];  Service: Endoscopy;  Laterality: N/A;   PROSTATE SURGERY     removed    There were no vitals filed for this visit.   Subjective Assessment - 10/08/20 0800     Subjective Pt reports that he is doing okay today. Denies any pain since his last therapy visit. Pt reports being compliant with HEP. No specific questions or concerns currently.    Pertinent History Pt referred by Dr. Alba Destine for bilateral leg weakness, chronic bilateral low back pain with bilateral  sciatica, and foraminal stenosis of lumbar region. During interview pt reports his legs feel heavy and weak when he gets tired. States that they won't "do what I tell them to do." He first started noticing symptoms within the last 2-3 years with gradual worsening. 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 No history of back trauma or surgery. He denies any pain in his low back or legs. He states that in the last couple weeks he has developed L anterior groin pain but does not relate it to his leg weakness. MD discontinued Simvastatin a couple months ago for concerns that it may be affecting his legs but pt has not noticed any improvement in his symptoms since stopping. He reports some "abnormal sensations in his bilateral anterior thighs" but is unable to describe the sensation further. He is able to walk/work in yard for about an hour before the symptoms start. Denies any numbness in legs or feet. If he stands too long he does notice that he starts leaning toward to the right side and his wife has told him multiple times that he is leaning to the right. Lumbar MRI from 07/03/20 showed minimal retrolisthesis of L2 on L3 and L3 on L4. 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 He states that he has had what he thinks is a NCV study but no results found in the chart upon review. He has an appointment to see Dr. Melrose Nakayama at [facility] neurology this Wednesday. 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 Remote R achilles tendon repair many years ago.    Diagnostic tests See history    Patient Stated Goals "I would like for my legs to feel normal"    Currently in Pain? No/denies                  TREATMENT     Ther-ex  Updated goals with patient: FOTO: 81 Percent improvement: 75% better Hours worked in yard: At  least 2 hours before onset of leg fatigue;  NuStep L2-3 x 5 minutes with therapist adjusting resistance; Total Gym (TG) L22 squats 2 x 20; TG L22 heel raises 2 x 20; BOSU (round side up) lunges x 10 BLE; BOSU (round side up) forward step-ups without UE support x 10 BLE; Squats with 10# medicine ball and glute taps to chair 2 x 10;  Hip flexion marching, hip abduction, hip extension and hamstring curl with 5# ankle weights x 1 min each; Seated alternating LAQ with 5# ankle weights x 1 minute;  Side steps in // bars with blue tband 2 x 6 lengths; Updated and reviewed HEP;     Pt educated throughout session about proper posture and technique with exercises. Improved exercise technique, movement at target joints, use of target muscles after min to mod verbal, visual, tactile cues.      Pt arrives with excellent motivation to participate in therapy. Updated outcome measures and goals with patient today. Overall he reports approximately 75% improvement in his symptoms since first starting therapy. The only limitation currently is his L knee pain which was not the reason for his referral to therapy. His FOTO score improved from 60 at initial evaluation to 81 today. He states that he can work for at least 2 hours in the yard without any increase in his leg weakness/fatigue. Continued strengthening with patient today and he is able to complete all exercises without reported increase in his leg fatigue/weakness. HEP reviewed with patient today.  Pt will be discharged today having met all of his goals. He agreed to continue his HEP independently.                         PT Short Term Goals - 10/08/20 0810       PT SHORT TERM GOAL #1   Title Pt will be independent with HEP to improve leg strength in order to improve function at home and out in his yard.    Time 4    Period Weeks    Status Achieved    Target Date 09/08/20               PT Long Term Goals - 10/08/20 0810        PT LONG TERM GOAL #1   Title Pt will be able to work in the yard for at least 2 hours before his legs start to feel tired and fatigued so he can care for his property    Baseline 08/11/20: Legs start to feel week after working in the yard for around 1 hour, 09/17/20: Pt reports that he believes he  could work for 2 hours out in the yard. 10/08/20: At least 2 hours    Time 8    Period Weeks    Status Achieved      PT LONG TERM GOAL #2   Title Pt will increase his FOTO score to at least 71 to demonstrate significant improvement in his function related to his leg weakness    Baseline 08/11/20: 60; 09/17/20: 69; 10/08/20: 81    Time 8    Period Weeks    Status Achieved      PT LONG TERM GOAL #3   Title Pt will report at least 75% improvement in his symptoms in order to be able to work in the yard longer without experiencing leg fatigue.    Baseline 09/17/20: 50% improvement; 10/08/20: 75% improvement    Time 8    Period Weeks    Status Achieved                   Plan - 10/08/20 0810     Clinical Impression Statement Pt arrives with excellent motivation to participate in therapy. Updated outcome measures and goals with patient today. Overall he reports approximately 75% improvement in his symptoms since first starting therapy. The only limitation currently is his L knee pain which was not the reason for his referral to therapy. His FOTO score improved from 60 at initial evaluation to 81 today. He states that he can work for at least 2 hours in the yard without any increase in his leg weakness/fatigue. Continued strengthening with patient today and he is able to complete all exercises without reported increase in his leg fatigue/weakness. HEP reviewed with patient today.  Pt will be discharged today having met all of his goals. He agreed to continue his HEP independently.    Personal Factors and Comorbidities Age;Comorbidity 3+;Fitness;Past/Current Experience;Time since onset of  injury/illness/exacerbation    Comorbidities Stroke, HTN, hyperlipidemia, prostate and kidney cancer, CKD    Examination-Activity Limitations Bend;Carry;Stand;Transfers    Examination-Participation Restrictions Community Activity;Yard Work    Stability/Clinical Decision Making Unstable/Unpredictable    Rehab Potential Good    PT Frequency 2x / week    PT Duration 8 weeks    PT Treatment/Interventions ADLs/Self Care Home Management;Aquatic Therapy;Biofeedback;Canalith Repostioning;Cryotherapy;Electrical Stimulation;Iontophoresis 31m/ml Dexamethasone;Moist Heat;Traction;Ultrasound;DME Instruction;Gait training;Stair training;Functional mobility training;Therapeutic activities;Therapeutic exercise;Balance training;Neuromuscular re-education;Cognitive remediation;Patient/family education;Manual techniques;Passive range of motion;Dry needling;Vestibular;Spinal Manipulations;Joint Manipulations    PT Next Visit Plan Discharge    PT Home Exercise Plan Access Code: [facility]   Consulted and Agree with Plan of Care Patient             Patient will benefit from skilled therapeutic intervention in order to improve the following deficits and impairments:  Decreased strength, Decreased endurance, Difficulty walking  Visit Diagnosis: Muscle weakness (generalized)  Difficulty in walking, not elsewhere classified     Problem List Patient Active Problem List   Diagnosis Date Noted   Leg pain 06/22/2020   Personal history of colonic polyps    Polyp of descending colon    CKD (chronic kidney disease) stage 3, GFR 30-59 ml/min ([place] 12/28/2017   Osteopenia 12/28/2017   Polyp of gallbladder 12/28/2017   Right bundle branch block 12/28/2017   SAH (subarachnoid hemorrhage) ([place] 11/11/2016   Essential hypertension 11/09/2016   Mixed hyperlipidemia 11/08/2016   Nontraumatic cortical hemorrhage of left cerebral hemisphere ([place] 11/08/2016   Personal history of renal cell carcinoma 11/08/2016    Cerebrovascular accident ([place] 10/18/2016   History of kidney  cancer 01/19/2007   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[place]) 01/12/2001   Phillips Grout PT, DPT, GCS  Flavius Repsher 10/08/2020, 4:01 PM  [facility] [address] [place], [place], [address] Phone: [phone]   Fax:  [phone]  Name: Jason Krause MRN: [id number] Date of Birth: Jan 26, 1946

## 2020-10-11 DIAGNOSIS — G4733 Obstructive sleep apnea (adult) (pediatric): Secondary | ICD-10-CM | POA: Diagnosis not present

## 2020-10-14 DIAGNOSIS — G4733 Obstructive sleep apnea (adult) (pediatric): Secondary | ICD-10-CM | POA: Diagnosis not present

## 2020-10-15 DIAGNOSIS — G4733 Obstructive sleep apnea (adult) (pediatric): Secondary | ICD-10-CM | POA: Diagnosis not present

## 2020-10-23 ENCOUNTER — Ambulatory Visit: Payer: Medicare PPO | Admitting: Family Medicine

## 2020-10-23 ENCOUNTER — Encounter: Payer: Self-pay | Admitting: Family Medicine

## 2020-10-23 ENCOUNTER — Other Ambulatory Visit: Payer: Self-pay

## 2020-10-23 VITALS — BP 114/72 | HR 64 | Ht 70.0 in | Wt 193.0 lb

## 2020-10-23 DIAGNOSIS — J301 Allergic rhinitis due to pollen: Secondary | ICD-10-CM | POA: Diagnosis not present

## 2020-10-23 DIAGNOSIS — E782 Mixed hyperlipidemia: Secondary | ICD-10-CM | POA: Diagnosis not present

## 2020-10-23 DIAGNOSIS — I10 Essential (primary) hypertension: Secondary | ICD-10-CM | POA: Diagnosis not present

## 2020-10-23 MED ORDER — PROPRANOLOL HCL ER 60 MG PO CP24: 180.0000 mg | ORAL_CAPSULE | Freq: Every day | ORAL | 1 refills | Status: DC

## 2020-10-23 MED ORDER — ROSUVASTATIN CALCIUM 10 MG PO TABS: ORAL_TABLET | ORAL | 1 refills | Status: DC

## 2020-10-23 MED ORDER — FLUTICASONE PROPIONATE 50 MCG/ACT NA SUSP: 2.0000 | Freq: Every day | NASAL | 6 refills | Status: DC

## 2020-10-23 MED ORDER — LISINOPRIL 20 MG PO TABS: 20.0000 mg | ORAL_TABLET | Freq: Every day | ORAL | 1 refills | Status: DC

## 2020-10-23 NOTE — Progress Notes (Signed)
Date:  10/23/2020   Name:  Jason Krause   DOB:  Nov 28, 1945   MRN:  [id number]   Chief Complaint: Allergic Rhinitis , Hypertension, and Hyperlipidemia  Hypertension This is a chronic problem. The current episode started more than 1 year ago. The problem has been waxing and waning since onset. The problem is controlled. Pertinent negatives include no anxiety, blurred vision, chest pain, headaches, malaise/fatigue, neck pain, orthopnea, palpitations, peripheral edema, PND, shortness of breath or sweats. There are no associated agents to hypertension. There are no known risk factors for coronary artery disease. Past treatments include beta blockers and ACE inhibitors. The current treatment provides moderate improvement. There are no compliance problems.  There is no history of angina, kidney disease, CAD/MI, CVA, heart failure, left ventricular hypertrophy, PVD or retinopathy. There is no history of chronic renal disease, a hypertension causing med or renovascular disease.  Hyperlipidemia This is a chronic problem. The current episode started more than 1 year ago. The problem is controlled. Recent lipid tests were reviewed and are variable. He has no history of chronic renal disease, diabetes, hypothyroidism, liver disease, obesity or nephrotic syndrome. Pertinent negatives include no chest pain, focal sensory loss, focal weakness, leg pain, myalgias or shortness of breath. Current antihyperlipidemic treatment includes statins. The current treatment provides moderate improvement of lipids. There are no compliance problems.  Risk factors for coronary artery disease include hypertension.   Lab Results  Component Value Date   CREATININE 1.43 (H) 04/23/2020   BUN 18 04/23/2020   NA 148 (H) 04/23/2020   K 4.5 04/23/2020   CL 106 04/23/2020   CO2 24 04/23/2020   Lab Results  Component Value Date   CHOL 181 04/23/2020   HDL 45 04/23/2020   LDLCALC 123 (H) 04/23/2020   TRIG 69 04/23/2020   CHOLHDL  3.6 11/06/2019   No results found for: TSH No results found for: HGBA1C No results found for: WBC, HGB, HCT, MCV, PLT Lab Results  Component Value Date   ALT 15 11/06/2019   AST 23 11/06/2019   ALKPHOS 98 11/06/2019   BILITOT 1.0 11/06/2019     Review of Systems  Constitutional:  Negative for chills, fever and malaise/fatigue.  HENT:  Negative for drooling, ear discharge, ear pain and sore throat.   Eyes:  Negative for blurred vision.  Respiratory:  Negative for cough, shortness of breath and wheezing.   Cardiovascular:  Negative for chest pain, palpitations, orthopnea, leg swelling and PND.  Gastrointestinal:  Negative for abdominal pain, blood in stool, constipation, diarrhea and nausea.  Endocrine: Negative for polydipsia.  Genitourinary:  Negative for dysuria, frequency, hematuria and urgency.  Musculoskeletal:  Negative for back pain, myalgias and neck pain.  Skin:  Negative for rash.  Allergic/Immunologic: Negative for environmental allergies.  Neurological:  Negative for dizziness, focal weakness and headaches.  Hematological:  Does not bruise/bleed easily.  Psychiatric/Behavioral:  Negative for suicidal ideas. The patient is not nervous/anxious.    Patient Active Problem List   Diagnosis Date Noted   Leg pain 06/22/2020   Personal history of colonic polyps    Polyp of descending colon    CKD (chronic kidney disease) stage 3, GFR 30-59 ml/min ([place]) 12/28/2017   Osteopenia 12/28/2017   Polyp of gallbladder 12/28/2017   Right bundle branch block 12/28/2017   SAH (subarachnoid hemorrhage) ([place]) 11/11/2016   Essential hypertension 11/09/2016   Mixed hyperlipidemia 11/08/2016   Nontraumatic cortical hemorrhage of left cerebral hemisphere ([place]) 11/08/2016   Personal  history of renal cell carcinoma 11/08/2016   Cerebrovascular accident [facility]) 10/18/2016   History of kidney cancer 01/19/2007   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[place]) 01/12/2001    Allergies  Allergen Reactions    Carvedilol Other (See Comments)    "makes me a zombie"    Past Surgical History:  Procedure Laterality Date   COLONOSCOPY WITH PROPOFOL N/A 12/06/2019   Procedure: COLONOSCOPY and biopsy WITH PROPOFOL;  Surgeon: Lucilla Lame, MD;  Location: [facility];  Service: Endoscopy;  Laterality: N/A;  priority 4   HERNIA REPAIR     kidney removal due to cancer Right    POLYPECTOMY N/A 12/06/2019   Procedure: POLYPECTOMY;  Surgeon: Lucilla Lame, MD;  Location: [facility];  Service: Endoscopy;  Laterality: N/A;   PROSTATE SURGERY     removed    Social History   Tobacco Use   Smoking status: Never   Smokeless tobacco: Never  Vaping Use   Vaping Use: Never used  Substance Use Topics   Alcohol use: Yes    Comment: social   Drug use: Never     Medication list has been reviewed and updated.  Current Meds  Medication Sig   Coenzyme Q10 100 MG capsule Take 1 capsule by mouth daily.   fluticasone (FLONASE) 50 MCG/ACT nasal spray Place 2 sprays into both nostrils daily.   folic acid (FOLVITE) 789 MCG tablet Take 1 tablet by mouth daily.   Glucos-Chondroit-Hyaluron-MSM (GLUCOSAMINE CHONDROITIN JOINT PO) Take 2 tablets by mouth daily.   lisinopril (ZESTRIL) 20 MG tablet Take 1 tablet (20 mg total) by mouth daily.   Multiple Vitamins-Minerals (CENTRUM SILVER PO) Take 1 tablet by mouth daily.   propranolol ER (INDERAL LA) 60 MG 24 hr capsule Take 3 capsules (180 mg total) by mouth daily. 2 capsules in am and 1 in the pm   rosuvastatin (CRESTOR) 10 MG tablet TAKE (1) TABLET BY MOUTH EVERY DAY    PHQ 2/9 Scores 10/23/2020 07/18/2020 04/23/2020 03/26/2020  PHQ - 2 Score 0 0 0 0  PHQ- 9 Score 0 0 0 -    GAD 7 : Generalized Anxiety Score 10/23/2020 07/18/2020 11/06/2019 05/08/2019  Nervous, Anxious, on Edge 0 0 0 0  Control/stop worrying 0 0 0 0  Worry too much - different things 0 0 0 0  Trouble relaxing 0 0 0 0  Restless 0 0 0 0  Easily annoyed or irritable 0 0 0 0  Afraid - awful  might happen 0 0 0 0  Total GAD 7 Score 0 0 0 0    BP Readings from Last 3 Encounters:  10/23/20 114/72  07/18/20 110/78  06/19/20 110/79    Physical Exam Vitals and nursing note reviewed.  HENT:     Head: Normocephalic.     Right Ear: Tympanic membrane, ear canal and external ear normal.     Left Ear: Tympanic membrane, ear canal and external ear normal.     Nose: Nose normal.  Eyes:     General: No scleral icterus.       Right eye: No discharge.        Left eye: No discharge.     Conjunctiva/sclera: Conjunctivae normal.     Pupils: Pupils are equal, round, and reactive to light.  Neck:     Thyroid: No thyromegaly.     Vascular: No carotid bruit or JVD.     Trachea: No tracheal deviation.  Cardiovascular:     Rate and Rhythm: Normal rate  and regular rhythm.     Heart sounds: Normal heart sounds. No murmur heard.   No friction rub. No gallop.  Pulmonary:     Effort: No respiratory distress.     Breath sounds: Normal breath sounds. No wheezing, rhonchi or rales.  Abdominal:     General: Bowel sounds are normal.     Palpations: Abdomen is soft. There is no mass.     Tenderness: There is no abdominal tenderness. There is no guarding or rebound.  Musculoskeletal:        General: No tenderness. Normal range of motion.     Cervical back: Normal range of motion and neck supple.  Lymphadenopathy:     Cervical: No cervical adenopathy.  Skin:    General: Skin is warm.     Findings: No rash.  Neurological:     Mental Status: He is alert and oriented to person, place, and time.     Cranial Nerves: No cranial nerve deficit.     Deep Tendon Reflexes: Reflexes are normal and symmetric.    Wt Readings from Last 3 Encounters:  10/23/20 193 lb (87.5 kg)  07/18/20 195 lb 12.8 oz (88.8 kg)  06/19/20 194 lb (88 kg)    BP 114/72   Pulse 64   Ht 5\' 10"  (1.778 m)   Wt 193 lb (87.5 kg)   BMI 27.69 kg/m   Assessment and Plan:  1. Seasonal allergic rhinitis due to  pollen Chronic.  Controlled.  Stable.  Continue Flonase nasal spray 2 sprays in both nostrils daily. - fluticasone (FLONASE) 50 MCG/ACT nasal spray; Place 2 sprays into both nostrils daily.  Dispense: 16 g; Refill: 6  2. Essential hypertension Chronic.  Controlled.  Stable.  Blood pressure 114/72.  History and physical exam of a cardiac nature is unremarkable.  We will continue lisinopril 20 mg once a day and propranolol ER 60 mg 3 capsules in the dosing of 2 capsules in the morning and 1 capsule in the p.m.  We will check CMP for current renal function as well as LFTs. - lisinopril (ZESTRIL) 20 MG tablet; Take 1 tablet (20 mg total) by mouth daily.  Dispense: 90 tablet; Refill: 1 - propranolol ER (INDERAL LA) 60 MG 24 hr capsule; Take 3 capsules (180 mg total) by mouth daily. 2 capsules in am and 1 in the pm  Dispense: 270 capsule; Refill: 1 - Comprehensive Metabolic Panel (CMET)  3. Mixed hyperlipidemia Chronic.  Controlled.  Stable.  Review of previous lipid panels are acceptable but we will recheck a lipid panel today as well as checking LFTs to make sure statin is not causing any hepatotoxicity.  Patient will continue Crestor 10 mg once a day. - rosuvastatin (CRESTOR) 10 MG tablet; TAKE (1) TABLET BY MOUTH EVERY DAY  Dispense: 90 tablet; Refill: 1 - Lipid Panel With LDL/HDL Ratio

## 2020-10-24 LAB — COMPREHENSIVE METABOLIC PANEL
ALT: 18 IU/L (ref 0–44)
AST: 22 IU/L (ref 0–40)
Albumin/Globulin Ratio: 1.8 (ref 1.2–2.2)
Albumin: 4.1 g/dL (ref 3.7–4.7)
Alkaline Phosphatase: 89 IU/L (ref 44–121)
BUN/Creatinine Ratio: 12 (ref 10–24)
BUN: 17 mg/dL (ref 8–27)
Bilirubin Total: 0.9 mg/dL (ref 0.0–1.2)
CO2: 25 mmol/L (ref 20–29)
Calcium: 9.9 mg/dL (ref 8.6–10.2)
Chloride: 107 mmol/L — ABNORMAL HIGH (ref 96–106)
Creatinine, Ser: 1.44 mg/dL — ABNORMAL HIGH (ref 0.76–1.27)
Globulin, Total: 2.3 g/dL (ref 1.5–4.5)
Glucose: 86 mg/dL (ref 65–99)
Potassium: 4.7 mmol/L (ref 3.5–5.2)
Sodium: 144 mmol/L (ref 134–144)
Total Protein: 6.4 g/dL (ref 6.0–8.5)
eGFR: 51 mL/min/{1.73_m2} — ABNORMAL LOW (ref >59)

## 2020-10-24 LAB — LIPID PANEL WITH LDL/HDL RATIO
Cholesterol, Total: 150 mg/dL (ref 100–199)
HDL: 42 mg/dL (ref >39)
LDL Chol Calc (NIH): 94 mg/dL (ref 0–99)
LDL/HDL Ratio: 2.2 ratio (ref 0.0–3.6)
Triglycerides: 69 mg/dL (ref 0–149)
VLDL Cholesterol Cal: 14 mg/dL (ref 5–40)

## 2020-10-28 DIAGNOSIS — G4733 Obstructive sleep apnea (adult) (pediatric): Secondary | ICD-10-CM | POA: Diagnosis not present

## 2020-10-29 DIAGNOSIS — R278 Other lack of coordination: Secondary | ICD-10-CM | POA: Diagnosis not present

## 2020-10-29 DIAGNOSIS — G250 Essential tremor: Secondary | ICD-10-CM | POA: Insufficient documentation

## 2020-10-29 DIAGNOSIS — M25562 Pain in left knee: Secondary | ICD-10-CM | POA: Insufficient documentation

## 2020-10-29 DIAGNOSIS — R29898 Other symptoms and signs involving the musculoskeletal system: Secondary | ICD-10-CM | POA: Diagnosis not present

## 2020-10-29 DIAGNOSIS — M25561 Pain in right knee: Secondary | ICD-10-CM | POA: Diagnosis not present

## 2020-10-30 ENCOUNTER — Other Ambulatory Visit: Payer: Self-pay | Admitting: Family Medicine

## 2020-10-30 DIAGNOSIS — E782 Mixed hyperlipidemia: Secondary | ICD-10-CM

## 2020-10-30 NOTE — Telephone Encounter (Signed)
  Notes to clinic:  : PATIENT REQUESTS 90 DAY SUPPLY   Requested Prescriptions  Pending Prescriptions Disp Refills   rosuvastatin (CRESTOR) 10 MG tablet [Pharmacy Med Name: ROSUVASTATIN CALCIUM 10 MG TAB] 30 tablet     Sig: TAKE (1) TABLET BY MOUTH EVERY DAY      Cardiovascular:  Antilipid - Statins Passed - 10/30/2020 11:50 AM      Passed - Total Cholesterol in normal range and within 360 days    Cholesterol, Total  Date Value Ref Range Status  10/23/2020 150 100 - 199 mg/dL Final          Passed - LDL in normal range and within 360 days    LDL Chol Calc (NIH)  Date Value Ref Range Status  10/23/2020 94 0 - 99 mg/dL Final          Passed - HDL in normal range and within 360 days    HDL  Date Value Ref Range Status  10/23/2020 42 >39 mg/dL Final          Passed - Triglycerides in normal range and within 360 days    Triglycerides  Date Value Ref Range Status  10/23/2020 69 0 - 149 mg/dL Final          Passed - Patient is not pregnant      Passed - Valid encounter within last 12 months    Recent Outpatient Visits           1 week ago Essential hypertension   [place], Deanna C, MD   3 months ago Strep pharyngitis   [facility] Juline Patch, MD   4 months ago Proximal muscle weakness   [facility] Juline Patch, MD   6 months ago Essential hypertension   [place], Deanna C, MD   11 months ago Essential hypertension   [place], Deanna C, MD       Future Appointments             In 2 weeks Diamantina Providence, Herbert Seta, MD [place]   In 5 months Juline Patch, MD [facility], [facility]

## 2020-11-05 ENCOUNTER — Ambulatory Visit: Payer: Self-pay | Admitting: *Deleted

## 2020-11-05 NOTE — Telephone Encounter (Signed)
Patient's wife called back after missing call form clinic. Reviewed message from Mendel Ryder, CMA [facility] scheduled for tomorrow at 1140 am with Dr. Ronnald Ramp. Patient's wife reports patient is now having some shortness of breath after ambulating from bathroom. Confirms positive at home covid test today . Instructed wife to promote hydration. Patient's wife reports she is getting someone to bring her a thermometer. Reviewed CDC isolation guidelines with wife and for her to be tested with in 5 days also and sooner if symptoms noted. Patient denies chest pain , difficulty breathing at this time.Wife would like to know if patent can get prescribed antiviral medication. Hx 1 kidney. Stage 3 CKD. Care advise given to patient's wife. Patient 's wife verbalized understanding of care advise and to call back or go to ED or call 911 if symptoms worsen.

## 2020-11-05 NOTE — Telephone Encounter (Signed)
Pt tested positive for covid this afternoon, home test. Symptoms started 0500 this AM, sore throat. Subjective fever, "Feels hot, chills, shaky." Spoke to wife, on [place], pt present. Denies any SOB, no wheezing or CP. Reports increased fatigue, sore throat, mild dry cough. Pt is staying hydrated. Pt is interested in one of the oral anti-viral meds if Dr. Ronnald Ramp agrees appropriate. Pt has St.#3 CKD.  Guidelines for self isolation reviewed as well as symptoms that warrant an ED eval.  Verbalizes understanding. CB# [phone]

## 2020-11-05 NOTE — Telephone Encounter (Signed)
Reason for Disposition  [1] HIGH RISK for severe COVID complications (e.g., weak immune system, age > 69 years, obesity with BMI > 25, pregnant, chronic lung disease or other chronic medical condition) AND [2] COVID symptoms (e.g., cough, fever)  (Exceptions: Already seen by PCP and no new or worsening symptoms.)  Answer Assessment - Initial Assessment Questions 1. COVID-19 DIAGNOSIS: "Who made your COVID-19 diagnosis?" "Was it confirmed by a positive lab test or self-test?" If not diagnosed by a doctor (or NP/PA), ask "Are there lots of cases (community spread) where you live?" Note: See public health department website, if unsure.     Has not been tested , requesting to take at home test today  2. COVID-19 EXPOSURE: "Was there any known exposure to COVID before the symptoms began?" CDC Definition of close contact: within 6 feet (2 meters) for a total of 15 minutes or more over a 24-hour period.      unknown 3. ONSET: "When did the COVID-19 symptoms start?"      Last night after midnight 4. WORST SYMPTOM: "What is your worst symptom?" (e.g., cough, fever, shortness of breath, muscle aches)     Shaking, cold and hot at the same time. Sore throat runny nose, sneezing  5. COUGH: "Do you have a cough?" If Yes, ask: "How bad is the cough?"       No  6. FEVER: "Do you have a fever?" If Yes, ask: "What is your temperature, how was it measured, and when did it start?"     Cold and hot . Does not have thermometer to check temp 7. RESPIRATORY STATUS: "Describe your breathing?" (e.g., shortness of breath, wheezing, unable to speak)      Ok. No difficulty breathing  8. BETTER-SAME-WORSE: "Are you getting better, staying the same or getting worse compared to yesterday?"  If getting worse, ask, "In what way?"     Worse today  9. HIGH RISK DISEASE: "Do you have any chronic medical problems?" (e.g., asthma, heart or lung disease, weak immune system, obesity, etc.)     no 10. VACCINE: "Have you had the  COVID-19 vaccine?" If Yes, ask: "Which one, how many shots, when did you get it?"       Yes Moderna  11. BOOSTER: "Have you received your COVID-19 booster?" If Yes, ask: "Which one and when did you get it?"       Yes x 1 Moderna  12. PREGNANCY: "Is there any chance you are pregnant?" "When was your last menstrual period?"       na 13. OTHER SYMPTOMS: "Do you have any other symptoms?"  (e.g., chills, fatigue, headache, loss of smell or taste, muscle pain, sore throat)       Chills, sore throat, hot / cold shaking, runny nose , sneezing  14. O2 SATURATION MONITOR:  "Do you use an oxygen saturation monitor (pulse oximeter) at home?" If Yes, ask "What is your reading (oxygen level) today?" "What is your usual oxygen saturation reading?" (e.g., 95%)       na  Protocols used: Coronavirus (COVID-19) Diagnosed or Suspected-A-AH

## 2020-11-05 NOTE — Telephone Encounter (Signed)
Reason for Disposition  [1] COVID-19 diagnosed by positive lab test (e.g., PCR, rapid self-test kit) AND [2] mild symptoms (e.g., cough, fever, others) AND [3] no complications or SOB  Answer Assessment - Initial Assessment Questions 1. COVID-19 DIAGNOSIS: "Who made your COVID-19 diagnosis?" "Was it confirmed by a positive lab test or self-test?" If not diagnosed by a doctor (or NP/PA), ask "Are there lots of cases (community spread) where you live?" Note: See public health department website, if unsure.     Home test this afternoon 2. COVID-19 EXPOSURE: "Was there any known exposure to COVID before the symptoms began?" CDC Definition of close contact: within 6 feet (2 meters) for a total of 15 minutes or more over a 24-hour period.      no 3. ONSET: "When did the COVID-19 symptoms start?"      This AM, sore throat 4. WORST SYMPTOM: "What is your worst symptom?" (e.g., cough, fever, shortness of breath, muscle aches)     Subjective fever, chills 5. COUGH: "Do you have a cough?" If Yes, ask: "How bad is the cough?"       Mild dry cough 6. FEVER: "Do you have a fever?" If Yes, ask: "What is your temperature, how was it measured, and when did it start?"     "Feels hot" 7. RESPIRATORY STATUS: "Describe your breathing?" (e.g., shortness of breath, wheezing, unable to speak)      no 8. BETTER-SAME-WORSE: "Are you getting better, staying the same or getting worse compared to yesterday?"  If getting worse, ask, "In what way?"     Worse, symptoms started this AM 9. HIGH RISK DISEASE: "Do you have any chronic medical problems?" (e.g., asthma, heart or lung disease, weak immune system, obesity, etc.)    yes 10. VACCINE: "Have you had the COVID-19 vaccine?" If Yes, ask: "Which one, how many shots, when did you get it?"       Yes. Moderna. 11. BOOSTER: "Have you received your COVID-19 booster?" If Yes, ask: "Which one and when did you get it?"       1 booster mid dec. 2021  13. OTHER SYMPTOMS: "Do you  have any other symptoms?"  (e.g., chills, fatigue, headache, loss of smell or taste, muscle pain, sore throat)       Sore throat 14. O2 SATURATION MONITOR:  "Do you use an oxygen saturation monitor (pulse oximeter) at home?" If Yes, ask "What is your reading (oxygen level) today?" "What is your usual oxygen saturation reading?" (e.g., 95%)       No  Protocols used: Coronavirus (COVID-19) Diagnosed or Suspected-A-AH

## 2020-11-05 NOTE — Telephone Encounter (Signed)
Scheduled for tomorrow VV at 1140 AM with Dr Ronnald Ramp.

## 2020-11-05 NOTE — Telephone Encounter (Signed)
C/o shaking, hot / cold at the same time since last night at midnight. Sore throat this am but better now. Runny nose and stuffy at times. Sneezing. Denies chest pain , difficulty breathing, no headache, no N/V/D. Can eat and drink. Patient unable to check for fever no thermometer. Patient concerned sx are from his CPAP. Instructed patient to do at home covid test . My Chart VV scheduled for 11/06/20. Patient would like in person OV. Recommended if covid test negative to call clinic back and could reschedule in person OV if no fever. Care advise given. Patient verbalized understanding of care advise and to call back or go to [facility] or ED if symptoms worsen.

## 2020-11-06 ENCOUNTER — Other Ambulatory Visit: Payer: Self-pay

## 2020-11-06 ENCOUNTER — Telehealth (INDEPENDENT_AMBULATORY_CARE_PROVIDER_SITE_OTHER): Payer: Medicare PPO | Admitting: Family Medicine

## 2020-11-06 ENCOUNTER — Encounter: Payer: Self-pay | Admitting: Family Medicine

## 2020-11-06 VITALS — Temp 98.0°F | Ht 70.0 in | Wt 193.0 lb

## 2020-11-06 DIAGNOSIS — U071 COVID-19: Secondary | ICD-10-CM | POA: Diagnosis not present

## 2020-11-06 DIAGNOSIS — E860 Dehydration: Secondary | ICD-10-CM

## 2020-11-06 MED ORDER — MOLNUPIRAVIR EUA 200MG CAPSULE: 4.0000 | ORAL_CAPSULE | Freq: Two times a day (BID) | ORAL | 0 refills | Status: AC

## 2020-11-06 NOTE — Progress Notes (Addendum)
Date:  11/06/2020   Name:  Jason Krause   DOB:  [birth year]   MRN:  [id number]   Chief Complaint: Covid Positive (Symptoms started 2 days ago with scratchy throat and stuffy nose. Yesterday started started with chills and fever- 101.4. Taking Tylenol for fever. Positive home test yesterday at lunch. )  I Army Fossa, MD connected with this patient, Jason Krause, by telephone at the patient's home.  I verified that I am speaking with the correct person using two identifiers. This visit was conducted via telephone due to the Covid-19 outbreak from my office at [facility] in [place], [place]. I discussed the limitations, risks, security and privacy concerns of performing an evaluation and management service by telephone. I also discussed with the patient that there may be a patient responsible charge related to this service. The patient expressed understanding and agreed to proceed.    Fever  This is a new problem. The current episode started yesterday. The problem occurs intermittently. The problem has been gradually improving. The maximum temperature noted was 101 to 101.9 F. Associated symptoms include a sore throat. Pertinent negatives include no chest pain, congestion, coughing, ear pain, headaches, muscle aches or wheezing. Associated symptoms comments: fatigue. He has tried fluids and acetaminophen for the symptoms. The treatment provided mild relief.   Lab Results  Component Value Date   CREATININE 1.44 (H) 10/23/2020   BUN 17 10/23/2020   NA 144 10/23/2020   K 4.7 10/23/2020   CL 107 (H) 10/23/2020   CO2 25 10/23/2020   Lab Results  Component Value Date   CHOL 150 10/23/2020   HDL 42 10/23/2020   LDLCALC 94 10/23/2020   TRIG 69 10/23/2020   CHOLHDL 3.6 11/06/2019   No results found for: TSH No results found for: HGBA1C No results found for: WBC, HGB, HCT, MCV, PLT Lab Results  Component Value Date   ALT 18 10/23/2020   AST 22 10/23/2020   ALKPHOS 89 10/23/2020    BILITOT 0.9 10/23/2020     Review of Systems  Constitutional:  Positive for fever.  HENT:  Positive for sneezing and sore throat. Negative for congestion, ear pain, nosebleeds, rhinorrhea, sinus pressure and sinus pain.   Respiratory:  Negative for cough, chest tightness, shortness of breath and wheezing.   Cardiovascular:  Negative for chest pain.  Neurological:  Negative for headaches.   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[place]) 11/08/2016   Personal history of renal cell carcinoma 11/08/2016   Cerebrovascular accident ([place]) 10/18/2016   History of kidney cancer 01/19/2007   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[place]) 01/12/2001    Allergies  Allergen Reactions   Carvedilol Other (See Comments)    "makes me a zombie"    Past Surgical History:  Procedure Laterality Date   COLONOSCOPY WITH PROPOFOL N/A 12/06/2019   Procedure: COLONOSCOPY and biopsy WITH PROPOFOL;  Surgeon: Lucilla Lame, MD;  Location: [place];  Service: Endoscopy;  Laterality: N/A;  priority 4   HERNIA REPAIR     kidney  removal due to cancer Right    POLYPECTOMY N/A 12/06/2019   Procedure: POLYPECTOMY;  Surgeon: Lucilla Lame, MD;  Location: [place];  Service: Endoscopy;  Laterality: N/A;   PROSTATE SURGERY     removed    Social History   Tobacco Use   Smoking status: Never   Smokeless tobacco: Never  Vaping Use   Vaping Use: Never used  Substance Use Topics   Alcohol use: Yes    Comment: social   Drug use: Never     Medication list  has been reviewed and updated.  Current Meds  Medication Sig   Coenzyme Q10 100 MG capsule Take 1 capsule by mouth daily.   folic acid (FOLVITE) A999333 MCG tablet Take 1 tablet by mouth daily.   Glucos-Chondroit-Hyaluron-MSM (GLUCOSAMINE CHONDROITIN JOINT PO) Take 2 tablets by mouth daily.   lisinopril (ZESTRIL) 20 MG tablet Take 1 tablet (20 mg total) by mouth daily.   Multiple Vitamins-Minerals (CENTRUM SILVER PO) Take 1 tablet by mouth daily.   propranolol ER (INDERAL LA) 60 MG 24 hr capsule Take 3 capsules (180 mg total) by mouth daily. 2 capsules in am and 1 in the pm   rosuvastatin (CRESTOR) 10 MG tablet TAKE (1) TABLET BY MOUTH EVERY DAY    PHQ 2/9 Scores 11/06/2020 10/23/2020 07/18/2020 04/23/2020  PHQ - 2 Score 0 0 0 0  PHQ- 9 Score 0 0 0 0    GAD 7 : Generalized Anxiety Score 11/06/2020 10/23/2020 07/18/2020 11/06/2019  Nervous, Anxious, on Edge 0 0 0 0  Control/stop worrying 0 0 0 0  Worry too much - different things 0 0 0 0  Trouble relaxing 0 0 0 0  Restless 0 0 0 0  Easily annoyed or irritable 0 0 0 0  Afraid - awful might happen 0 0 0 0  Total GAD 7 Score 0 0 0 0    BP Readings from Last 3 Encounters:  10/23/20 114/72  07/18/20 110/78  06/19/20 110/79    Physical Exam Nursing note reviewed.    Wt Readings from Last 3 Encounters:  11/06/20 193 lb (87.5 kg)  10/23/20 193 lb (87.5 kg)  07/18/20 195 lb 12.8 oz (88.8 kg)    Temp 98 F (36.7 C) (Oral)   Ht '5\' 10"'$  (1.778 m)   Wt 193 lb (87.5 kg)   BMI 27.69 kg/m   Assessment and Plan:  1. COVID-19 Patient developed symptoms yesterday with a positive COVID test as well.  Patient is doing some better today but is still having some symptoms and with a risk score of 4 we have discussed current symptoms and what to expect and also any possibilities of worsening that he would need to go to the hospital with any of the symptoms as discussed.  Patient has had some dehydration with concentration of his urine but no dizziness and  we have encouraged fluids.  We have initiated Mo;nupiravir 200 mg 4 bid.  2. Dehydration Fluids discussed and patient is currently improving but will continue to push fluids particularly if fever should reemerge.  I spent 10 minutes with this patient, More than 50% of that time was spent in face to face education, counseling and care coordination.

## 2020-11-06 NOTE — Patient Instructions (Signed)
COVID-19: What to Do if You Are Sick CDC has updated isolation and quarantine recommendations for the public, and is revising the CDC website to reflect these changes. These recommendations do not apply to healthcare personnel and do not supersede state, local, tribal, or territorial laws, rules, andregulations. If you have a fever, cough or other symptoms, you might have COVID-19. Most people have mild illness and are able to recover at home. If you are sick: Keep track of your symptoms. If you have an emergency warning sign (including trouble breathing), call 911. Steps to help prevent the spread of COVID-19 if you are sick If you are sick with COVID-19 or think you might have COVID-19, follow the steps below to care for yourself and to help protect other peoplein your home and community. Stay home except to get medical care Stay home. Most people with COVID-19 have mild illness and can recover at home without medical care. Do not leave your home, except to get medical care. Do not visit public areas. Take care of yourself. Get rest and stay hydrated. Take over-the-counter medicines, such as acetaminophen, to help you feel better. Stay in touch with your doctor. Call before you get medical care. Be sure to get care if you have trouble breathing, or have any other emergency warning signs, or if you think it is an emergency. Avoid public transportation, ride-sharing, or taxis. Separate yourself from other people As much as possible, stay in a specific room and away from other people and pets in your home. If possible, you should use a separate bathroom. If you need to be around other people or animals in oroutside of the home, wear a mask. Tell your close contactsthat they may have been exposed to COVID-19. An infected person can spread COVID-19 starting 48 hours (or 2 days) before the person has any symptoms or tests positive. By letting your close contacts know they may have been exposed to COVID-19,  you are helping to protect everyone. Additional guidance is available for those living in close quarters and shared housing. See COVID-19 and Animals if you have questions about pets. If you are diagnosed with COVID-19, someone from the health department may call you. Answer the call to slow the spread. Monitor your symptoms Symptoms of COVID-19 include fever, cough, or other symptoms. Follow care instructions from your healthcare provider and local health department. Your local health authorities may give instructions on checking your symptoms and reporting information. When to seek emergency medical attention Look for emergency warning signs* for COVID-19. If someone is showing any of these signs, seek emergency medical care immediately: Trouble breathing Persistent pain or pressure in the chest New confusion Inability to wake or stay awake Pale, gray, or blue-colored skin, lips, or nail beds, depending on skin tone *This list is not all possible symptoms. Please call your medical provider forany other symptoms that are severe or concerning to you. Call 911 or call ahead to your local emergency facility: Notify the operator that you are seeking care for someone who has or may haveCOVID-19. Call ahead before visiting your doctor Call ahead. Many medical visits for routine care are being postponed or done by phone or telemedicine. If you have a medical appointment that cannot be postponed, call your doctor's office, and tell them you have or may have COVID-19. This will help the office protect themselves and other patients. Get tested If you have symptoms of COVID-19, get tested. While waiting for test results, you stay away from others,  including staying apart from those living in your household. Self-tests are one of several options for testing for the virus that causes COVID-19 and may be more convenient than laboratory-based tests and point-of-care tests. Ask your healthcare provider or  your local health department if you need help interpreting your test results. You can visit your state, tribal, local, and territorial health department's website to look for the latest local information on testing sites. If you are sick, wear a mask over your nose and mouth You should wear a mask over your nose and mouth if you must be around other people or animals, including pets (even at home). You don't need to wear the mask if you are alone. If you can't put on a mask (because of trouble breathing, for example), cover your coughs and sneezes in some other way. Try to stay at least 6 feet away from other people. This will help protect the people around you. Masks should not be placed on young children under age 73 years, anyone who has trouble breathing, or anyone who is not able to remove the mask without help. Note: During the COVID-19 pandemic, medical grade facemasks are reserved forhealthcare workers and some first responders. Cover your coughs and sneezes Cover your mouth and nose with a tissue when you cough or sneeze. Throw away used tissues in a lined trash can. Immediately wash your hands with soap and water for at least 20 seconds. If soap and water are not available, clean your hands with an alcohol-based hand sanitizer that contains at least 60% alcohol. Clean your hands often Wash your hands often with soap and water for at least 20 seconds. This is especially important after blowing your nose, coughing, or sneezing; going to the bathroom; and before eating or preparing food. Use hand sanitizer if soap and water are not available. Use an alcohol-based hand sanitizer with at least 60% alcohol, covering all surfaces of your hands and rubbing them together until they feel dry. Soap and water are the best option, especially if hands are visibly dirty. Avoid touching your eyes, nose, and mouth with unwashed hands. Handwashing Tips Avoid sharing personal household items Do not share  dishes, drinking glasses, cups, eating utensils, towels, or bedding with other people in your home. Wash these items thoroughly after using them with soap and water or put in the dishwasher. Clean all "high-touch" surfaces every day Clean and disinfect high-touch surfaces in your "sick room" and bathroom; wear disposable gloves. Let someone else clean and disinfect surfaces in common areas, but you should clean your bedroom and bathroom, if possible. If a caregiver or other person needs to clean and disinfect a sick person's bedroom or bathroom, they should do so on an as-needed basis. The caregiver/other person should wear a mask and disposable gloves prior to cleaning. They should wait as long as possible after the person who is sick has used the bathroom before coming in to clean and use the bathroom. High-touch surfaces include phones, remote controls, counters, tabletops, doorknobs, bathroom fixtures, toilets, keyboards, tablets, and bedside tables. Clean and disinfect areas that may have blood, stool, or body fluids on them. Use household cleaners and disinfectants. Clean the area or item with soap and water or another detergent if it is dirty. Then, use a household disinfectant. Be sure to follow the instructions on the label to ensure safe and effective use of the product. Many products recommend keeping the surface wet for several minutes to ensure germs are killed. Many  also recommend precautions such as wearing gloves and making sure you have good ventilation during use of the product. Use a product from [employer] List N: Disinfectants for Coronavirus (YSAYT-01). Complete Disinfection Guidance When you can be around others after being sick with COVID-19 Deciding when you can be around others is different for different situations. Find out when you can safely end home isolation. For any additional questions about your care,contact your healthcare provider or state or local health  department. 03/26/2020 Content source: [facility] [address] [facility] for Immunization and Respiratory Diseases ([facility]), Division of Viral Diseases This information is not intended to replace advice given to you by your health care provider. Make sure you discuss any questions you have with your healthcare provider. Document Revised: 05/23/2020 Document Reviewed: 05/23/2020 Elsevier Patient Education  [place].

## 2020-11-07 ENCOUNTER — Telehealth: Payer: Self-pay

## 2020-11-07 ENCOUNTER — Other Ambulatory Visit: Payer: Self-pay | Admitting: Family Medicine

## 2020-11-07 MED ORDER — GUAIFENESIN-CODEINE 100-10 MG/5ML PO SYRP
5.0000 mL | ORAL_SOLUTION | Freq: Four times a day (QID) | ORAL | 0 refills | Status: DC | PRN
Start: 2020-11-07 — End: 2021-01-09

## 2020-11-07 NOTE — Telephone Encounter (Signed)
Called pt let him know that Dr. Ronnald Ramp will send in Robitussin with codeine. Pt verbalized understanding.  KP

## 2020-11-07 NOTE — Telephone Encounter (Unsigned)
Copied from [place] [id number]. Topic: General - Other >> Nov 07, 2020  1:42 PM Pawlus, Brayton Layman A wrote: Reason for CRM: Pt wanted a call back to go over his medications and what he is supposed to be taking.

## 2020-11-07 NOTE — Progress Notes (Signed)
Sent in [place] for cough

## 2020-11-10 DIAGNOSIS — G4733 Obstructive sleep apnea (adult) (pediatric): Secondary | ICD-10-CM | POA: Diagnosis not present

## 2020-11-13 DIAGNOSIS — G4733 Obstructive sleep apnea (adult) (pediatric): Secondary | ICD-10-CM | POA: Diagnosis not present

## 2020-11-18 ENCOUNTER — Ambulatory Visit: Payer: Medicare PPO | Admitting: Urology

## 2020-11-18 ENCOUNTER — Other Ambulatory Visit
Admission: RE | Admit: 2020-11-18 | Discharge: 2020-11-18 | Disposition: A | Discharge status: Home / Self Care | Payer: Medicare PPO | Attending: Urology | Admitting: Urology

## 2020-11-18 ENCOUNTER — Encounter: Payer: Self-pay | Admitting: Urology

## 2020-11-18 ENCOUNTER — Other Ambulatory Visit: Payer: Self-pay

## 2020-11-18 ENCOUNTER — Telehealth: Payer: Self-pay

## 2020-11-18 VITALS — BP 118/82 | HR 60 | Ht 70.0 in | Wt 191.2 lb

## 2020-11-18 DIAGNOSIS — R399 Unspecified symptoms and signs involving the genitourinary system: Secondary | ICD-10-CM | POA: Insufficient documentation

## 2020-11-18 DIAGNOSIS — C641 Malignant neoplasm of right kidney, except renal pelvis: Secondary | ICD-10-CM | POA: Insufficient documentation

## 2020-11-18 DIAGNOSIS — C61 Malignant neoplasm of prostate: Secondary | ICD-10-CM | POA: Diagnosis not present

## 2020-11-18 LAB — URINALYSIS, COMPLETE (UACMP) WITH MICROSCOPIC
Bilirubin Urine: NEGATIVE
Glucose, UA: NEGATIVE mg/dL
Hgb urine dipstick: NEGATIVE
Ketones, ur: NEGATIVE mg/dL
Leukocytes,Ua: NEGATIVE
Nitrite: NEGATIVE
Specific Gravity, Urine: 1.025 (ref 1.005–1.030)
pH: 5 (ref 5.0–8.0)

## 2020-11-18 NOTE — Telephone Encounter (Signed)
-----   Message from Billey Co, MD sent at 11/18/2020  2:05 PM EDT ----- Urinalysis with no microscopic blood or evidence of infection.  Would recommend cystoscopy to rule out any stricture or scar tissue as the cause of his urinary symptoms  Nickolas Madrid, MD 11/18/2020

## 2020-11-18 NOTE — Patient Instructions (Signed)
Cystoscopy Cystoscopy is a procedure that is used to help diagnose and sometimes treat conditions that affect the lower urinary tract. The lower urinary tract includes the bladder and the urethra. The urethra is the tube that drains urine from the bladder. Cystoscopy is done using a thin, tube-shaped instrument with a light and camera at the end (cystoscope). The cystoscope may be hard or flexible, depending on the goal of theprocedure. The cystoscope is inserted through the urethra, into the bladder. Cystoscopy may be recommended if you have: Urinary tract infections that keep coming back. Blood in the urine (hematuria). An inability to control when you urinate (urinary incontinence) or an overactive bladder. Unusual cells found in a urine sample. A blockage in the urethra, such as a urinary stone. Painful urination. An abnormality in the bladder found during an intravenous pyelogram (IVP) or CT scan. Cystoscopy may also be done to remove a sample of tissue to be examined under a microscope (biopsy). Tell a health care provider about: Any allergies you have. All medicines you are taking, including vitamins, herbs, eye drops, creams, and over-the-counter medicines. Any problems you or family members have had with anesthetic medicines. Any blood disorders you have. Any surgeries you have had. Any medical conditions you have. Whether you are pregnant or may be pregnant. What are the risks? Generally, this is a safe procedure. However, problems may occur, including: Infection. Bleeding. Allergic reactions to medicines. Damage to other structures or organs. What happens before the procedure? Medicines Ask your health care provider about: Changing or stopping your regular medicines. This is especially important if you are taking diabetes medicines or blood thinners. Taking medicines such as aspirin and ibuprofen. These medicines can thin your blood. Do not take these medicines unless your  health care provider tells you to take them. Taking over-the-counter medicines, vitamins, herbs, and supplements. Tests You may have an exam or testing, such as: X-rays of the bladder, urethra, or kidneys. CT scan of the abdomen or pelvis. Urine tests to check for signs of infection. General instructions Follow instructions from your health care provider about eating or drinking restrictions. Ask your health care provider what steps will be taken to help prevent infection. These steps may include: Washing skin with a germ-killing soap. Taking antibiotic medicine. Plan to have a responsible adult take you home from the hospital or clinic. What happens during the procedure?  You will be given one or more of the following: A medicine to help you relax (sedative). A medicine to numb the area (local anesthetic). The area around the opening of your urethra will be cleaned. The cystoscope will be passed through your urethra into your bladder. Germ-free (sterile) fluid will flow through the cystoscope to fill your bladder. The fluid will stretch your bladder so that your health care provider can clearly examine your bladder walls. Your doctor will look at the urethra and bladder. Your doctor may take a biopsy or remove stones. The cystoscope will be removed, and your bladder will be emptied. The procedure may vary among health care providers and hospitals. What can I expect after the procedure? After the procedure, it is common to have: Some soreness or pain in your abdomen and urethra. Urinary symptoms. These include: Mild pain or burning when you urinate. Pain should stop within a few minutes after you urinate. This may last for up to 1 week. A small amount of blood in your urine for several days. Feeling like you need to urinate but producing only a  small amount of urine. Follow these instructions at home: Medicines Take over-the-counter and prescription medicines only as told by your  health care provider. If you were prescribed an antibiotic medicine, take it as told by your health care provider. Do not stop taking the antibiotic even if you start to feel better. General instructions Return to your normal activities as told by your health care provider. Ask your health care provider what activities are safe for you. If you were given a sedative during the procedure, it can affect you for several hours. Do not drive or operate machinery until your health care provider says that it is safe. Watch for any blood in your urine. If the amount of blood in your urine increases, call your health care provider. Follow instructions from your health care provider about eating or drinking restrictions. If a tissue sample was removed for testing (biopsy) during your procedure, it is up to you to get your test results. Ask your health care provider, or the department that is doing the test, when your results will be ready. Drink enough fluid to keep your urine pale yellow. Keep all follow-up visits. This is important. Contact a health care provider if: You have pain that gets worse or does not get better with medicine, especially pain when you urinate. You have trouble urinating. You have more blood in your urine. Get help right away if: You have blood clots in your urine. You have abdominal pain. You have a fever or chills. You are unable to urinate. Summary Cystoscopy is a procedure that is used to help diagnose and sometimes treat conditions that affect the lower urinary tract. Cystoscopy is done using a thin, tube-shaped instrument with a light and camera at the end. After the procedure, it is common to have some soreness or pain in your abdomen and urethra. Watch for any blood in your urine. If the amount of blood in your urine increases, call your health care provider. If you were prescribed an antibiotic medicine, take it as told by your health care provider. Do not stop taking  the antibiotic even if you start to feel better. This information is not intended to replace advice given to you by your health care provider. Make sure you discuss any questions you have with your healthcare provider. Document Revised: 11/16/2019 Document Reviewed: 11/16/2019 Elsevier Patient Education  [place].

## 2020-11-18 NOTE — Telephone Encounter (Signed)
Called pt informed him of the information below. Pt voiced understanding. Cysto scheduled.

## 2020-11-18 NOTE — Progress Notes (Signed)
   11/18/2020 10:17 AM   [name] 1945-05-05 [id number]  Reason for visit: History of prostate cancer, kidney cancer, split urinary stream  HPI: 75 year old healthy male with a history of an open radical prostatectomy about 20 years ago, with undetectable PSA since that time, as well as an open right radical nephrectomy approximately 10 years ago for kidney cancer.  Those records are unavailable to me(Dr. Sherlyn Lick at [facility] urological).  He has opted to discontinue PSA surveillance now that he has over 20 years out from his prostate cancer which is very reasonable per the AUA guidelines.  Regarding his history of RCC, we reviewed the AUA guidelines discussing surveillance, and he opts to continue a yearly renal ultrasound.  I personally viewed and interpreted the renal ultrasound from August 2021 that shows a prior right nephrectomy, and no solid left renal masses or hydronephrosis.  Renal ultrasound has not been completed yet this year.  He also reports a few months of some spraying and split urinary stream.  He has mild stress incontinence since his open prostatectomy.  He denies any dysuria or gross hematuria.  On exam he has an uncircumcised phallus with patent meatus, no penile lesions.  We discussed possible etiologies of his urinary symptoms including bladder neck contracture, urethral stricture, or other bladder pathology.  -Urinalysis and culture today for evaluation of urinary symptoms, consider cystoscopy pending UA results -Renal ultrasound ordered for history of [place], [place] [address], [place], [place] [address] [phone]

## 2020-11-20 LAB — URINE CULTURE: Culture: NO GROWTH

## 2020-11-25 ENCOUNTER — Other Ambulatory Visit: Payer: Self-pay | Admitting: Urology

## 2020-11-26 ENCOUNTER — Encounter: Payer: Self-pay | Admitting: Urology

## 2020-11-26 ENCOUNTER — Ambulatory Visit: Payer: Medicare PPO | Admitting: Urology

## 2020-11-26 ENCOUNTER — Other Ambulatory Visit: Payer: Self-pay

## 2020-11-26 VITALS — BP 132/83 | HR 57 | Ht 69.0 in | Wt 190.0 lb

## 2020-11-26 DIAGNOSIS — R399 Unspecified symptoms and signs involving the genitourinary system: Secondary | ICD-10-CM | POA: Diagnosis not present

## 2020-11-26 NOTE — Patient Instructions (Signed)
Will call with results

## 2020-11-26 NOTE — Progress Notes (Signed)
Cystoscopy Procedure Note:  Indication: Split and spraying urinary stream  Distant history of open prostatectomy, PSA remains undetectable  After informed consent and discussion of the procedure and its risks, Jason Krause was positioned and prepped in the standard fashion. Cystoscopy was performed with a flexible cystoscope.  The urethra was grossly normal.  Prostate was surgically absent.  Excellent coaptation of the sphincter on demand.  No evidence of bladder neck contracture or stricture.  Bladder grossly normal throughout, no abnormalities on retroflexion.  Imaging: Renal/bladder ultrasound scheduled for tomorrow, will call with results  Findings: Normal cystoscopy, prostate surgically absent  Assessment and Plan: Follow-up in 1 year with renal/bladder ultrasound prior for history of RCC  Nickolas Madrid, MD 11/26/2020

## 2020-11-27 ENCOUNTER — Ambulatory Visit
Admission: RE | Admit: 2020-11-27 | Discharge: 2020-11-27 | Disposition: A | Discharge status: Home / Self Care | Payer: Medicare PPO | Source: Ambulatory Visit | Attending: Urology | Admitting: Urology

## 2020-11-27 DIAGNOSIS — Z905 Acquired absence of kidney: Secondary | ICD-10-CM | POA: Diagnosis not present

## 2020-11-27 DIAGNOSIS — C61 Malignant neoplasm of prostate: Secondary | ICD-10-CM | POA: Diagnosis not present

## 2020-11-27 DIAGNOSIS — N281 Cyst of kidney, acquired: Secondary | ICD-10-CM | POA: Diagnosis not present

## 2020-11-27 DIAGNOSIS — Z85528 Personal history of other malignant neoplasm of kidney: Secondary | ICD-10-CM | POA: Diagnosis not present

## 2020-11-27 DIAGNOSIS — C641 Malignant neoplasm of right kidney, except renal pelvis: Secondary | ICD-10-CM | POA: Insufficient documentation

## 2020-12-01 ENCOUNTER — Telehealth: Payer: Self-pay

## 2020-12-01 NOTE — Telephone Encounter (Signed)
Called pt no answer, unable to leave VM per DPR. LM for pt to call back. 1st attempt.

## 2020-12-01 NOTE — Telephone Encounter (Signed)
-----   Message from Billey Co, MD sent at 12/01/2020  1:01 PM EDT ----- Kidney ultrasound normal, keep 1 year follow-up as scheduled

## 2020-12-02 NOTE — Telephone Encounter (Signed)
Pt returns call, informed him of the information below. Pt gave verbal understanding.  

## 2020-12-04 DIAGNOSIS — R29898 Other symptoms and signs involving the musculoskeletal system: Secondary | ICD-10-CM | POA: Diagnosis not present

## 2020-12-04 DIAGNOSIS — G252 Other specified forms of tremor: Secondary | ICD-10-CM | POA: Insufficient documentation

## 2020-12-04 DIAGNOSIS — M25562 Pain in left knee: Secondary | ICD-10-CM | POA: Diagnosis not present

## 2020-12-04 DIAGNOSIS — M25561 Pain in right knee: Secondary | ICD-10-CM | POA: Diagnosis not present

## 2020-12-04 DIAGNOSIS — G250 Essential tremor: Secondary | ICD-10-CM | POA: Diagnosis not present

## 2020-12-04 DIAGNOSIS — R278 Other lack of coordination: Secondary | ICD-10-CM | POA: Diagnosis not present

## 2020-12-10 DIAGNOSIS — N183 Chronic kidney disease, stage 3 unspecified: Secondary | ICD-10-CM | POA: Diagnosis not present

## 2020-12-10 DIAGNOSIS — C649 Malignant neoplasm of unspecified kidney, except renal pelvis: Secondary | ICD-10-CM | POA: Diagnosis not present

## 2020-12-10 DIAGNOSIS — I10 Essential (primary) hypertension: Secondary | ICD-10-CM | POA: Diagnosis not present

## 2020-12-14 DIAGNOSIS — G4733 Obstructive sleep apnea (adult) (pediatric): Secondary | ICD-10-CM | POA: Diagnosis not present

## 2021-01-08 ENCOUNTER — Other Ambulatory Visit: Payer: Self-pay | Admitting: *Deleted

## 2021-01-08 ENCOUNTER — Telehealth: Payer: Self-pay | Admitting: *Deleted

## 2021-01-08 ENCOUNTER — Other Ambulatory Visit: Payer: Self-pay | Admitting: Family Medicine

## 2021-01-08 DIAGNOSIS — I10 Essential (primary) hypertension: Secondary | ICD-10-CM

## 2021-01-08 DIAGNOSIS — E782 Mixed hyperlipidemia: Secondary | ICD-10-CM

## 2021-01-08 MED ORDER — ROSUVASTATIN CALCIUM 10 MG PO TABS
ORAL_TABLET | ORAL | 0 refills | Status: DC
Start: 2021-01-08 — End: 2021-01-09

## 2021-01-09 ENCOUNTER — Encounter: Payer: Self-pay | Admitting: Family Medicine

## 2021-01-09 ENCOUNTER — Ambulatory Visit: Payer: Medicare PPO | Admitting: Family Medicine

## 2021-01-09 ENCOUNTER — Other Ambulatory Visit: Payer: Self-pay

## 2021-01-09 VITALS — BP 110/80 | HR 60 | Ht 69.0 in | Wt 195.0 lb

## 2021-01-09 DIAGNOSIS — E782 Mixed hyperlipidemia: Secondary | ICD-10-CM | POA: Diagnosis not present

## 2021-01-09 DIAGNOSIS — I10 Essential (primary) hypertension: Secondary | ICD-10-CM

## 2021-01-09 MED ORDER — PROPRANOLOL HCL ER 60 MG PO CP24: 180.0000 mg | ORAL_CAPSULE | Freq: Every day | ORAL | 1 refills | Status: DC

## 2021-01-09 MED ORDER — ROSUVASTATIN CALCIUM 10 MG PO TABS: ORAL_TABLET | ORAL | 1 refills | Status: DC

## 2021-01-09 MED ORDER — LISINOPRIL 20 MG PO TABS: 20.0000 mg | ORAL_TABLET | Freq: Every day | ORAL | 1 refills | Status: DC

## 2021-01-09 NOTE — Progress Notes (Signed)
Date:  01/09/2021   Name:  Jason Krause   DOB:  [birth year]   MRN:  [id number]   Chief Complaint: Hypertension and Hyperlipidemia  Hypertension This is a chronic problem. The current episode started more than 1 year ago. The problem has been gradually improving since onset. The problem is controlled. Pertinent negatives include no anxiety, blurred vision, chest pain, headaches, malaise/fatigue, neck pain, orthopnea, palpitations, peripheral edema, PND, shortness of breath or sweats. There are no associated agents to hypertension. Risk factors for coronary artery disease include dyslipidemia and male gender. Past treatments include beta blockers and ACE inhibitors. The current treatment provides moderate improvement. There are no compliance problems.  There is no history of angina, kidney disease, CAD/MI, CVA, heart failure, left ventricular hypertrophy, PVD or retinopathy. There is no history of chronic renal disease, a hypertension causing med or renovascular disease.  Hyperlipidemia This is a chronic problem. The current episode started more than 1 month ago. The problem is controlled. Recent lipid tests were reviewed and are normal. He has no history of chronic renal disease, diabetes, hypothyroidism, liver disease or obesity. There are no known factors aggravating his hyperlipidemia. Pertinent negatives include no chest pain, focal sensory loss, focal weakness, leg pain, myalgias or shortness of breath. Current antihyperlipidemic treatment includes statins. The current treatment provides moderate improvement of lipids. There are no compliance problems.    Lab Results  Component Value Date   CREATININE 1.44 (H) 10/23/2020   BUN 17 10/23/2020   NA 144 10/23/2020   K 4.7 10/23/2020   CL 107 (H) 10/23/2020   CO2 25 10/23/2020   Lab Results  Component Value Date   CHOL 150 10/23/2020   HDL 42 10/23/2020   LDLCALC 94 10/23/2020   TRIG 69 10/23/2020   CHOLHDL 3.6 11/06/2019   No results  found for: TSH No results found for: HGBA1C No results found for: WBC, HGB, HCT, MCV, PLT Lab Results  Component Value Date   ALT 18 10/23/2020   AST 22 10/23/2020   ALKPHOS 89 10/23/2020   BILITOT 0.9 10/23/2020     Review of Systems  Constitutional:  Negative for chills, fever and malaise/fatigue.  HENT:  Negative for drooling, ear discharge, ear pain and sore throat.   Eyes:  Negative for blurred vision.  Respiratory:  Negative for cough, shortness of breath and wheezing.   Cardiovascular:  Negative for chest pain, palpitations, orthopnea, leg swelling and PND.  Gastrointestinal:  Negative for abdominal pain, blood in stool, constipation, diarrhea and nausea.  Endocrine: Negative for polydipsia.  Genitourinary:  Negative for dysuria, frequency, hematuria and urgency.  Musculoskeletal:  Negative for back pain, myalgias and neck pain.  Skin:  Negative for rash.  Allergic/Immunologic: Negative for environmental allergies.  Neurological:  Negative for dizziness, focal weakness and headaches.  Hematological:  Does not bruise/bleed easily.  Psychiatric/Behavioral:  Negative for suicidal ideas. The patient is not nervous/anxious.    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[place]) 11/08/2016   Personal history of renal cell carcinoma 11/08/2016  Cerebrovascular accident [facility]) 10/18/2016   History of kidney cancer 01/19/2007   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[place]) 01/12/2001    Allergies  Allergen Reactions   Carvedilol Other (See Comments)     "makes me a zombie"    Past Surgical History:  Procedure Laterality Date   COLONOSCOPY WITH PROPOFOL N/A 12/06/2019   Procedure: COLONOSCOPY and biopsy WITH PROPOFOL;  Surgeon: Lucilla Lame, MD;  Location: [facility];  Service: Endoscopy;  Laterality: N/A;  priority 4   HERNIA REPAIR     kidney removal due to cancer Right    POLYPECTOMY N/A 12/06/2019   Procedure: POLYPECTOMY;  Surgeon: Lucilla Lame, MD;  Location: [facility];  Service: Endoscopy;  Laterality: N/A;   PROSTATE SURGERY     removed    Social History   Tobacco Use   Smoking status: Never   Smokeless tobacco: Never  Vaping Use   Vaping Use: Never used  Substance Use Topics   Alcohol use: Yes    Comment: social   Drug use: Never     Medication list has been reviewed and updated.  Current Meds  Medication Sig   Coenzyme Q10 100 MG capsule Take 1 capsule by mouth daily.   folic acid (FOLVITE) 637 MCG tablet Take 1 tablet by mouth daily.   gabapentin (NEURONTIN) 100 MG capsule Take 2 capsules by mouth 2 (two) times daily. potter   Glucos-Chondroit-Hyaluron-MSM (GLUCOSAMINE CHONDROITIN JOINT PO) Take 2 tablets by mouth daily.   lisinopril (ZESTRIL) 20 MG tablet Take 1 tablet (20 mg total) by mouth daily.   Multiple Vitamins-Minerals (CENTRUM SILVER PO) Take 1 tablet by mouth daily.   propranolol ER (INDERAL LA) 60 MG 24 hr capsule Take 3 capsules (180 mg total) by mouth daily. 2 capsules in am and 1 in the pm   rosuvastatin (CRESTOR) 10 MG tablet TAKE (1) TABLET BY MOUTH EVERY DAY   [DISCONTINUED] carbidopa-levodopa (SINEMET IR) 25-100 MG tablet Take 1 tablet by mouth 3 (three) times daily.    PHQ 2/9 Scores 01/09/2021 11/06/2020 10/23/2020 07/18/2020  PHQ - 2 Score 0 0 0 0  PHQ- 9 Score 0 0 0 0    GAD 7 : Generalized Anxiety Score 01/09/2021 11/06/2020 10/23/2020 07/18/2020  Nervous, Anxious, on Edge 0 0 0 0  Control/stop worrying 0 0 0 0  Worry too much - different things 0 0 0 0  Trouble relaxing  0 0 0 0  Restless 0 0 0 0  Easily annoyed or irritable 0 0 0 0  Afraid - awful might happen 0 0 0 0  Total GAD 7 Score 0 0 0 0    BP Readings from Last 3 Encounters:  01/09/21 110/80  11/26/20 132/83  11/18/20 118/82    Physical Exam Vitals and nursing note reviewed.  HENT:     Head: Normocephalic.     Right Ear: Tympanic membrane, ear canal and external ear normal. There is no impacted cerumen.     Left Ear: Tympanic membrane, ear canal and external ear normal. There is no impacted cerumen.     Nose: Nose normal. No congestion or rhinorrhea.  Eyes:     General: No scleral icterus.       Right eye: No discharge.        Left eye: No discharge.     Conjunctiva/sclera: Conjunctivae normal.     Pupils: Pupils are equal, round, and reactive to light.  Neck:     Thyroid: No thyromegaly.  Vascular: No JVD.     Trachea: No tracheal deviation.  Cardiovascular:     Rate and Rhythm: Normal rate and regular rhythm.     Heart sounds: Normal heart sounds. No murmur heard.   No friction rub. No gallop.  Pulmonary:     Effort: No respiratory distress.     Breath sounds: Normal breath sounds. No wheezing, rhonchi or rales.  Abdominal:     General: Bowel sounds are normal.     Palpations: Abdomen is soft. There is no mass.     Tenderness: There is no abdominal tenderness. There is no guarding or rebound.  Musculoskeletal:        General: No tenderness. Normal range of motion.     Cervical back: Normal range of motion and neck supple.  Lymphadenopathy:     Cervical: No cervical adenopathy.  Skin:    General: Skin is warm.     Findings: No rash.  Neurological:     Mental Status: He is alert and oriented to person, place, and time.     Cranial Nerves: No cranial nerve deficit.     Deep Tendon Reflexes: Reflexes are normal and symmetric.    Wt Readings from Last 3 Encounters:  01/09/21 195 lb (88.5 kg)  11/26/20 190 lb (86.2 kg)  11/18/20 191 lb 3.2 oz (86.7 kg)    BP 110/80    Pulse 60   Ht 5\' 9"  (1.753 m)   Wt 195 lb (88.5 kg)   BMI 28.80 kg/m   Assessment and Plan:   1. Essential hypertension Chronic.  Controlled.  Stable.  Blood pressure 110/80.  Continue lisinopril 20 mg once a day and propranolol ER 60 mg 2 in the morning and 1 in the evening. - lisinopril (ZESTRIL) 20 MG tablet; Take 1 tablet (20 mg total) by mouth daily.  Dispense: 90 tablet; Refill: 1 - propranolol ER (INDERAL LA) 60 MG 24 hr capsule; Take 3 capsules (180 mg total) by mouth daily. 2 capsules in am and 1 in the pm  Dispense: 270 capsule; Refill: 1  2. Mixed hyperlipidemia Chronic.  Controlled.  Stable.  Continue rosuvastatin 10 mg once a day. - rosuvastatin (CRESTOR) 10 MG tablet; TAKE (1) TABLET BY MOUTH EVERY DAY  Dispense: 90 tablet; Refill: 1   Patient will be calling in late December or early January for further lab work including renal panel and lipid panel.

## 2021-01-10 NOTE — Telephone Encounter (Signed)
In error

## 2021-01-12 DIAGNOSIS — R29898 Other symptoms and signs involving the musculoskeletal system: Secondary | ICD-10-CM | POA: Diagnosis not present

## 2021-01-12 DIAGNOSIS — G629 Polyneuropathy, unspecified: Secondary | ICD-10-CM | POA: Diagnosis not present

## 2021-01-12 DIAGNOSIS — R251 Tremor, unspecified: Secondary | ICD-10-CM | POA: Diagnosis not present

## 2021-01-14 DIAGNOSIS — G4733 Obstructive sleep apnea (adult) (pediatric): Secondary | ICD-10-CM | POA: Diagnosis not present

## 2021-01-27 DIAGNOSIS — G4733 Obstructive sleep apnea (adult) (pediatric): Secondary | ICD-10-CM | POA: Diagnosis not present

## 2021-02-10 ENCOUNTER — Ambulatory Visit: Payer: Medicare PPO | Admitting: Family Medicine

## 2021-02-10 ENCOUNTER — Encounter: Payer: Self-pay | Admitting: Family Medicine

## 2021-02-10 ENCOUNTER — Other Ambulatory Visit: Payer: Self-pay

## 2021-02-10 VITALS — BP 120/64 | HR 84 | Ht 69.0 in | Wt 194.0 lb

## 2021-02-10 DIAGNOSIS — R051 Acute cough: Secondary | ICD-10-CM | POA: Diagnosis not present

## 2021-02-10 DIAGNOSIS — J0100 Acute maxillary sinusitis, unspecified: Secondary | ICD-10-CM

## 2021-02-10 MED ORDER — AMOXICILLIN 500 MG PO CAPS: 500.0000 mg | ORAL_CAPSULE | Freq: Three times a day (TID) | ORAL | 0 refills | Status: AC

## 2021-02-10 MED ORDER — GUAIFENESIN-CODEINE 100-10 MG/5ML PO SYRP
5.0000 mL | ORAL_SOLUTION | Freq: Three times a day (TID) | ORAL | 0 refills | Status: DC | PRN
Start: 2021-02-10 — End: 2021-03-30

## 2021-02-10 NOTE — Progress Notes (Signed)
Date:  02/10/2021   Name:  Jason Krause   DOB:  01-25-1946   MRN:  [id number]   Chief Complaint: Sore Throat (cough)  Sore Throat  This is a new problem. The current episode started yesterday. The problem has been gradually improving. There has been no fever. The pain is moderate. Associated symptoms include congestion, coughing and a hoarse voice. Pertinent negatives include no abdominal pain, diarrhea, drooling, ear discharge, ear pain, headaches, plugged ear sensation, neck pain or shortness of breath. The treatment provided moderate relief.   Lab Results  Component Value Date   CREATININE 1.44 (H) 10/23/2020   BUN 17 10/23/2020   NA 144 10/23/2020   K 4.7 10/23/2020   CL 107 (H) 10/23/2020   CO2 25 10/23/2020   Lab Results  Component Value Date   CHOL 150 10/23/2020   HDL 42 10/23/2020   LDLCALC 94 10/23/2020   TRIG 69 10/23/2020   CHOLHDL 3.6 11/06/2019   No results found for: TSH No results found for: HGBA1C No results found for: WBC, HGB, HCT, MCV, PLT Lab Results  Component Value Date   ALT 18 10/23/2020   AST 22 10/23/2020   ALKPHOS 89 10/23/2020   BILITOT 0.9 10/23/2020     Review of Systems  Constitutional:  Negative for chills and fever.  HENT:  Positive for congestion and hoarse voice. Negative for drooling, ear discharge, ear pain and sore throat.   Respiratory:  Positive for cough. Negative for shortness of breath and wheezing.   Cardiovascular:  Negative for chest pain, palpitations and leg swelling.  Gastrointestinal:  Negative for abdominal pain, blood in stool, constipation, diarrhea and nausea.  Endocrine: Negative for polydipsia.  Genitourinary:  Negative for dysuria, frequency, hematuria and urgency.  Musculoskeletal:  Negative for back pain, myalgias and neck pain.  Skin:  Negative for rash.  Allergic/Immunologic: Negative for environmental allergies.  Neurological:  Negative for dizziness and headaches.  Hematological:  Does not  bruise/bleed easily.  Psychiatric/Behavioral:  Negative for suicidal ideas. The patient is not nervous/anxious.    Patient Active Problem List   Diagnosis Date Noted   Leg pain 06/22/2020   Personal history of colonic polyps    Polyp of descending colon    CKD (chronic kidney disease) stage 3, GFR 30-59 ml/min ([place]) 12/28/2017   Osteopenia 12/28/2017   Polyp of gallbladder 12/28/2017   Right bundle branch block 12/28/2017   SAH (subarachnoid hemorrhage) ([place]) 11/11/2016   Essential hypertension 11/09/2016   Mixed hyperlipidemia 11/08/2016   Nontraumatic cortical hemorrhage of left cerebral hemisphere ([place]) 11/08/2016   Personal history of renal cell carcinoma 11/08/2016   Cerebrovascular accident ([place]) 10/18/2016   History of kidney cancer 01/19/2007   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[place]) 01/12/2001    Allergies  Allergen Reactions   Carvedilol Other (See Comments)    "makes me a zombie"    Past Surgical History:  Procedure Laterality Date   COLONOSCOPY WITH PROPOFOL N/A 12/06/2019   Procedure: COLONOSCOPY and biopsy WITH PROPOFOL;  Surgeon: Lucilla Lame, MD;  Location: [place];  Service: Endoscopy;  Laterality: N/A;  priority 4   HERNIA REPAIR     kidney removal due to cancer Right    POLYPECTOMY N/A 12/06/2019   Procedure: POLYPECTOMY;  Surgeon: Lucilla Lame, MD;  Location: [facility];  Service: Endoscopy;  Laterality: N/A;   PROSTATE SURGERY     removed    Social History   Tobacco Use   Smoking status: Never  Smokeless tobacco: Never  Vaping Use   Vaping Use: Never used  Substance Use Topics   Alcohol use: Yes    Comment: social   Drug use: Never     Medication list has been reviewed and updated.  Current Meds  Medication Sig   Coenzyme Q10 100 MG capsule Take 1 capsule by mouth daily.   folic acid (FOLVITE) 694 MCG tablet Take 1 tablet by mouth daily.   gabapentin (NEURONTIN) 100 MG capsule Take 2 capsules by mouth 2 (two) times daily. potter    Glucos-Chondroit-Hyaluron-MSM (GLUCOSAMINE CHONDROITIN JOINT PO) Take 2 tablets by mouth daily.   lisinopril (ZESTRIL) 20 MG tablet Take 1 tablet (20 mg total) by mouth daily.   Multiple Vitamins-Minerals (CENTRUM SILVER PO) Take 1 tablet by mouth daily.   propranolol ER (INDERAL LA) 60 MG 24 hr capsule Take 3 capsules (180 mg total) by mouth daily. 2 capsules in am and 1 in the pm   rosuvastatin (CRESTOR) 10 MG tablet TAKE (1) TABLET BY MOUTH EVERY DAY    PHQ 2/9 Scores 01/09/2021 11/06/2020 10/23/2020 07/18/2020  PHQ - 2 Score 0 0 0 0  PHQ- 9 Score 0 0 0 0    GAD 7 : Generalized Anxiety Score 01/09/2021 11/06/2020 10/23/2020 07/18/2020  Nervous, Anxious, on Edge 0 0 0 0  Control/stop worrying 0 0 0 0  Worry too much - different things 0 0 0 0  Trouble relaxing 0 0 0 0  Restless 0 0 0 0  Easily annoyed or irritable 0 0 0 0  Afraid - awful might happen 0 0 0 0  Total GAD 7 Score 0 0 0 0    BP Readings from Last 3 Encounters:  02/10/21 120/64  01/09/21 110/80  11/26/20 132/83    Physical Exam Vitals and nursing note reviewed.  HENT:     Head: Normocephalic.     Right Ear: Tympanic membrane, ear canal and external ear normal.     Left Ear: Tympanic membrane, ear canal and external ear normal.     Nose: Nose normal.     Mouth/Throat:     Mouth: Mucous membranes are moist.     Pharynx: Posterior oropharyngeal erythema present.     Tonsils: No tonsillar exudate.  Eyes:     General: No scleral icterus.       Right eye: No discharge.        Left eye: No discharge.     Conjunctiva/sclera: Conjunctivae normal.     Pupils: Pupils are equal, round, and reactive to light.  Neck:     Thyroid: No thyromegaly.     Vascular: No JVD.     Trachea: No tracheal deviation.  Cardiovascular:     Rate and Rhythm: Normal rate and regular rhythm.     Heart sounds: Normal heart sounds. No murmur heard.   No friction rub. No gallop.  Pulmonary:     Effort: No respiratory distress.     Breath  sounds: Normal breath sounds. No wheezing or rales.  Abdominal:     General: Bowel sounds are normal.     Palpations: Abdomen is soft. There is no mass.     Tenderness: There is no abdominal tenderness.  Musculoskeletal:        General: No tenderness. Normal range of motion.     Cervical back: Normal range of motion and neck supple.  Lymphadenopathy:     Cervical: No cervical adenopathy.  Skin:    General: Skin is  warm.     Findings: No rash.  Neurological:     Mental Status: He is alert and oriented to person, place, and time.     Cranial Nerves: No cranial nerve deficit.     Deep Tendon Reflexes: Reflexes are normal and symmetric.    Wt Readings from Last 3 Encounters:  02/10/21 194 lb (88 kg)  01/09/21 195 lb (88.5 kg)  11/26/20 190 lb (86.2 kg)    BP 120/64   Pulse 84   Ht 5\' 9"  (1.753 m)   Wt 194 lb (88 kg)   BMI 28.65 kg/m   Assessment and Plan:  1. Acute maxillary sinusitis, recurrence not specified New onset.  Persistent.  Mild tenderness over the maxillary sinuses with erythematous throat and palpable tender cervical lymph nodes consistent with maxillary sinusitis and will treat with amoxicillin 500 mg 3 times a day for 10 days. - amoxicillin (AMOXIL) 500 MG capsule; Take 1 capsule (500 mg total) by mouth 3 (three) times daily for 10 days.  Dispense: 30 capsule; Refill: 0  2. Acute cough Acute cough which is nocturnal and is of need of control.  We will treat with Robitussin-AC a teaspoon 3 times a day as needed for cough. - guaiFENesin-codeine (ROBITUSSIN AC) 100-10 MG/5ML syrup; Take 5 mLs by mouth 3 (three) times daily as needed for cough.  Dispense: 118 mL; Refill: 0

## 2021-02-11 DIAGNOSIS — L578 Other skin changes due to chronic exposure to nonionizing radiation: Secondary | ICD-10-CM | POA: Diagnosis not present

## 2021-02-11 DIAGNOSIS — Z85828 Personal history of other malignant neoplasm of skin: Secondary | ICD-10-CM | POA: Diagnosis not present

## 2021-02-11 DIAGNOSIS — Z872 Personal history of diseases of the skin and subcutaneous tissue: Secondary | ICD-10-CM | POA: Diagnosis not present

## 2021-02-11 DIAGNOSIS — L570 Actinic keratosis: Secondary | ICD-10-CM | POA: Diagnosis not present

## 2021-02-11 DIAGNOSIS — Z85820 Personal history of malignant melanoma of skin: Secondary | ICD-10-CM | POA: Diagnosis not present

## 2021-02-13 DIAGNOSIS — G4733 Obstructive sleep apnea (adult) (pediatric): Secondary | ICD-10-CM | POA: Diagnosis not present

## 2021-02-27 DIAGNOSIS — G4733 Obstructive sleep apnea (adult) (pediatric): Secondary | ICD-10-CM | POA: Diagnosis not present

## 2021-03-02 DIAGNOSIS — H01009 Unspecified blepharitis unspecified eye, unspecified eyelid: Secondary | ICD-10-CM | POA: Diagnosis not present

## 2021-03-02 DIAGNOSIS — H2513 Age-related nuclear cataract, bilateral: Secondary | ICD-10-CM | POA: Diagnosis not present

## 2021-03-02 DIAGNOSIS — H02839 Dermatochalasis of unspecified eye, unspecified eyelid: Secondary | ICD-10-CM | POA: Diagnosis not present

## 2021-03-25 ENCOUNTER — Telehealth: Payer: Self-pay

## 2021-03-25 NOTE — Telephone Encounter (Signed)
Copied from [place] [id number]. Topic: General - Other >> Mar 25, 2021 10:43 AM Erick Blinks wrote: Reason for CRM: Pt is requesting renal panel lab work, wants to pick up the order after his visit Monday. Wants full panel lab work as well he says.

## 2021-03-30 ENCOUNTER — Ambulatory Visit (INDEPENDENT_AMBULATORY_CARE_PROVIDER_SITE_OTHER): Payer: Medicare PPO

## 2021-03-30 DIAGNOSIS — Z0000 Encounter for general adult medical examination without abnormal findings: Secondary | ICD-10-CM | POA: Diagnosis not present

## 2021-03-30 NOTE — Progress Notes (Signed)
Subjective:   Jason Krause is a 75 y.o. male who presents for Medicare Annual/Subsequent preventive examination.  Virtual Visit via Telephone Note  I connected with  Jason Krause on 03/30/21 at  8:00 AM EST by telephone and verified that I am speaking with the correct person using two identifiers.  Location: Patient: home Provider: [facility] Persons participating in the virtual visit: [place]   I discussed the limitations, risks, security and privacy concerns of performing an evaluation and management service by telephone and the availability of in person appointments. The patient expressed understanding and agreed to proceed.  Interactive audio and video telecommunications were attempted between this nurse and patient, however failed, due to patient having technical difficulties OR patient did not have access to video capability.  We continued and completed visit with audio only.  Some vital signs may be absent or patient reported.   Clemetine Marker, LPN   Review of Systems     Cardiac Risk Factors include: advanced age (>41men, >65 women);dyslipidemia;male gender;hypertension     Objective:    There were no vitals filed for this visit. There is no height or weight on file to calculate BMI.  Advanced Directives 03/30/2021 03/26/2020 12/06/2019  Does Patient Have a Medical Advance Directive? No No No  Would patient like information on creating a medical advance directive? Yes (MAU/Ambulatory/Procedural Areas - Information given) No - Patient declined No - Patient declined    Current Medications (verified) Outpatient Encounter Medications as of 03/30/2021  Medication Sig   Coenzyme Q10 100 MG capsule Take 1 capsule by mouth daily.   folic acid (FOLVITE) 086 MCG tablet Take 1 tablet by mouth daily.   gabapentin (NEURONTIN) 100 MG capsule Take 2 capsules by mouth 2 (two) times daily. potter   Glucos-Chondroit-Hyaluron-MSM (GLUCOSAMINE CHONDROITIN JOINT PO) Take 2  tablets by mouth daily.   lisinopril (ZESTRIL) 20 MG tablet Take 1 tablet (20 mg total) by mouth daily.   Multiple Vitamins-Minerals (CENTRUM SILVER PO) Take 1 tablet by mouth daily.   propranolol ER (INDERAL LA) 60 MG 24 hr capsule Take 3 capsules (180 mg total) by mouth daily. 2 capsules in am and 1 in the pm   rosuvastatin (CRESTOR) 10 MG tablet TAKE (1) TABLET BY MOUTH EVERY DAY   [DISCONTINUED] guaiFENesin-codeine (ROBITUSSIN AC) 100-10 MG/5ML syrup Take 5 mLs by mouth 3 (three) times daily as needed for cough.   No facility-administered encounter medications on file as of 03/30/2021.    Allergies (verified) Carvedilol   History: Past Medical History:  Diagnosis Date   Cancer ([place])    kidney and prostate   Chronic kidney disease    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   Hyperlipidemia    Hypertension    Prostate cancer ([place])    Stroke ([place])    slight weakness in right foot   Past Surgical History:  Procedure Laterality Date   COLONOSCOPY WITH PROPOFOL N/A 12/06/2019   Procedure: COLONOSCOPY and biopsy WITH PROPOFOL;  Surgeon: Lucilla Lame, MD;  Location: [facility];  Service: Endoscopy;  Laterality: N/A;  priority 4   HERNIA REPAIR     kidney removal due to cancer Right    POLYPECTOMY N/A 12/06/2019   Procedure: POLYPECTOMY;  Surgeon: Lucilla Lame, MD;  Location: [place] [facility];  Service: Endoscopy;  Laterality: N/A;   PROSTATE SURGERY     removed   Family History  Problem Relation Age of Onset   Cancer Father  Heart disease Father    Diabetes Sister    Stroke Maternal Grandmother    Heart disease Paternal Grandmother    Social History   Socioeconomic History   Marital status: Married    Spouse name: Syed Zukas   Number of children: 0   Years of education: Not on file   Highest education level: Not on file  Occupational History   Not on file  Tobacco Use   Smoking status: Never   Smokeless tobacco: Never  Vaping Use   Vaping Use:  Never used  Substance and Sexual Activity   Alcohol use: Yes    Comment: social   Drug use: Never   Sexual activity: Not Currently  Other Topics Concern   Not on file  Social History Narrative   Not on file   Social Determinants of Health   Financial Resource Strain: Low Risk    Difficulty of Paying Living Expenses: Not hard at all  Food Insecurity: No Food Insecurity   Worried About Charity fundraiser in the Last Year: Never true   [place] in the Last Year: Never true  Transportation Needs: No Transportation Needs   Lack of Transportation (Medical): No   Lack of Transportation (Non-Medical): No  Physical Activity: Sufficiently Active   Days of Exercise per Week: 5 days   Minutes of Exercise per Session: 30 min  Stress: No Stress Concern Present   Feeling of Stress : Not at all  Social Connections: Moderately Integrated   Frequency of Communication with Friends and Family: More than three times a week   Frequency of Social Gatherings with Friends and Family: Three times a week   Attends Religious Services: More than 4 times per year   Active Member of Clubs or Organizations: No   Attends Archivist Meetings: Never   Marital Status: Married    Tobacco Counseling Counseling given: Not Answered   Clinical Intake:  Pre-visit preparation completed: Yes  Pain : No/denies pain     Nutritional Risks: None Diabetes: No  How often do you need to have someone help you when you read instructions, pamphlets, or other written materials from your doctor or pharmacy?: 1 - Never    Interpreter Needed?: No  Information entered by :: Clemetine Marker LPN   Activities of Daily Living In your present state of health, do you have any difficulty performing the following activities: 03/30/2021 07/18/2020  Hearing? Y N  Vision? N N  Difficulty concentrating or making decisions? N N  Walking or climbing stairs? N Y  Comment - a little bit per patient.  BN  Dressing  or bathing? N N  Doing errands, shopping? N N  Preparing Food and eating ? N -  Using the Toilet? N -  In the past six months, have you accidently leaked urine? Y -  Do you have problems with loss of bowel control? N -  Managing your Medications? N -  Managing your Finances? N -  Housekeeping or managing your Housekeeping? N -  Some recent data might be hidden    Patient Care Team: Juline Patch, MD as PCP - General (Family Medicine)  Indicate any recent Medical Services you may have received from other than [facility] providers in the past year (date may be approximate).     Assessment:   This is a routine wellness examination for Kar[place].  Hearing/Vision screen Hearing Screening - Comments:: Pt c/o mild hearing difficulty; does not require hearing  aids  Vision Screening - Comments:: Annual vision screenings done at [facility] issues and exercise activities discussed: Current Exercise Habits: Home exercise routine, Type of exercise: walking, Time (Minutes): 30, Frequency (Times/Week): 5, Weekly Exercise (Minutes/Week): 150, Intensity: Moderate, Exercise limited by: None identified   Goals Addressed   None    Depression Screen PHQ 2/9 Scores 03/30/2021 01/09/2021 11/06/2020 10/23/2020 07/18/2020 04/23/2020 03/26/2020  PHQ - 2 Score 0 0 0 0 0 0 0  PHQ- 9 Score - 0 0 0 0 0 -    Fall Risk Fall Risk  03/30/2021 01/09/2021 11/06/2020 07/18/2020 03/26/2020  Falls in the past year? 0 0 0 0 1  Number falls in past yr: 0 0 0 0 1  Injury with Fall? 0 0 0 0 0  Risk for fall due to : No Fall Risks No Fall Risks No Fall Risks No Fall Risks History of fall(s)  Follow up Falls prevention discussed Falls evaluation completed Falls evaluation completed Falls evaluation completed Falls prevention discussed    FALL RISK PREVENTION PERTAINING TO THE HOME:  Any stairs in or around the home? No  If so, are there any without handrails? No  Home free of loose throw rugs in walkways, pet beds,  electrical cords, etc? Yes  Adequate lighting in your home to reduce risk of falls? Yes   ASSISTIVE DEVICES UTILIZED TO PREVENT FALLS:  Life alert? No  Use of a cane, walker or w/c? No  Grab bars in the bathroom? No  Shower chair or bench in shower? No  Elevated toilet seat or a handicapped toilet? No   TIMED UP AND GO:  Was the test performed? No . Telephonic visit.   Cognitive Function: Normal cognitive status assessed by direct observation by this Nurse Health Advisor. No abnormalities found.          Immunizations Immunization History  Administered Date(s) Administered   Influenza, High Dose Seasonal PF 01/17/2020   Influenza,inj,Quad PF,6+ Mos 02/18/2019   Influenza,inj,quad, With Preservative 12/18/2017   Influenza-Unspecified 04/19/2018   Moderna Sars-Covid-2 Vaccination 05/25/2019, 06/26/2019, 04/02/2020   Pneumococcal Conjugate-13 04/19/2017   Pneumococcal Polysaccharide-23 05/08/2019   Tdap 04/19/2017    TDAP status: Up to date  Flu Vaccine status: Up to date per patient at [facility]  Pneumococcal vaccine status: Up to date  Covid-19 vaccine status: Completed vaccines  Qualifies for Shingles Vaccine? Yes   Zostavax completed No   Shingrix Completed?: No.    Education has been provided regarding the importance of this vaccine. Patient has been advised to call insurance company to determine out of pocket expense if they have not yet received this vaccine. Advised may also receive vaccine at local pharmacy or Health Dept. Verbalized acceptance and understanding.  Screening Tests Health Maintenance  Topic Date Due   Zoster Vaccines- Shingrix (1 of 2) Never done   INFLUENZA VACCINE  11/17/2020   Hepatitis C Screening  10/23/2021 (Originally 11/28/1963)   COLONOSCOPY (Pts 45-46yrs Insurance coverage will need to be confirmed)  12/05/2024   TETANUS/TDAP  04/20/2027   Pneumonia Vaccine 77+ Years old  Completed   HPV VACCINES  Aged Out   COVID-19  Vaccine  Discontinued    Health Maintenance  Health Maintenance Due  Topic Date Due   Zoster Vaccines- Shingrix (1 of 2) Never done   INFLUENZA VACCINE  11/17/2020    Colorectal cancer screening: Type of screening: Colonoscopy. Completed 12/06/19. Repeat every 5 years  Lung Cancer Screening: (Low  Dose CT Chest recommended if Age 53-80 years, 30 pack-year currently smoking OR have quit w/in 15years.) does not qualify.   Additional Screening:  Hepatitis C Screening: does qualify; postponed  Vision Screening: Recommended annual ophthalmology exams for early detection of glaucoma and other disorders of the eye. Is the patient up to date with their annual eye exam?  Yes  Who is the provider or what is the name of the office in which the patient attends annual eye exams? Dr. Ellin Mayhew.   Dental Screening: Recommended annual dental exams for proper oral hygiene  Community Resource Referral / Chronic Care Management: CRR required this visit?  No   CCM required this visit?  No      Plan:     I have personally reviewed and noted the following in the patient's chart:   Medical and social history Use of alcohol, tobacco or illicit drugs  Current medications and supplements including opioid prescriptions. Patient is not currently taking opioid prescriptions. Functional ability and status Nutritional status Physical activity Advanced directives List of other physicians Hospitalizations, surgeries, and ER visits in previous 12 months Vitals Screenings to include cognitive, depression, and falls Referrals and appointments  In addition, I have reviewed and discussed with patient certain preventive protocols, quality metrics, and best practice recommendations. A written personalized care plan for preventive services as well as general preventive health recommendations were provided to patient.     Clemetine Marker, LPN   [id number]   Nurse Notes: none

## 2021-03-30 NOTE — Patient Instructions (Signed)
Jason Krause , Thank you for taking time to come for your Medicare Wellness Visit. I appreciate your ongoing commitment to your health goals. Please review the following plan we discussed and let me know if I can assist you in the future.   Screening recommendations/referrals: Colonoscopy: done 12/06/19. Repeat 11/2022 Recommended yearly ophthalmology/optometry visit for glaucoma screening and checkup Recommended yearly dental visit for hygiene and checkup  Vaccinations: Influenza vaccine: done - we will contact [name] for your vaccine date Pneumococcal vaccine: done 05/08/19 Tdap vaccine: done 2019 Shingles vaccine: Shingrix discussed. Please contact your pharmacy for coverage information.  Covid-19:  done 05/25/19, 06/26/19 & 04/02/20  Advanced directives: Advance directive discussed with you today. I have provided a copy for you to complete at home and have notarized. Once this is complete please bring a copy in to our office so we can scan it into your chart.   Conditions/risks identified: Keep up the great work!  Next appointment: Follow up in one year for your annual wellness visit.   Preventive Care 16 Years and Older, Male Preventive care refers to lifestyle choices and visits with your health care provider that can promote health and wellness. What does preventive care include? A yearly physical exam. This is also called an annual well check. Dental exams once or twice a year. Routine eye exams. Ask your health care provider how often you should have your eyes checked. Personal lifestyle choices, including: Daily care of your teeth and gums. Regular physical activity. Eating a healthy diet. Avoiding tobacco and drug use. Limiting alcohol use. Practicing safe sex. Taking low doses of aspirin every day. Taking vitamin and mineral supplements as recommended by your health care provider. What happens during an annual well check? The services and screenings done by your health care  provider during your annual well check will depend on your age, overall health, lifestyle risk factors, and family history of disease. Counseling  Your health care provider may ask you questions about your: Alcohol use. Tobacco use. Drug use. Emotional well-being. Home and relationship well-being. Sexual activity. Eating habits. History of falls. Memory and ability to understand (cognition). Work and work Statistician. Screening  You may have the following tests or measurements: Height, weight, and BMI. Blood pressure. Lipid and cholesterol levels. These may be checked every 5 years, or more frequently if you are over 59 years old. Skin check. Lung cancer screening. You may have this screening every year starting at age 64 if you have a 30-pack-year history of smoking and currently smoke or have quit within the past 15 years. Fecal occult blood test (FOBT) of the stool. You may have this test every year starting at age 72. Flexible sigmoidoscopy or colonoscopy. You may have a sigmoidoscopy every 5 years or a colonoscopy every 10 years starting at age 65. Prostate cancer screening. Recommendations will vary depending on your family history and other risks. Hepatitis C blood test. Hepatitis B blood test. Sexually transmitted disease (STD) testing. Diabetes screening. This is done by checking your blood sugar (glucose) after you have not eaten for a while (fasting). You may have this done every 1-3 years. Abdominal aortic aneurysm (AAA) screening. You may need this if you are a current or former smoker. Osteoporosis. You may be screened starting at age 42 if you are at high risk. Talk with your health care provider about your test results, treatment options, and if necessary, the need for more tests. Vaccines  Your health care provider may recommend certain vaccines,  such as: Influenza vaccine. This is recommended every year. Tetanus, diphtheria, and acellular pertussis (Tdap, Td)  vaccine. You may need a Td booster every 10 years. Zoster vaccine. You may need this after age 78. Pneumococcal 13-valent conjugate (PCV13) vaccine. One dose is recommended after age 16. Pneumococcal polysaccharide (PPSV23) vaccine. One dose is recommended after age 43. Talk to your health care provider about which screenings and vaccines you need and how often you need them. This information is not intended to replace advice given to you by your health care provider. Make sure you discuss any questions you have with your health care provider. Document Released: 05/02/2015 Document Revised: 12/24/2015 Document Reviewed: 02/04/2015 Elsevier Interactive Patient Education  2017 [place] Prevention in the Home Falls can cause injuries. They can happen to people of all ages. There are many things you can do to make your home safe and to help prevent falls. What can I do on the outside of my home? Regularly fix the edges of walkways and driveways and fix any cracks. Remove anything that might make you trip as you walk through a door, such as a raised step or threshold. Trim any bushes or trees on the path to your home. Use bright outdoor lighting. Clear any walking paths of anything that might make someone trip, such as rocks or tools. Regularly check to see if handrails are loose or broken. Make sure that both sides of any steps have handrails. Any raised decks and porches should have guardrails on the edges. Have any leaves, snow, or ice cleared regularly. Use sand or salt on walking paths during winter. Clean up any spills in your garage right away. This includes oil or grease spills. What can I do in the bathroom? Use night lights. Install grab bars by the toilet and in the tub and shower. Do not use towel bars as grab bars. Use non-skid mats or decals in the tub or shower. If you need to sit down in the shower, use a plastic, non-slip stool. Keep the floor dry. Clean up any  water that spills on the floor as soon as it happens. Remove soap buildup in the tub or shower regularly. Attach bath mats securely with double-sided non-slip rug tape. Do not have throw rugs and other things on the floor that can make you trip. What can I do in the bedroom? Use night lights. Make sure that you have a light by your bed that is easy to reach. Do not use any sheets or blankets that are too big for your bed. They should not hang down onto the floor. Have a firm chair that has side arms. You can use this for support while you get dressed. Do not have throw rugs and other things on the floor that can make you trip. What can I do in the kitchen? Clean up any spills right away. Avoid walking on wet floors. Keep items that you use a lot in easy-to-reach places. If you need to reach something above you, use a strong step stool that has a grab bar. Keep electrical cords out of the way. Do not use floor polish or wax that makes floors slippery. If you must use wax, use non-skid floor wax. Do not have throw rugs and other things on the floor that can make you trip. What can I do with my stairs? Do not leave any items on the stairs. Make sure that there are handrails on both sides of the stairs and  use them. Fix handrails that are broken or loose. Make sure that handrails are as long as the stairways. Check any carpeting to make sure that it is firmly attached to the stairs. Fix any carpet that is loose or worn. Avoid having throw rugs at the top or bottom of the stairs. If you do have throw rugs, attach them to the floor with carpet tape. Make sure that you have a light switch at the top of the stairs and the bottom of the stairs. If you do not have them, ask someone to add them for you. What else can I do to help prevent falls? Wear shoes that: Do not have high heels. Have rubber bottoms. Are comfortable and fit you well. Are closed at the toe. Do not wear sandals. If you use a  stepladder: Make sure that it is fully opened. Do not climb a closed stepladder. Make sure that both sides of the stepladder are locked into place. Ask someone to hold it for you, if possible. Clearly mark and make sure that you can see: Any grab bars or handrails. First and last steps. Where the edge of each step is. Use tools that help you move around (mobility aids) if they are needed. These include: Canes. Walkers. Scooters. Crutches. Turn on the lights when you go into a dark area. Replace any light bulbs as soon as they burn out. Set up your furniture so you have a clear path. Avoid moving your furniture around. If any of your floors are uneven, fix them. If there are any pets around you, be aware of where they are. Review your medicines with your doctor. Some medicines can make you feel dizzy. This can increase your chance of falling. Ask your doctor what other things that you can do to help prevent falls. This information is not intended to replace advice given to you by your health care provider. Make sure you discuss any questions you have with your health care provider. Document Released: 01/30/2009 Document Revised: 09/11/2015 Document Reviewed: 05/10/2014 Elsevier Interactive Patient Education  2017 [employer].

## 2021-04-07 ENCOUNTER — Ambulatory Visit: Payer: Medicare PPO | Admitting: Family Medicine

## 2021-04-07 ENCOUNTER — Other Ambulatory Visit: Payer: Self-pay

## 2021-04-07 ENCOUNTER — Encounter: Payer: Self-pay | Admitting: Family Medicine

## 2021-04-07 VITALS — BP 122/88 | HR 68 | Ht 69.0 in | Wt 195.0 lb

## 2021-04-07 DIAGNOSIS — J0100 Acute maxillary sinusitis, unspecified: Secondary | ICD-10-CM

## 2021-04-07 DIAGNOSIS — R051 Acute cough: Secondary | ICD-10-CM

## 2021-04-07 MED ORDER — AMOXICILLIN 500 MG PO CAPS: 500.0000 mg | ORAL_CAPSULE | Freq: Three times a day (TID) | ORAL | 0 refills | Status: AC

## 2021-04-07 MED ORDER — BENZONATATE 100 MG PO CAPS: 100.0000 mg | ORAL_CAPSULE | Freq: Three times a day (TID) | ORAL | 0 refills | Status: DC | PRN

## 2021-04-07 NOTE — Progress Notes (Signed)
Date:  04/07/2021   Name:  Jason Krause   DOB:  31-Dec-1945   MRN:  [id number]   Chief Complaint: Cough (Chest congestion, runny and stuffy nose- no production)  Cough This is a new problem. The current episode started yesterday. The problem has been waxing and waning. The problem occurs every few minutes. The cough is Non-productive. Associated symptoms include nasal congestion, postnasal drip and a sore throat. Pertinent negatives include no chest pain, chills, ear pain, fever, headaches, myalgias, rash, shortness of breath or wheezing. There is no history of environmental allergies.   Lab Results  Component Value Date   NA 144 10/23/2020   K 4.7 10/23/2020   CO2 25 10/23/2020   GLUCOSE 86 10/23/2020   BUN 17 10/23/2020   CREATININE 1.44 (H) 10/23/2020   CALCIUM 9.9 10/23/2020   EGFR 51 (L) 10/23/2020   GFRNONAA 48 (L) 04/23/2020   Lab Results  Component Value Date   CHOL 150 10/23/2020   HDL 42 10/23/2020   LDLCALC 94 10/23/2020   TRIG 69 10/23/2020   CHOLHDL 3.6 11/06/2019   No results found for: TSH No results found for: HGBA1C No results found for: WBC, HGB, HCT, MCV, PLT Lab Results  Component Value Date   ALT 18 10/23/2020   AST 22 10/23/2020   ALKPHOS 89 10/23/2020   BILITOT 0.9 10/23/2020   No results found for: 25OHVITD2, 25OHVITD3, VD25OH   Review of Systems  Constitutional:  Negative for chills and fever.  HENT:  Positive for postnasal drip and sore throat. Negative for drooling, ear discharge and ear pain.   Respiratory:  Positive for cough. Negative for shortness of breath and wheezing.   Cardiovascular:  Negative for chest pain, palpitations and leg swelling.  Gastrointestinal:  Negative for abdominal pain, blood in stool, constipation, diarrhea and nausea.  Endocrine: Negative for polydipsia.  Genitourinary:  Negative for dysuria, frequency, hematuria and urgency.  Musculoskeletal:  Negative for back pain, myalgias and neck pain.  Skin:  Negative  for rash.  Allergic/Immunologic: Negative for environmental allergies.  Neurological:  Negative for dizziness and headaches.  Hematological:  Does not bruise/bleed easily.  Psychiatric/Behavioral:  Negative for suicidal ideas. The patient is not nervous/anxious.    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   Pain in both knees 10/29/2020   Essential tremor 10/29/2020   Leg weakness, bilateral 09/02/2020   Leg pain 06/22/2020   Personal history of colonic polyps    Polyp of descending colon    CKD (chronic kidney disease) stage 3, GFR 30-59 ml/min (HCC) 12/28/2017   Osteopenia 12/28/2017   Polyp of gallbladder 12/28/2017   Right bundle branch block 12/28/2017   SAH (subarachnoid hemorrhage) ([place]) 11/11/2016   Essential hypertension 11/09/2016   Mixed hyperlipidemia 11/08/2016   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   Personal history of renal cell carcinoma 11/08/2016   Cerebrovascular accident ([place]) 10/18/2016   History of kidney cancer 01/19/2007   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    Allergies  Allergen Reactions   Carvedilol Other (See Comments)    "makes me a zombie"    Past Surgical History:  Procedure Laterality Date   COLONOSCOPY WITH PROPOFOL N/A 12/06/2019   Procedure: COLONOSCOPY and biopsy WITH PROPOFOL;  Surgeon: Lucilla Lame, MD;  Location: [place];  Service: Endoscopy;  Laterality: N/A;  priority 4   HERNIA REPAIR  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  Current Meds  Medication Sig   Coenzyme Q10 100 MG capsule Take 1 capsule by mouth daily.   folic acid (FOLVITE) 993 MCG tablet Take 1 tablet by mouth daily.   gabapentin (NEURONTIN) 100 MG capsule Take 2 capsules by mouth 2 (two) times daily. potter   Glucos-Chondroit-Hyaluron-MSM (GLUCOSAMINE CHONDROITIN JOINT PO) Take 2 tablets by mouth daily.   lisinopril (ZESTRIL) 20 MG tablet Take 1 tablet (20 mg total) by mouth daily.   Multiple Vitamins-Minerals (CENTRUM SILVER PO) Take 1 tablet by mouth daily.   propranolol ER (INDERAL LA) 60 MG 24 hr capsule Take 3 capsules (180 mg total) by mouth daily. 2 capsules in am and 1 in the pm   rosuvastatin (CRESTOR) 10 MG tablet TAKE (1) TABLET BY MOUTH EVERY DAY    PHQ 2/9 Scores 03/30/2021 01/09/2021 11/06/2020 10/23/2020  PHQ - 2 Score 0 0 0 0  PHQ- 9 Score - 0 0 0    GAD 7 : Generalized Anxiety Score 01/09/2021 11/06/2020 10/23/2020 07/18/2020  Nervous, Anxious, on Edge 0 0 0 0  Control/stop worrying 0 0 0 0  Worry too much - different things 0 0 0 0  Trouble relaxing 0 0 0 0  Restless 0 0 0 0  Easily annoyed or irritable 0 0 0 0  Afraid - awful might happen 0 0 0 0  Total GAD 7 Score 0 0 0 0    BP Readings from Last 3 Encounters:  04/07/21 122/88  02/10/21 120/64  01/09/21 110/80    Physical Exam Vitals and nursing note reviewed.  HENT:     Head: Normocephalic.     Right Ear: Tympanic membrane, ear canal and external ear normal. There is no impacted cerumen.     Left Ear: Tympanic membrane, ear canal and external ear normal. There is no impacted cerumen.     Nose: Nose normal. No congestion or rhinorrhea.     Mouth/Throat:     Mouth: Mucous membranes are moist.     Pharynx: No oropharyngeal exudate or posterior oropharyngeal erythema.  Eyes:     General: No scleral icterus.       Right eye: No discharge.        Left eye: No discharge.     Conjunctiva/sclera: Conjunctivae normal.      Pupils: Pupils are equal, round, and reactive to light.  Neck:     Thyroid: No thyromegaly.     Vascular: No JVD.     Trachea: No tracheal deviation.  Cardiovascular:     Rate and Rhythm: Normal rate and regular rhythm.     Heart sounds: Normal heart sounds. No murmur heard.   No friction rub. No gallop.  Pulmonary:     Effort: No respiratory distress.     Breath sounds: Normal breath sounds. No wheezing, rhonchi or rales.  Chest:     Chest wall: No tenderness.  Abdominal:  General: Bowel sounds are normal.     Palpations: Abdomen is soft. There is no mass.     Tenderness: There is no abdominal tenderness. There is no guarding or rebound.  Musculoskeletal:        General: No tenderness. Normal range of motion.     Cervical back: Normal range of motion and neck supple.  Lymphadenopathy:     Cervical: No cervical adenopathy.  Skin:    General: Skin is warm.     Findings: No rash.  Neurological:     Mental Status: He is alert and oriented to person, place, and time.     Cranial Nerves: No cranial nerve deficit.     Deep Tendon Reflexes: Reflexes are normal and symmetric.    Wt Readings from Last 3 Encounters:  04/07/21 195 lb (88.5 kg)  02/10/21 194 lb (88 kg)  01/09/21 195 lb (88.5 kg)    BP 122/88    Pulse 68    Ht 5' 9"  (1.753 m)    Wt 195 lb (88.5 kg)    BMI 28.80 kg/m   Assessment and Plan:  1. Acute maxillary sinusitis, recurrence not specified Acute.  Recurrent.  Stable.  Patient is beginning to have early symptoms of maxillary sinusitis.  We will treat with amoxicillin 500 mg 3 times a day for 10 days. - amoxicillin (AMOXIL) 500 MG capsule; Take 1 capsule (500 mg total) by mouth 3 (three) times daily for 10 days.  Dispense: 30 capsule; Refill: 0  2. Acute cough Acute.  Episodic.  Controlled except times when he is watching TV.  We will treat on a as needed basis with Tessalon Perles. - benzonatate (TESSALON PERLES) 100 MG capsule; Take 1 capsule (100 mg  total) by mouth 3 (three) times daily as needed for cough.  Dispense: 20 capsule; Refill: 0

## 2021-04-27 ENCOUNTER — Ambulatory Visit: Payer: Medicare PPO | Admitting: Family Medicine

## 2021-04-27 ENCOUNTER — Encounter: Payer: Self-pay | Admitting: Family Medicine

## 2021-04-27 ENCOUNTER — Other Ambulatory Visit: Payer: Self-pay

## 2021-04-27 ENCOUNTER — Ambulatory Visit: Payer: Self-pay | Admitting: Family Medicine

## 2021-04-27 VITALS — BP 118/86 | HR 61 | Ht 69.0 in | Wt 200.0 lb

## 2021-04-27 DIAGNOSIS — I10 Essential (primary) hypertension: Secondary | ICD-10-CM | POA: Diagnosis not present

## 2021-04-27 DIAGNOSIS — R29898 Other symptoms and signs involving the musculoskeletal system: Secondary | ICD-10-CM

## 2021-04-27 DIAGNOSIS — E782 Mixed hyperlipidemia: Secondary | ICD-10-CM

## 2021-04-27 MED ORDER — PROPRANOLOL HCL ER 60 MG PO CP24: 180.0000 mg | ORAL_CAPSULE | Freq: Every day | ORAL | 1 refills | Status: DC

## 2021-04-27 MED ORDER — ROSUVASTATIN CALCIUM 10 MG PO TABS: ORAL_TABLET | ORAL | 1 refills | Status: DC

## 2021-04-27 MED ORDER — LISINOPRIL 20 MG PO TABS: 20.0000 mg | ORAL_TABLET | Freq: Every day | ORAL | 1 refills | Status: DC

## 2021-04-27 NOTE — Progress Notes (Signed)
Date:  04/27/2021   Name:  Jason Krause   DOB:  15-Feb-1946   MRN:  [id number]   Chief Complaint: Hypertension and Hyperlipidemia  Hypertension This is a chronic problem. The current episode started more than 1 year ago. The problem has been rapidly improving since onset. The problem is controlled. Pertinent negatives include no anxiety, blurred vision, chest pain, headaches, malaise/fatigue, neck pain, orthopnea, palpitations, peripheral edema, PND, shortness of breath or sweats. There are no associated agents to hypertension. Risk factors for coronary artery disease include dyslipidemia. Past treatments include beta blockers and ACE inhibitors. The current treatment provides moderate improvement. There are no compliance problems.  There is no history of angina, kidney disease, CAD/MI, CVA, heart failure, left ventricular hypertrophy, PVD or retinopathy. There is no history of chronic renal disease, a hypertension causing med or renovascular disease.  Hyperlipidemia This is a chronic problem. The current episode started more than 1 year ago. The problem is controlled. Recent lipid tests were reviewed and are normal. He has no history of chronic renal disease, diabetes, hypothyroidism, liver disease, obesity or nephrotic syndrome. Pertinent negatives include no chest pain, focal sensory loss, focal weakness, leg pain, myalgias or shortness of breath. Current antihyperlipidemic treatment includes statins. The current treatment provides moderate improvement of lipids. There are no compliance problems.  Risk factors for coronary artery disease include dyslipidemia and hypertension.   Lab Results  Component Value Date   NA 144 10/23/2020   K 4.7 10/23/2020   CO2 25 10/23/2020   GLUCOSE 86 10/23/2020   BUN 17 10/23/2020   CREATININE 1.44 (H) 10/23/2020   CALCIUM 9.9 10/23/2020   EGFR 51 (L) 10/23/2020   GFRNONAA 48 (L) 04/23/2020   Lab Results  Component Value Date   CHOL 150 10/23/2020   HDL  42 10/23/2020   LDLCALC 94 10/23/2020   TRIG 69 10/23/2020   CHOLHDL 3.6 11/06/2019   No results found for: TSH No results found for: HGBA1C No results found for: WBC, HGB, HCT, MCV, PLT Lab Results  Component Value Date   ALT 18 10/23/2020   AST 22 10/23/2020   ALKPHOS 89 10/23/2020   BILITOT 0.9 10/23/2020   No results found for: 25OHVITD2, 25OHVITD3, VD25OH   Review of Systems  Constitutional:  Negative for chills, fever and malaise/fatigue.  HENT:  Negative for drooling, ear discharge, ear pain and sore throat.   Eyes:  Negative for blurred vision.  Respiratory:  Negative for cough, shortness of breath and wheezing.   Cardiovascular:  Negative for chest pain, palpitations, orthopnea, leg swelling and PND.  Gastrointestinal:  Negative for abdominal pain, blood in stool, constipation, diarrhea and nausea.  Endocrine: Negative for polydipsia.  Genitourinary:  Negative for dysuria, frequency, hematuria and urgency.  Musculoskeletal:  Negative for back pain, myalgias and neck pain.  Skin:  Negative for rash.  Allergic/Immunologic: Negative for environmental allergies.  Neurological:  Negative for dizziness, focal weakness and headaches.  Hematological:  Does not bruise/bleed easily.  Psychiatric/Behavioral:  Negative for suicidal ideas. The patient is not nervous/anxious.    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   Pain in both knees 10/29/2020   Essential tremor 10/29/2020   Leg weakness, bilateral 09/02/2020   Leg pain 06/22/2020   Personal history of colonic polyps    Polyp of descending colon    CKD (chronic kidney disease) stage 3, GFR 30-59 ml/min ([place]) 12/28/2017  Osteopenia 12/28/2017   Polyp of gallbladder 12/28/2017   Right bundle branch block 12/28/2017   SAH (subarachnoid hemorrhage) ([place]) 11/11/2016   Essential hypertension 11/09/2016   Mixed hyperlipidemia 11/08/2016   Nontraumatic  cortical hemorrhage of left cerebral hemisphere [facility]) 11/08/2016   Personal history of renal cell carcinoma 11/08/2016   Cerebrovascular accident ([place]) 10/18/2016   History of kidney cancer 01/19/2007   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[place]) 01/12/2001    Allergies  Allergen Reactions   Carvedilol Other (See Comments)    "makes me a zombie"    Past Surgical History:  Procedure Laterality Date   COLONOSCOPY WITH PROPOFOL N/A 12/06/2019   Procedure: COLONOSCOPY and biopsy WITH PROPOFOL;  Surgeon: Lucilla Lame, MD;  Location: [place];  Service: Endoscopy;  Laterality: N/A;  priority 4   HERNIA REPAIR     kidney removal due to cancer Right    POLYPECTOMY N/A 12/06/2019   Procedure: POLYPECTOMY;  Surgeon: Lucilla Lame, MD;  Location: [place];  Service: Endoscopy;  Laterality: N/A;   PROSTATE SURGERY     removed    Social History   Tobacco Use   Smoking status: Never   Smokeless tobacco: Never  Vaping Use   Vaping Use: Never used  Substance Use Topics   Alcohol use: Yes    Comment: social   Drug use: Never     Medication list has been reviewed and updated.  Current Meds  Medication Sig   Coenzyme Q10 100 MG capsule Take 1 capsule by mouth daily.   folic acid (FOLVITE) 638 MCG tablet Take 1 tablet by mouth daily.   gabapentin (NEURONTIN) 100 MG capsule Take 2 capsules by mouth 2 (two) times daily. potter   Glucos-Chondroit-Hyaluron-MSM (GLUCOSAMINE CHONDROITIN JOINT PO) Take 2 tablets by mouth daily.   lisinopril (ZESTRIL) 20 MG tablet Take 1 tablet (20 mg total) by mouth daily.   Multiple Vitamins-Minerals (CENTRUM SILVER PO) Take 1 tablet by mouth daily.   propranolol ER (INDERAL LA) 60 MG 24 hr capsule Take 3 capsules (180 mg total) by mouth daily. 2 capsules in am and 1 in the pm   rosuvastatin (CRESTOR) 10 MG tablet TAKE (1) TABLET BY MOUTH EVERY DAY    PHQ 2/9 Scores 04/27/2021 03/30/2021 01/09/2021 11/06/2020  PHQ - 2 Score 0 0 0 0  PHQ- 9 Score 0 - 0 0     GAD 7 : Generalized Anxiety Score 04/27/2021 01/09/2021 11/06/2020 10/23/2020  Nervous, Anxious, on Edge 0 0 0 0  Control/stop worrying 0 0 0 0  Worry too much - different things 0 0 0 0  Trouble relaxing 0 0 0 0  Restless 0 0 0 0  Easily annoyed or irritable 0 0 0 0  Afraid - awful might happen 0 0 0 0  Total GAD 7 Score 0 0 0 0  Anxiety Difficulty Not difficult at all - - -    BP Readings from Last 3 Encounters:  04/27/21 118/86  04/07/21 122/88  02/10/21 120/64    Physical Exam Vitals and nursing note reviewed.  HENT:     Head: Normocephalic.     Right Ear: Tympanic membrane and external ear normal.     Left Ear: Tympanic membrane and external ear normal.     Nose: Nose normal. No congestion or rhinorrhea.  Eyes:     General: No scleral icterus.       Right eye: No discharge.        Left eye: No discharge.  Conjunctiva/sclera: Conjunctivae normal.     Pupils: Pupils are equal, round, and reactive to light.  Neck:     Thyroid: No thyromegaly.     Vascular: No carotid bruit or JVD.     Trachea: No tracheal deviation.  Cardiovascular:     Rate and Rhythm: Normal rate and regular rhythm.     Pulses: Normal pulses.     Heart sounds: Normal heart sounds, S1 normal and S2 normal. No murmur heard. No systolic murmur is present.  No diastolic murmur is present.    No friction rub. No gallop. No S3 or S4 sounds.  Pulmonary:     Effort: No respiratory distress.     Breath sounds: Normal breath sounds. No wheezing, rhonchi or rales.  Chest:     Chest wall: No tenderness.  Abdominal:     General: Bowel sounds are normal.     Palpations: Abdomen is soft. There is no mass.     Tenderness: There is no abdominal tenderness. There is no guarding or rebound.  Musculoskeletal:        General: No tenderness. Normal range of motion.     Cervical back: Normal range of motion and neck supple.     Right lower leg: No edema.     Left lower leg: No edema.  Lymphadenopathy:      Cervical: No cervical adenopathy.  Skin:    General: Skin is warm.     Findings: No rash.  Neurological:     Mental Status: He is alert and oriented to person, place, and time.     Cranial Nerves: No cranial nerve deficit.     Deep Tendon Reflexes: Reflexes are normal and symmetric.    Wt Readings from Last 3 Encounters:  04/27/21 200 lb (90.7 kg)  04/07/21 195 lb (88.5 kg)  02/10/21 194 lb (88 kg)    BP 118/86    Pulse 61    Ht 5' 9"  (1.753 m)    Wt 200 lb (90.7 kg)    SpO2 94%    BMI 29.53 kg/m   Assessment and Plan:  1. Essential hypertension Chronic.  Controlled.  Stable.  Blood pressure 118/86.  Continue lisinopril 20 mg once a day.  And propranolol ER 60 mg 3 capsules daily in a regimen of 2 in the morning and 1 in the evening.  Will check renal function panel for electrolytes. - lisinopril (ZESTRIL) 20 MG tablet; Take 1 tablet (20 mg total) by mouth daily.  Dispense: 90 tablet; Refill: 1 - propranolol ER (INDERAL LA) 60 MG 24 hr capsule; Take 3 capsules (180 mg total) by mouth daily. 2 capsules in am and 1 in the pm  Dispense: 270 capsule; Refill: 1 - Renal Function Panel  2. Mixed hyperlipidemia Chronic.  Controlled.  Stable.  Continue rosuvastatin 10 mg once a day.  Will check lipid panel. - rosuvastatin (CRESTOR) 10 MG tablet; TAKE (1) TABLET BY MOUTH EVERY DAY  Dispense: 90 tablet; Refill: 1 - Lipid Panel With LDL/HDL Ratio  3. Leg weakness, bilateral Patient continues to have bilateral leg weakness particular if he walks some distance or up 1 flight of stairs in fact he cannot stand for periods of time before his legs start developed weakness.  The factors that we have look for claudication and we have also evaluated for neurologic concerns and patient has had rehab and has not really improved.  The only other thing I do have concerns is whether this is a manifestation of  congestive heart.  That the legs do not get enough cardiac output to them when they are put to work  and is not necessarily secondary to vascular insufficiency.  We will refer to cardiology for evaluation determine whether or not there is any concern for decreased cardiac output. - Ambulatory referral to Cardiology

## 2021-04-28 LAB — RENAL FUNCTION PANEL
Albumin: 4.5 g/dL (ref 3.7–4.7)
BUN/Creatinine Ratio: 11 (ref 10–24)
BUN: 16 mg/dL (ref 8–27)
CO2: 28 mmol/L (ref 20–29)
Calcium: 10.3 mg/dL — ABNORMAL HIGH (ref 8.6–10.2)
Chloride: 104 mmol/L (ref 96–106)
Creatinine, Ser: 1.46 mg/dL — ABNORMAL HIGH (ref 0.76–1.27)
Glucose: 92 mg/dL (ref 70–99)
Phosphorus: 3.3 mg/dL (ref 2.8–4.1)
Potassium: 5.1 mmol/L (ref 3.5–5.2)
Sodium: 143 mmol/L (ref 134–144)
eGFR: 50 mL/min/{1.73_m2} — ABNORMAL LOW (ref >59)

## 2021-04-28 LAB — LIPID PANEL WITH LDL/HDL RATIO
Cholesterol, Total: 175 mg/dL (ref 100–199)
HDL: 49 mg/dL (ref >39)
LDL Chol Calc (NIH): 112 mg/dL — ABNORMAL HIGH (ref 0–99)
LDL/HDL Ratio: 2.3 ratio (ref 0.0–3.6)
Triglycerides: 77 mg/dL (ref 0–149)
VLDL Cholesterol Cal: 14 mg/dL (ref 5–40)

## 2021-05-04 DIAGNOSIS — H2513 Age-related nuclear cataract, bilateral: Secondary | ICD-10-CM | POA: Diagnosis not present

## 2021-05-04 DIAGNOSIS — H52213 Irregular astigmatism, bilateral: Secondary | ICD-10-CM | POA: Diagnosis not present

## 2021-05-04 DIAGNOSIS — H04223 Epiphora due to insufficient drainage, bilateral lacrimal glands: Secondary | ICD-10-CM | POA: Diagnosis not present

## 2021-05-04 DIAGNOSIS — H35033 Hypertensive retinopathy, bilateral: Secondary | ICD-10-CM | POA: Diagnosis not present

## 2021-05-04 DIAGNOSIS — H524 Presbyopia: Secondary | ICD-10-CM | POA: Diagnosis not present

## 2021-05-04 DIAGNOSIS — H25013 Cortical age-related cataract, bilateral: Secondary | ICD-10-CM | POA: Diagnosis not present

## 2021-05-04 DIAGNOSIS — H2511 Age-related nuclear cataract, right eye: Secondary | ICD-10-CM | POA: Diagnosis not present

## 2021-05-04 DIAGNOSIS — H04123 Dry eye syndrome of bilateral lacrimal glands: Secondary | ICD-10-CM | POA: Diagnosis not present

## 2021-05-04 LAB — HM DIABETES EYE EXAM

## 2021-05-19 DIAGNOSIS — R0602 Shortness of breath: Secondary | ICD-10-CM | POA: Diagnosis not present

## 2021-05-19 DIAGNOSIS — I739 Peripheral vascular disease, unspecified: Secondary | ICD-10-CM | POA: Diagnosis not present

## 2021-05-19 DIAGNOSIS — I63329 Cerebral infarction due to thrombosis of unspecified anterior cerebral artery: Secondary | ICD-10-CM | POA: Diagnosis not present

## 2021-05-19 DIAGNOSIS — N1831 Chronic kidney disease, stage 3a: Secondary | ICD-10-CM | POA: Diagnosis not present

## 2021-05-19 DIAGNOSIS — E782 Mixed hyperlipidemia: Secondary | ICD-10-CM | POA: Diagnosis not present

## 2021-05-19 DIAGNOSIS — I10 Essential (primary) hypertension: Secondary | ICD-10-CM | POA: Diagnosis not present

## 2021-05-19 DIAGNOSIS — R9431 Abnormal electrocardiogram [ECG] [EKG]: Secondary | ICD-10-CM | POA: Diagnosis not present

## 2021-06-08 DIAGNOSIS — R251 Tremor, unspecified: Secondary | ICD-10-CM | POA: Diagnosis not present

## 2021-06-08 DIAGNOSIS — R29898 Other symptoms and signs involving the musculoskeletal system: Secondary | ICD-10-CM | POA: Diagnosis not present

## 2021-06-08 DIAGNOSIS — M25562 Pain in left knee: Secondary | ICD-10-CM | POA: Diagnosis not present

## 2021-06-08 DIAGNOSIS — M25561 Pain in right knee: Secondary | ICD-10-CM | POA: Diagnosis not present

## 2021-06-08 DIAGNOSIS — R278 Other lack of coordination: Secondary | ICD-10-CM | POA: Diagnosis not present

## 2021-06-15 DIAGNOSIS — R9431 Abnormal electrocardiogram [ECG] [EKG]: Secondary | ICD-10-CM | POA: Diagnosis not present

## 2021-06-15 DIAGNOSIS — R0602 Shortness of breath: Secondary | ICD-10-CM | POA: Diagnosis not present

## 2021-06-16 DIAGNOSIS — H25811 Combined forms of age-related cataract, right eye: Secondary | ICD-10-CM | POA: Diagnosis not present

## 2021-06-16 DIAGNOSIS — H2511 Age-related nuclear cataract, right eye: Secondary | ICD-10-CM | POA: Diagnosis not present

## 2021-06-23 DIAGNOSIS — H2511 Age-related nuclear cataract, right eye: Secondary | ICD-10-CM | POA: Diagnosis not present

## 2021-06-29 DIAGNOSIS — N1831 Chronic kidney disease, stage 3a: Secondary | ICD-10-CM | POA: Diagnosis not present

## 2021-06-29 DIAGNOSIS — R0602 Shortness of breath: Secondary | ICD-10-CM | POA: Diagnosis not present

## 2021-06-29 DIAGNOSIS — I4510 Unspecified right bundle-branch block: Secondary | ICD-10-CM | POA: Diagnosis not present

## 2021-06-29 DIAGNOSIS — I10 Essential (primary) hypertension: Secondary | ICD-10-CM | POA: Diagnosis not present

## 2021-06-29 DIAGNOSIS — I63329 Cerebral infarction due to thrombosis of unspecified anterior cerebral artery: Secondary | ICD-10-CM | POA: Diagnosis not present

## 2021-06-29 DIAGNOSIS — E782 Mixed hyperlipidemia: Secondary | ICD-10-CM | POA: Diagnosis not present

## 2021-07-09 ENCOUNTER — Ambulatory Visit: Payer: Medicare PPO | Admitting: Family Medicine

## 2021-07-22 DIAGNOSIS — H2512 Age-related nuclear cataract, left eye: Secondary | ICD-10-CM | POA: Diagnosis not present

## 2021-07-22 DIAGNOSIS — H25012 Cortical age-related cataract, left eye: Secondary | ICD-10-CM | POA: Diagnosis not present

## 2021-07-28 DIAGNOSIS — H2512 Age-related nuclear cataract, left eye: Secondary | ICD-10-CM | POA: Diagnosis not present

## 2021-07-28 DIAGNOSIS — H25012 Cortical age-related cataract, left eye: Secondary | ICD-10-CM | POA: Diagnosis not present

## 2021-07-28 DIAGNOSIS — H25812 Combined forms of age-related cataract, left eye: Secondary | ICD-10-CM | POA: Diagnosis not present

## 2021-08-05 DIAGNOSIS — H2512 Age-related nuclear cataract, left eye: Secondary | ICD-10-CM | POA: Diagnosis not present

## 2021-08-31 DIAGNOSIS — M170 Bilateral primary osteoarthritis of knee: Secondary | ICD-10-CM | POA: Diagnosis not present

## 2021-08-31 DIAGNOSIS — M1712 Unilateral primary osteoarthritis, left knee: Secondary | ICD-10-CM | POA: Diagnosis not present

## 2021-09-02 DIAGNOSIS — I10 Essential (primary) hypertension: Secondary | ICD-10-CM | POA: Diagnosis not present

## 2021-09-02 DIAGNOSIS — I63329 Cerebral infarction due to thrombosis of unspecified anterior cerebral artery: Secondary | ICD-10-CM | POA: Diagnosis not present

## 2021-09-02 DIAGNOSIS — E782 Mixed hyperlipidemia: Secondary | ICD-10-CM | POA: Diagnosis not present

## 2021-09-04 DIAGNOSIS — M25561 Pain in right knee: Secondary | ICD-10-CM | POA: Diagnosis not present

## 2021-09-04 DIAGNOSIS — M170 Bilateral primary osteoarthritis of knee: Secondary | ICD-10-CM | POA: Diagnosis not present

## 2021-09-04 DIAGNOSIS — M25562 Pain in left knee: Secondary | ICD-10-CM | POA: Diagnosis not present

## 2021-10-07 DIAGNOSIS — M25562 Pain in left knee: Secondary | ICD-10-CM | POA: Diagnosis not present

## 2021-10-07 DIAGNOSIS — M170 Bilateral primary osteoarthritis of knee: Secondary | ICD-10-CM | POA: Diagnosis not present

## 2021-10-07 DIAGNOSIS — M25561 Pain in right knee: Secondary | ICD-10-CM | POA: Diagnosis not present

## 2021-10-12 ENCOUNTER — Encounter: Payer: Self-pay | Admitting: Family Medicine

## 2021-10-12 ENCOUNTER — Ambulatory Visit: Payer: Medicare PPO | Admitting: Family Medicine

## 2021-10-12 VITALS — BP 137/87 | HR 50 | Ht 69.0 in | Wt 198.4 lb

## 2021-10-12 DIAGNOSIS — I10 Essential (primary) hypertension: Secondary | ICD-10-CM | POA: Diagnosis not present

## 2021-10-12 DIAGNOSIS — R944 Abnormal results of kidney function studies: Secondary | ICD-10-CM

## 2021-10-12 DIAGNOSIS — E782 Mixed hyperlipidemia: Secondary | ICD-10-CM

## 2021-10-12 MED ORDER — LISINOPRIL 20 MG PO TABS: 20.0000 mg | ORAL_TABLET | Freq: Every day | ORAL | 1 refills | Status: DC

## 2021-10-12 MED ORDER — PROPRANOLOL HCL ER 60 MG PO CP24: 180.0000 mg | ORAL_CAPSULE | Freq: Every day | ORAL | 1 refills | Status: DC

## 2021-10-13 LAB — RENAL FUNCTION PANEL
Albumin: 4.1 g/dL (ref 3.7–4.7)
BUN/Creatinine Ratio: 11 (ref 10–24)
BUN: 18 mg/dL (ref 8–27)
CO2: 26 mmol/L (ref 20–29)
Calcium: 10.5 mg/dL — ABNORMAL HIGH (ref 8.6–10.2)
Chloride: 104 mmol/L (ref 96–106)
Creatinine, Ser: 1.61 mg/dL — ABNORMAL HIGH (ref 0.76–1.27)
Glucose: 93 mg/dL (ref 70–99)
Phosphorus: 3.5 mg/dL (ref 2.8–4.1)
Potassium: 5.1 mmol/L (ref 3.5–5.2)
Sodium: 140 mmol/L (ref 134–144)
eGFR: 45 mL/min/{1.73_m2} — ABNORMAL LOW (ref >59)

## 2021-10-13 LAB — LIPID PANEL WITH LDL/HDL RATIO
Cholesterol, Total: 198 mg/dL (ref 100–199)
HDL: 41 mg/dL (ref >39)
LDL Chol Calc (NIH): 143 mg/dL — ABNORMAL HIGH (ref 0–99)
LDL/HDL Ratio: 3.5 ratio (ref 0.0–3.6)
Triglycerides: 74 mg/dL (ref 0–149)
VLDL Cholesterol Cal: 14 mg/dL (ref 5–40)

## 2021-10-14 DIAGNOSIS — M170 Bilateral primary osteoarthritis of knee: Secondary | ICD-10-CM | POA: Diagnosis not present

## 2021-10-14 DIAGNOSIS — M25561 Pain in right knee: Secondary | ICD-10-CM | POA: Diagnosis not present

## 2021-10-14 DIAGNOSIS — M25562 Pain in left knee: Secondary | ICD-10-CM | POA: Diagnosis not present

## 2021-10-26 ENCOUNTER — Ambulatory Visit: Payer: Medicare PPO | Admitting: Family Medicine

## 2021-11-17 ENCOUNTER — Ambulatory Visit
Admission: RE | Admit: 2021-11-17 | Discharge: 2021-11-17 | Disposition: A | Discharge status: Home / Self Care | Payer: Medicare PPO | Source: Ambulatory Visit | Attending: Urology | Admitting: Urology

## 2021-11-17 ENCOUNTER — Ambulatory Visit: Admit: 2021-11-17 | Payer: Medicare PPO

## 2021-11-17 ENCOUNTER — Other Ambulatory Visit: Admit: 2021-11-17 | Payer: Medicare PPO

## 2021-11-17 DIAGNOSIS — R399 Unspecified symptoms and signs involving the genitourinary system: Secondary | ICD-10-CM | POA: Insufficient documentation

## 2021-11-17 DIAGNOSIS — Q6102 Congenital multiple renal cysts: Secondary | ICD-10-CM | POA: Insufficient documentation

## 2021-11-17 DIAGNOSIS — N281 Cyst of kidney, acquired: Secondary | ICD-10-CM | POA: Diagnosis not present

## 2021-11-17 DIAGNOSIS — Z905 Acquired absence of kidney: Secondary | ICD-10-CM | POA: Insufficient documentation

## 2021-12-01 ENCOUNTER — Encounter: Payer: Self-pay | Admitting: Urology

## 2021-12-01 ENCOUNTER — Ambulatory Visit: Payer: Medicare PPO | Admitting: Urology

## 2021-12-01 VITALS — BP 139/91 | HR 56 | Ht 69.0 in | Wt 194.0 lb

## 2021-12-01 DIAGNOSIS — Z85528 Personal history of other malignant neoplasm of kidney: Secondary | ICD-10-CM | POA: Diagnosis not present

## 2021-12-01 DIAGNOSIS — R399 Unspecified symptoms and signs involving the genitourinary system: Secondary | ICD-10-CM

## 2021-12-01 DIAGNOSIS — R350 Frequency of micturition: Secondary | ICD-10-CM | POA: Diagnosis not present

## 2021-12-01 DIAGNOSIS — Z8546 Personal history of malignant neoplasm of prostate: Secondary | ICD-10-CM | POA: Diagnosis not present

## 2021-12-01 DIAGNOSIS — N3941 Urge incontinence: Secondary | ICD-10-CM | POA: Diagnosis not present

## 2021-12-01 DIAGNOSIS — C61 Malignant neoplasm of prostate: Secondary | ICD-10-CM

## 2021-12-01 DIAGNOSIS — R351 Nocturia: Secondary | ICD-10-CM | POA: Diagnosis not present

## 2021-12-01 NOTE — Progress Notes (Signed)
   12/01/2021 8:45 AM   Jason Krause 06-22-1945 [id number]  Reason for visit: History of prostate cancer, kidney cancer, urinary urgency/frequency/urge incontinence, CKD  HPI: 77 year old healthy male with a history of an open radical prostatectomy about 20 years ago, with undetectable PSA since that time, as well as an open right radical nephrectomy approximately 10 years ago for kidney cancer.  Those records are unavailable to me(Dr. Sherlyn Lick at [facility] urological).  He has opted to discontinue PSA surveillance now that he has over 20 years out from his prostate cancer which is very reasonable per the AUA guidelines.  Regarding his history of RCC, we reviewed the AUA guidelines discussing surveillance, and he opts to continue a yearly renal ultrasound.  I personally viewed and interpreted the renal ultrasound from 11/17/2021 that shows a prior right nephrectomy, and no solid left renal masses or hydronephrosis, stable left renal cysts.  Last year he had reported some spraying and split urinary stream and underwent a cystoscopy in August 2022.  This was benign with prostate surgically absent and excellent coaptation of the sphincter on demand, and bladder was normal.  He reports some worsening frequency, urgency, and urge incontinence over the last year, as well as nocturia twice overnight.  He is next water during the day.  We discussed options, and I recommended a trial of Myrbetriq 50 mg daily for more of an overactive bladder picture.  He will call to let us know if he would like a refill on the Myrbetriq or try different medication.  Baseline creatinine has been around 1.4 over the last few years with eGFR 50.  Most recent creatinine had a slight bump to 1.61, EGFR 45, and PCP planning to repeat in the next month.  -Trial of Myrbetriq 50 mg daily, he will contact us via MyChart/phone if he desires refill or trial of different medication -Continue yearly renal ultrasounds per patient preference  for history of RCC -RTC 1 year  Billey Co, MD  [place] [address] [place], [place] [address] [phone]

## 2021-12-14 ENCOUNTER — Other Ambulatory Visit: Payer: Self-pay

## 2021-12-14 ENCOUNTER — Telehealth: Payer: Self-pay | Admitting: Family Medicine

## 2021-12-14 DIAGNOSIS — I10 Essential (primary) hypertension: Secondary | ICD-10-CM

## 2021-12-14 DIAGNOSIS — E782 Mixed hyperlipidemia: Secondary | ICD-10-CM

## 2021-12-14 DIAGNOSIS — M169 Osteoarthritis of hip, unspecified: Secondary | ICD-10-CM | POA: Insufficient documentation

## 2021-12-14 DIAGNOSIS — M1611 Unilateral primary osteoarthritis, right hip: Secondary | ICD-10-CM | POA: Diagnosis not present

## 2021-12-14 MED ORDER — ROSUVASTATIN CALCIUM 10 MG PO TABS
10.0000 mg | ORAL_TABLET | Freq: Every day | ORAL | 0 refills | Status: DC
Start: 2021-12-14 — End: 2022-06-04

## 2021-12-14 NOTE — Progress Notes (Signed)
Printed paperwork for labs

## 2021-12-14 NOTE — Telephone Encounter (Signed)
Copied from [place] [phone]. Topic: General - Call Back - No Documentation >> Dec 14, 2021 10:46 AM Sabas Sous wrote: Reason for CRM: Pt called requesting to speak to a nurse regarding labs, he says he wants to reschedule but needs to speak to PCP's nurse first   Best contact: [phone]

## 2021-12-16 DIAGNOSIS — E782 Mixed hyperlipidemia: Secondary | ICD-10-CM | POA: Diagnosis not present

## 2021-12-16 DIAGNOSIS — I10 Essential (primary) hypertension: Secondary | ICD-10-CM | POA: Diagnosis not present

## 2021-12-16 IMAGING — US US RENAL
1 series · 14 of 25 positions shown · non-contrast
Comparison: 11/28/2019

CLINICAL DATA: History of right renal cancer post nephrectomy. Also
prostate cancer.

EXAM:
RENAL / URINARY TRACT ULTRASOUND COMPLETE

[Series 1: us renal · 0.25mm/px · 40 acquisitions, 14 frames shown]
[im 1/40]
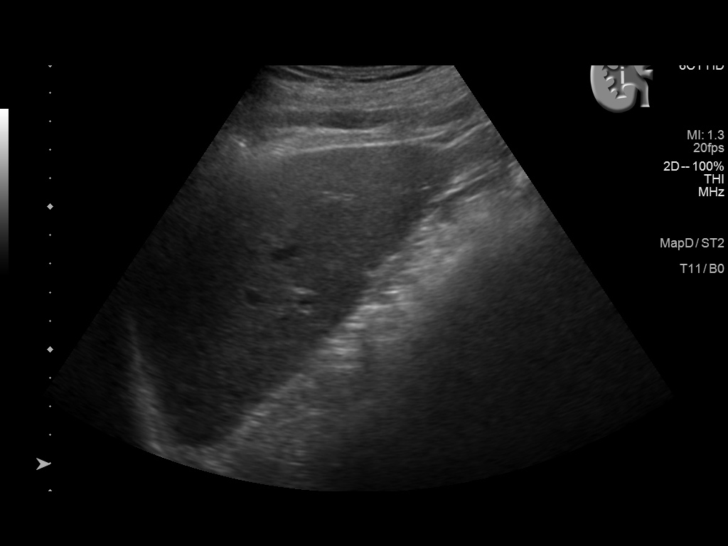
[im 4/40]
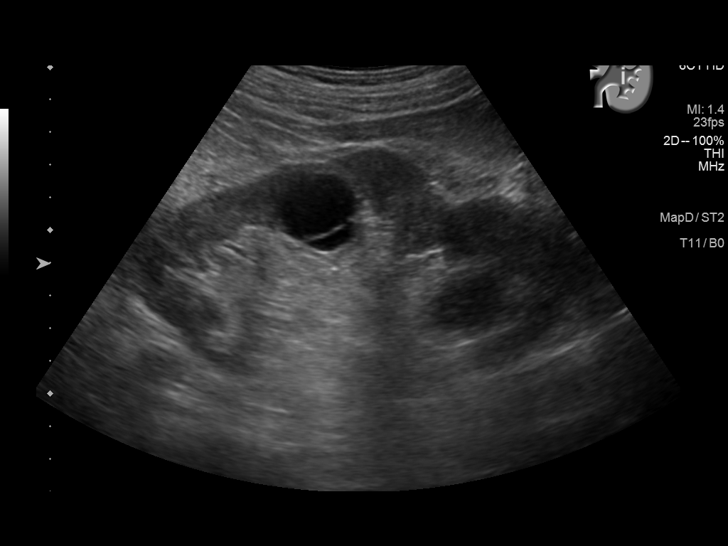
[im 7/40]
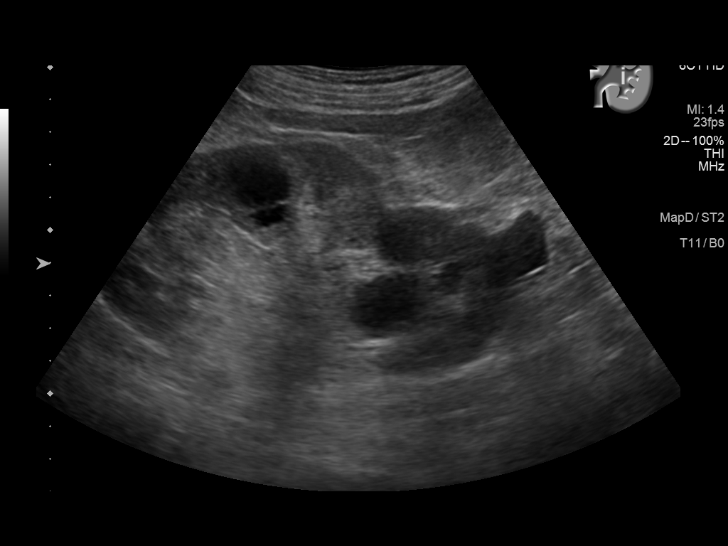
[im 10/40]
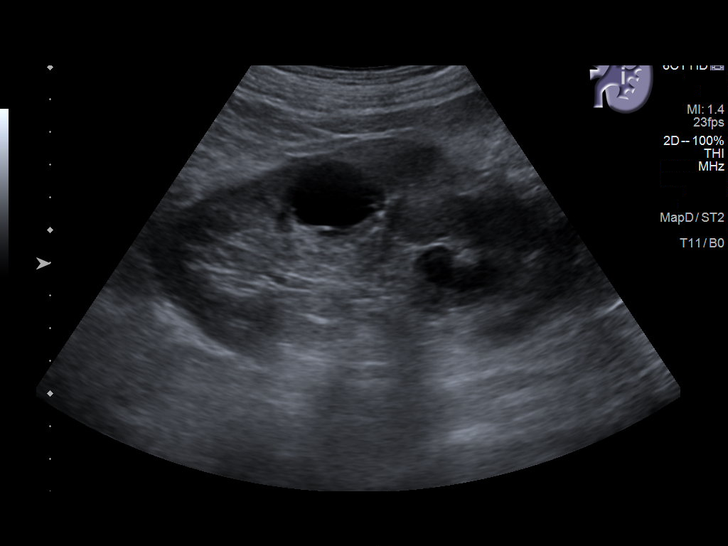
[im 14/40]
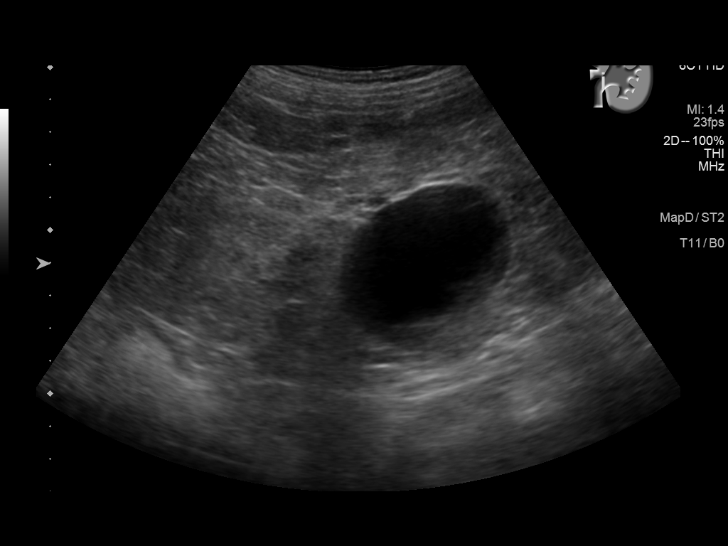
[im 15/40]
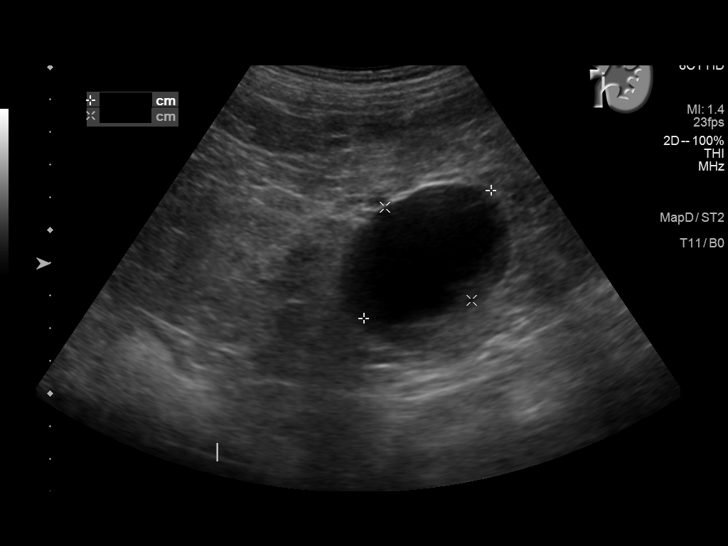
[im 18/40]
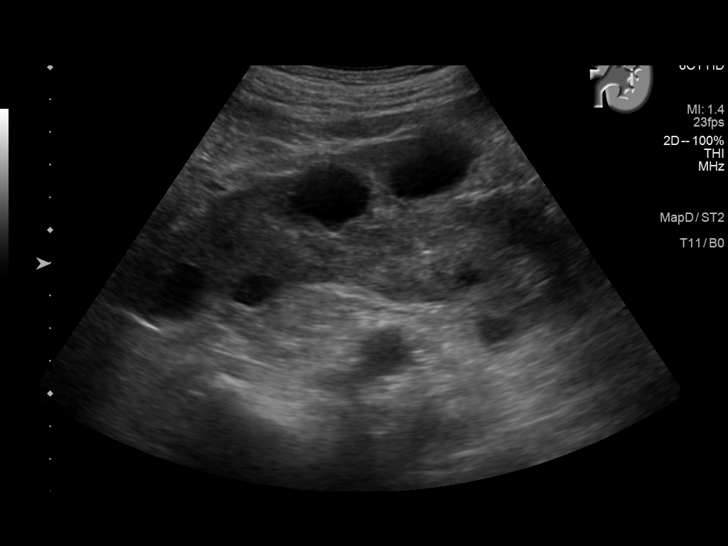
[im 22/40]
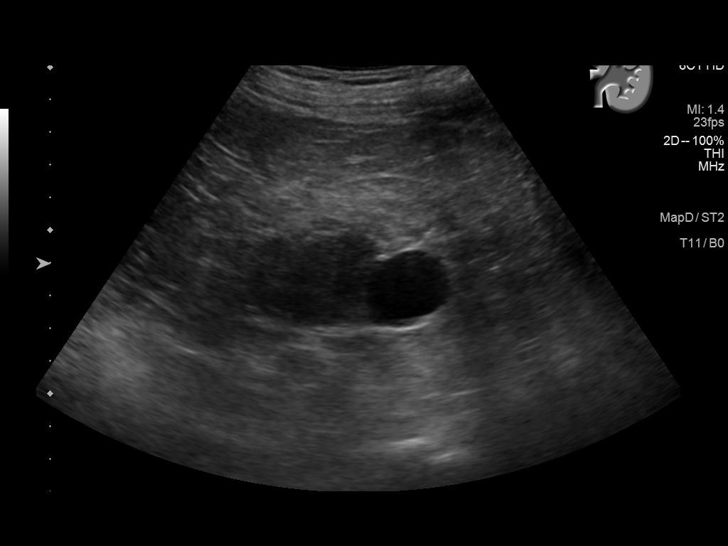
[im 25/40]
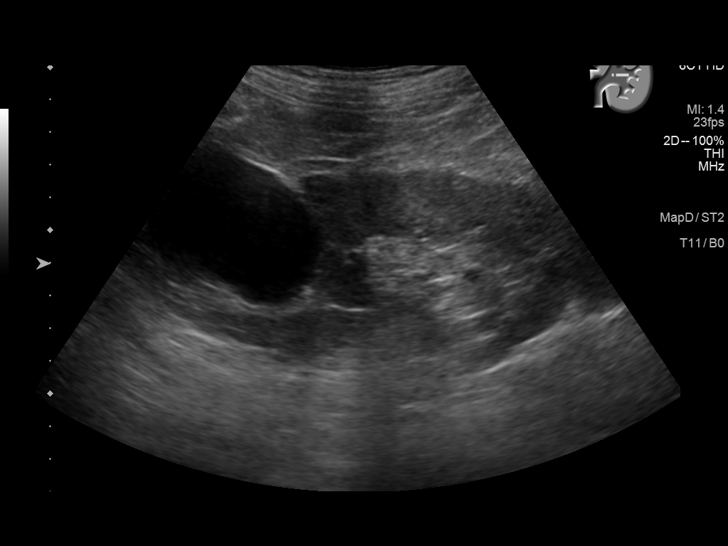
[im 27/40]
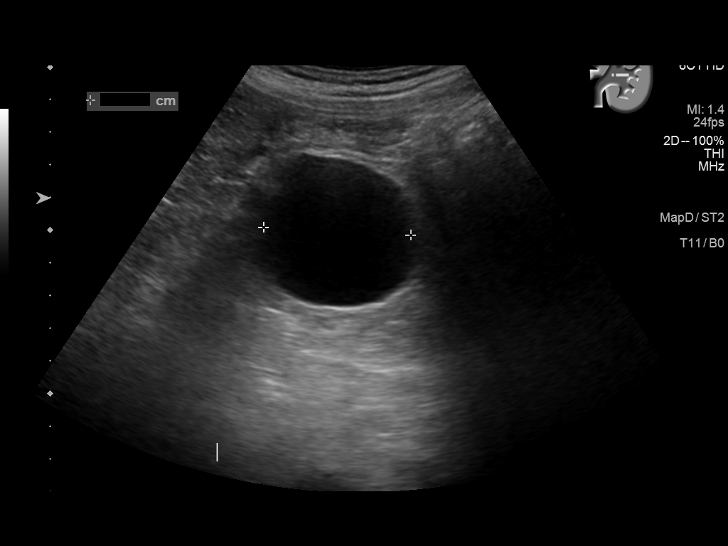
[im 30/40]
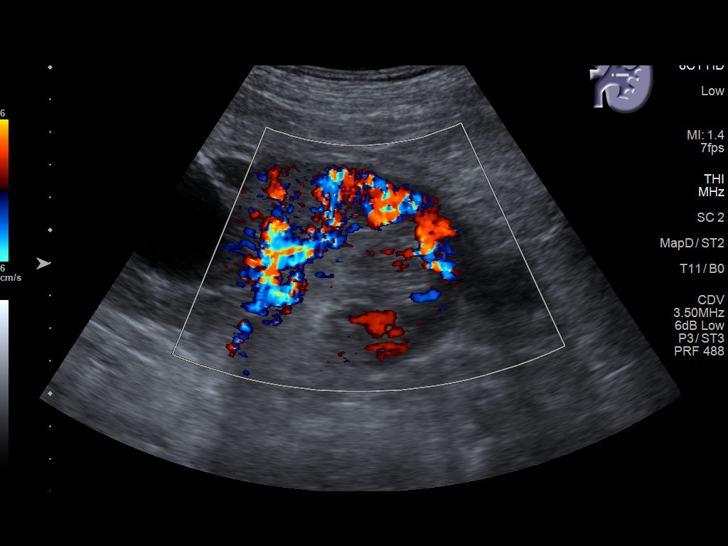
[im 33/40]
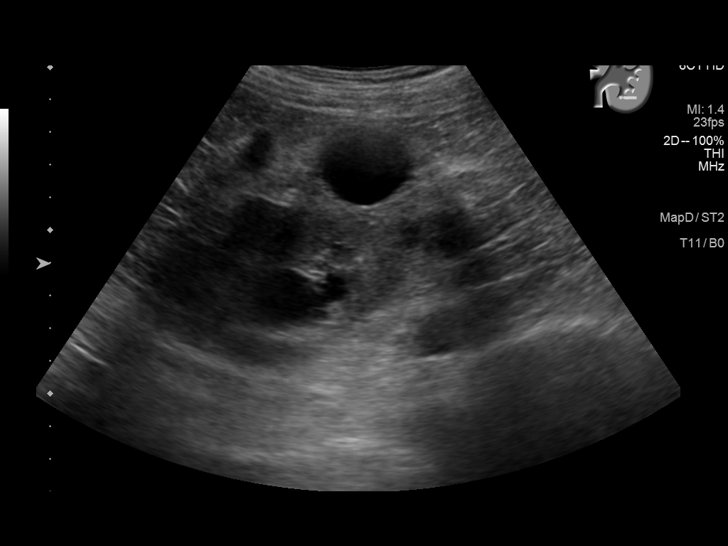
[im 36/40]
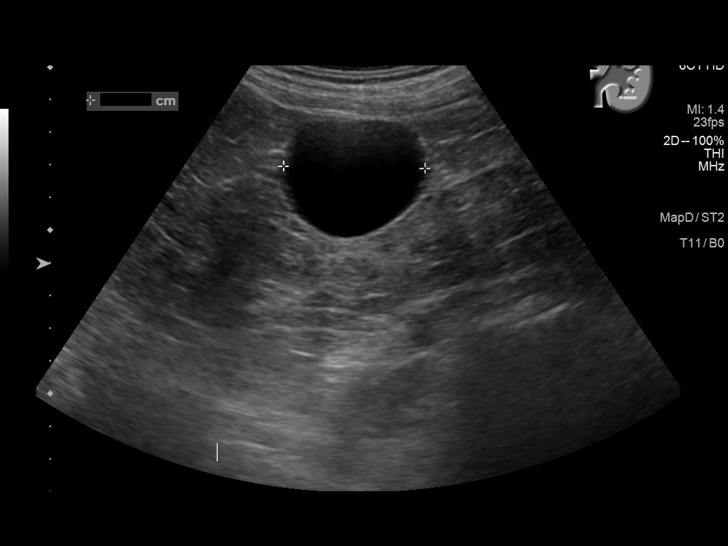
[im 40/40]
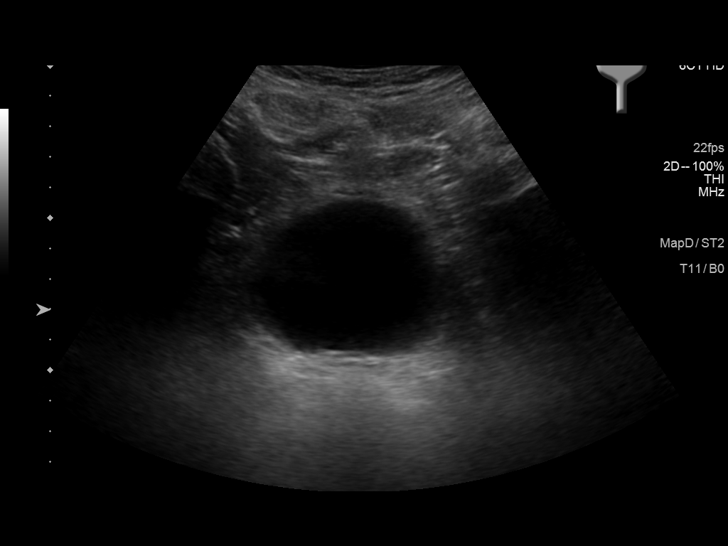

[14 of 25 positions shown; findings below may reference images not displayed]

FINDINGS: Right Kidney:

Previous right nephrectomy.

Left Kidney:

Renal measurements: 14.0 x 7.2 x 6.5 cm = volume: 343 mL.
Echogenicity within normal limits. No hydronephrosis visualized.
Numerous cysts are present with the largest over the mid to lower
pole measuring 5.5 cm.

Bladder:

Appears normal for degree of bladder distention.

Other:

None.
IMPRESSION: Number size left kidney without hydronephrosis. Numerous cysts
unchanged. Previous right nephrectomy.

## 2021-12-17 DIAGNOSIS — M1611 Unilateral primary osteoarthritis, right hip: Secondary | ICD-10-CM | POA: Diagnosis not present

## 2021-12-17 DIAGNOSIS — M25551 Pain in right hip: Secondary | ICD-10-CM | POA: Diagnosis not present

## 2021-12-17 LAB — COMPREHENSIVE METABOLIC PANEL
ALT: 15 IU/L (ref 0–44)
AST: 21 IU/L (ref 0–40)
Albumin/Globulin Ratio: 2.1 (ref 1.2–2.2)
Albumin: 4.2 g/dL (ref 3.8–4.8)
Alkaline Phosphatase: 91 IU/L (ref 44–121)
BUN/Creatinine Ratio: 14 (ref 10–24)
BUN: 20 mg/dL (ref 8–27)
Bilirubin Total: 0.9 mg/dL (ref 0.0–1.2)
CO2: 24 mmol/L (ref 20–29)
Calcium: 10 mg/dL (ref 8.6–10.2)
Chloride: 107 mmol/L — ABNORMAL HIGH (ref 96–106)
Creatinine, Ser: 1.47 mg/dL — ABNORMAL HIGH (ref 0.76–1.27)
Globulin, Total: 2 g/dL (ref 1.5–4.5)
Glucose: 84 mg/dL (ref 70–99)
Potassium: 4.6 mmol/L (ref 3.5–5.2)
Sodium: 144 mmol/L (ref 134–144)
Total Protein: 6.2 g/dL (ref 6.0–8.5)
eGFR: 49 mL/min/{1.73_m2} — ABNORMAL LOW (ref >59)

## 2021-12-17 LAB — LIPID PANEL WITH LDL/HDL RATIO
Cholesterol, Total: 140 mg/dL (ref 100–199)
HDL: 42 mg/dL (ref >39)
LDL Chol Calc (NIH): 83 mg/dL (ref 0–99)
LDL/HDL Ratio: 2 ratio (ref 0.0–3.6)
Triglycerides: 74 mg/dL (ref 0–149)
VLDL Cholesterol Cal: 15 mg/dL (ref 5–40)

## 2022-01-12 DIAGNOSIS — M1611 Unilateral primary osteoarthritis, right hip: Secondary | ICD-10-CM | POA: Diagnosis not present

## 2022-01-12 DIAGNOSIS — M25551 Pain in right hip: Secondary | ICD-10-CM | POA: Diagnosis not present

## 2022-01-20 DIAGNOSIS — C649 Malignant neoplasm of unspecified kidney, except renal pelvis: Secondary | ICD-10-CM | POA: Diagnosis not present

## 2022-01-20 DIAGNOSIS — I10 Essential (primary) hypertension: Secondary | ICD-10-CM | POA: Diagnosis not present

## 2022-01-20 DIAGNOSIS — N183 Chronic kidney disease, stage 3 unspecified: Secondary | ICD-10-CM | POA: Diagnosis not present

## 2022-02-15 ENCOUNTER — Encounter (INDEPENDENT_AMBULATORY_CARE_PROVIDER_SITE_OTHER): Payer: Self-pay

## 2022-02-17 DIAGNOSIS — R29898 Other symptoms and signs involving the musculoskeletal system: Secondary | ICD-10-CM | POA: Diagnosis not present

## 2022-02-22 DIAGNOSIS — Z85828 Personal history of other malignant neoplasm of skin: Secondary | ICD-10-CM | POA: Diagnosis not present

## 2022-02-22 DIAGNOSIS — D0359 Melanoma in situ of other part of trunk: Secondary | ICD-10-CM | POA: Diagnosis not present

## 2022-02-22 DIAGNOSIS — L578 Other skin changes due to chronic exposure to nonionizing radiation: Secondary | ICD-10-CM | POA: Diagnosis not present

## 2022-02-22 DIAGNOSIS — D485 Neoplasm of uncertain behavior of skin: Secondary | ICD-10-CM | POA: Diagnosis not present

## 2022-02-22 DIAGNOSIS — Z85820 Personal history of malignant melanoma of skin: Secondary | ICD-10-CM | POA: Diagnosis not present

## 2022-02-22 DIAGNOSIS — L570 Actinic keratosis: Secondary | ICD-10-CM | POA: Diagnosis not present

## 2022-02-22 DIAGNOSIS — Z872 Personal history of diseases of the skin and subcutaneous tissue: Secondary | ICD-10-CM | POA: Diagnosis not present

## 2022-03-03 DIAGNOSIS — H1045 Other chronic allergic conjunctivitis: Secondary | ICD-10-CM | POA: Diagnosis not present

## 2022-03-03 DIAGNOSIS — H26493 Other secondary cataract, bilateral: Secondary | ICD-10-CM | POA: Diagnosis not present

## 2022-03-29 DIAGNOSIS — L988 Other specified disorders of the skin and subcutaneous tissue: Secondary | ICD-10-CM | POA: Diagnosis not present

## 2022-03-29 DIAGNOSIS — C4359 Malignant melanoma of other part of trunk: Secondary | ICD-10-CM | POA: Diagnosis not present

## 2022-03-29 DIAGNOSIS — H26493 Other secondary cataract, bilateral: Secondary | ICD-10-CM | POA: Diagnosis not present

## 2022-03-29 DIAGNOSIS — H1045 Other chronic allergic conjunctivitis: Secondary | ICD-10-CM | POA: Diagnosis not present

## 2022-03-29 DIAGNOSIS — H02831 Dermatochalasis of right upper eyelid: Secondary | ICD-10-CM | POA: Diagnosis not present

## 2022-03-29 DIAGNOSIS — H02834 Dermatochalasis of left upper eyelid: Secondary | ICD-10-CM | POA: Diagnosis not present

## 2022-03-30 NOTE — Progress Notes (Unsigned)
Subjective:   Jason Krause is a 76 y.o. male who presents for Medicare Annual/Subsequent preventive examination.  I connected with  [name] on 03/31/22 by a audio enabled telemedicine application and verified that I am speaking with the correct person using two identifiers.  Patient Location: Home  Provider Location: Other:  OFFICE  I discussed the limitations of evaluation and management by telemedicine. The patient expressed understanding and agreed to proceed.   Review of Systems    Defer to PCP Cardiac Risk Factors include: advanced age (>14mn, >>58women);male gender      03/31/2022    8:26 AM 03/30/2021    8:20 AM 03/26/2020    8:21 AM 12/06/2019    8:24 AM  Advanced Directives  Does Patient Have a Medical Advance Directive? No No No No  Would patient like information on creating a medical advance directive? No - Patient declined Yes (MAU/Ambulatory/Procedural Areas - Information given) No - Patient declined No - Patient declined    Current Medications (verified) Outpatient Encounter Medications as of 03/31/2022  Medication Sig   folic acid (FOLVITE) 4875MCG tablet Take 1 tablet by mouth daily.   Glucos-Chondroit-Hyaluron-MSM (GLUCOSAMINE CHONDROITIN JOINT PO) Take 2 tablets by mouth daily.   lisinopril (ZESTRIL) 20 MG tablet Take 1 tablet (20 mg total) by mouth daily.   Multiple Vitamins-Minerals (CENTRUM SILVER PO) Take 1 tablet by mouth daily.   propranolol ER (INDERAL LA) 60 MG 24 hr capsule Take 3 capsules (180 mg total) by mouth daily. 2 capsules in am and 1 in the pm   rosuvastatin (CRESTOR) 10 MG tablet Take 1 tablet (10 mg total) by mouth daily.   No facility-administered encounter medications on file as of 03/31/2022.    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   Hyperlipidemia     Hypertension    Prostate cancer ([place]    Stroke ([facility]    slight weakness in right foot   Past Surgical History:  Procedure Laterality Date   COLONOSCOPY WITH PROPOFOL N/A 12/06/2019   Procedure: COLONOSCOPY and biopsy WITH PROPOFOL;  Surgeon: WLucilla Lame MD;  [facility]  Service: Endoscopy;  Laterality: N/A;  priority 4   HERNIA REPAIR     kidney removal due to cancer Right    POLYPECTOMY N/A 12/06/2019   Procedure: POLYPECTOMY;  Surgeon: WLucilla Lame MD;  [facility]  Service: Endoscopy;  Laterality: N/A;   PROSTATE SURGERY     removed   Family History  Problem Relation Age of Onset   Cancer Father    Heart disease Father    Diabetes Sister    Stroke Maternal Grandmother    Heart disease Paternal Grandmother    Social History   Socioeconomic History   Marital status: Married    Spouse name: LKaiyon Hynes  Number of children: 0   Years of education: Not on file   Highest education level: Not on file  Occupational History   Not on file  Tobacco Use   Smoking status: Never    Passive exposure: Never   Smokeless tobacco: Never  Vaping Use   Vaping Use: Never used  Substance and Sexual Activity   Alcohol use: Yes    Comment: social   Drug use: Never   Sexual activity:  Not Currently  Other Topics Concern   Not on file  Social History Narrative   Not on file   Social Determinants of Health   Financial Resource Strain: Low Risk  (03/30/2021)   Overall Financial Resource Strain (CARDIA)    Difficulty of Paying Living Expenses: Not hard at all  Food Insecurity: No Food Insecurity (03/31/2022)   Hunger Vital Sign    Worried About Running Out of Food in the Last Year: Never true    Ran Out of Food in the Last Year: Never true  Transportation Needs: No Transportation Needs (03/31/2022)   PRAPARE - Hydrologist (Medical): No    Lack of Transportation (Non-Medical): No  Physical Activity: Sufficiently  Active (03/31/2022)   Exercise Vital Sign    Days of Exercise per Week: 7 days    Minutes of Exercise per Session: 80 min  Stress: No Stress Concern Present (03/31/2022)   [place]    Feeling of Stress : Not at all  Social Connections: Moderately Integrated (03/31/2022)   Social Connection and Isolation Panel [NHANES]    Frequency of Communication with Friends and Family: More than three times a week    Frequency of Social Gatherings with Friends and Family: More than three times a week    Attends Religious Services: More than 4 times per year    Active Member of [employer] or Organizations: No    Attends Archivist Meetings: Never    Marital Status: Married    Tobacco Counseling Counseling not needed.   Clinical Intake:  Pre-visit preparation completed: Yes  Pain : No/denies pain     Diabetes: No     Diabetic? No.  Interpreter Needed?: No  Information entered by :: Wyatt Haste, [place]   Activities of Daily Living    03/31/2022    8:26 AM  In your present state of health, do you have any difficulty performing the following activities:  Hearing? 0  Vision? 0  Difficulty concentrating or making decisions? 0  Walking or climbing stairs? 1  Dressing or bathing? 0  Doing errands, shopping? 0  Preparing Food and eating ? N  Using the Toilet? N  In the past six months, have you accidently leaked urine? Y  Do you have problems with loss of bowel control? N  Managing your Medications? N  Managing your Finances? N  Housekeeping or managing your Housekeeping? N    Patient Care Team: Juline Patch, MD as PCP - General (Family Medicine)  Indicate any recent Medical Services you may have received from other than [facility] providers in the past year (date may be approximate).     Assessment:   This is a routine wellness examination for Jason Krause.  Hearing/Vision screen Hearing Screening -  Comments:: No concerns. Vision Screening - Comments:: No concerns.  Dietary issues and exercise activities discussed: Current Exercise Habits: Home exercise routine   Goals Addressed   None   Depression Screen    03/31/2022    8:25 AM 10/12/2021    9:05 AM 04/27/2021    9:00 AM 03/30/2021    8:19 AM 01/09/2021    3:13 PM 11/06/2020   10:13 AM 10/23/2020    8:10 AM  PHQ 2/9 Scores  PHQ - 2 Score 0 0 0 0 0 0 0  PHQ- 9 Score 0 1 0  0 0 0    Fall Risk    03/31/2022  8:26 AM 10/12/2021    9:05 AM 03/30/2021    8:22 AM 01/09/2021    3:13 PM 11/06/2020   10:13 AM  [place] in the past year? 0 1 0 0 0  Number falls in past yr: 0 1 0 0 0  Injury with Fall? 0 0 0 0 0  Risk for fall due to : No Fall Risks History of fall(s) No Fall Risks No Fall Risks No Fall Risks  Follow up Falls evaluation completed Falls evaluation completed Falls prevention discussed Falls evaluation completed Falls evaluation completed    [place]:  Any stairs in or around the home? No  Home free of loose throw rugs in walkways, pet beds, electrical cords, etc? Yes  Adequate lighting in your home to reduce risk of falls? Yes   ASSISTIVE DEVICES UTILIZED TO PREVENT FALLS:  Life alert? No  Use of a cane, walker or w/c? No  Grab bars in the bathroom? No  Shower chair or bench in shower? No  Elevated toilet seat or a handicapped toilet? No   Cognitive Function:        03/31/2022    8:27 AM  6CIT Screen  What Year? 0 points  What month? 0 points  What time? 0 points  Count back from 20 0 points  Months in reverse 0 points  Repeat phrase 0 points  Total Score 0 points    Immunizations Immunization History  Administered Date(s) Administered   Influenza, High Dose Seasonal PF 01/27/2017, 01/17/2020   Influenza,inj,Quad PF,6+ Mos 02/18/2019   Influenza,inj,quad, With Preservative 12/18/2017   Influenza-Unspecified 04/19/2018, 01/17/2021   Moderna  Sars-Covid-2 Vaccination 05/25/2019, 06/26/2019, 04/02/2020   Pneumococcal Conjugate-13 04/19/2017   Pneumococcal Polysaccharide-23 05/08/2019   Tdap 04/19/2017    TDAP status: Up to date  Flu Vaccine status: Due, Education has been provided regarding the importance of this vaccine. Advised may receive this vaccine at local pharmacy or Health Dept. Aware to provide a copy of the vaccination record if obtained from local pharmacy or Health Dept. Verbalized acceptance and understanding.  Pneumococcal vaccine status: Up to date  Covid-19 vaccine status: Completed vaccines  Qualifies for Shingles Vaccine? Yes   Zostavax completed No   Shingrix Completed?: No.    Education has been provided regarding the importance of this vaccine. Patient has been advised to call insurance company to determine out of pocket expense if they have not yet received this vaccine. Advised may also receive vaccine at local pharmacy or Health Dept. Verbalized acceptance and understanding.  Screening Tests Health Maintenance  Topic Date Due   Hepatitis C Screening  Never done   Zoster Vaccines- Shingrix (1 of 2) Never done   INFLUENZA VACCINE  07/18/2022 (Originally 11/17/2021)   Medicare Annual Wellness (AWV)  04/01/2023   COLONOSCOPY (Pts 45-62yr Insurance coverage will need to be confirmed)  12/05/2024   DTaP/Tdap/Td (2 - Td or Tdap) 04/20/2027   Pneumonia Vaccine 75 Years old  Completed   HPV VACCINES  Aged Out   COVID-19 Vaccine  Discontinued    Health Maintenance  Health Maintenance Due  Topic Date Due   Hepatitis C Screening  Never done   Zoster Vaccines- Shingrix (1 of 2) Never done    Colorectal cancer screening: Type of screening: Colonoscopy. Completed 12/06/2019. Repeat every 5 years  Lung Cancer Screening: (Low Dose CT Chest recommended if Age 75-80years, 30 pack-year currently smoking OR have quit w/in 15years.) does  not qualify.   Additional Screening:  Hepatitis C Screening: does  qualify.  Vision Screening: Recommended annual ophthalmology exams for early detection of glaucoma and other disorders of the eye. Is the patient up to date with their annual eye exam?  Yes  Who is the provider or what is the name of the office in which the patient attends annual eye exams? Dr Ellin Mayhew in [place]: Recommended annual dental exams for proper oral hygiene  Community Resource Referral / Chronic Care Management: CRR required this visit?  No   CCM required this visit?  No      Plan:     I have personally reviewed and noted the following in the patient's chart:   Medical and social history Use of alcohol, tobacco or illicit drugs  Current medications and supplements including opioid prescriptions. Patient is not currently taking opioid prescriptions. Functional ability and status Nutritional status Physical activity Advanced directives List of other physicians Hospitalizations, surgeries, and ER visits in previous 12 months Vitals Screenings to include cognitive, depression, and falls Referrals and appointments  In addition, I have reviewed and discussed with patient certain preventive protocols, quality metrics, and best practice recommendations. A written personalized care plan for preventive services as well as general preventive health recommendations were provided to patient.     Clista Bernhardt, [place]   03/31/2022    Jason Krause , Thank you for taking time to come for your Medicare Wellness Visit. I appreciate your ongoing commitment to your health goals. Please review the following plan we discussed and let me know if I can assist you in the future.   These are the goals we discussed:  Goals      Increase physical activity     Recommend increasing physical activity to at least 90 minutes per week        This is a list of the screening recommended for you and due dates:  Health Maintenance  Topic Date Due   Hepatitis C  Screening: USPSTF Recommendation to screen - Ages 26-79 yo.  Never done   Zoster (Shingles) Vaccine (1 of 2) Never done   Flu Shot  07/18/2022*   Medicare Annual Wellness Visit  04/01/2023   Colon Cancer Screening  12/05/2024   DTaP/Tdap/Td vaccine (2 - Td or Tdap) 04/20/2027   Pneumonia Vaccine  Completed   HPV Vaccine  Aged Out   COVID-19 Vaccine  Discontinued  *Topic was postponed. The date shown is not the original due date.     Nurse Notes: None.

## 2022-03-31 ENCOUNTER — Ambulatory Visit (INDEPENDENT_AMBULATORY_CARE_PROVIDER_SITE_OTHER): Payer: Medicare PPO

## 2022-03-31 DIAGNOSIS — Z0000 Encounter for general adult medical examination without abnormal findings: Secondary | ICD-10-CM

## 2022-04-13 ENCOUNTER — Ambulatory Visit: Payer: Medicare PPO | Admitting: Family Medicine

## 2022-04-28 ENCOUNTER — Other Ambulatory Visit: Payer: Self-pay | Admitting: Family Medicine

## 2022-04-28 ENCOUNTER — Ambulatory Visit
Admission: EM | Admit: 2022-04-28 | Discharge: 2022-04-28 | Disposition: A | Discharge status: Home / Self Care | Payer: Medicare PPO

## 2022-04-28 ENCOUNTER — Encounter: Payer: Self-pay | Admitting: Emergency Medicine

## 2022-04-28 DIAGNOSIS — Z961 Presence of intraocular lens: Secondary | ICD-10-CM | POA: Diagnosis not present

## 2022-04-28 DIAGNOSIS — Z85528 Personal history of other malignant neoplasm of kidney: Secondary | ICD-10-CM | POA: Diagnosis not present

## 2022-04-28 DIAGNOSIS — R519 Headache, unspecified: Secondary | ICD-10-CM | POA: Diagnosis not present

## 2022-04-28 DIAGNOSIS — S01511A Laceration without foreign body of lip, initial encounter: Secondary | ICD-10-CM

## 2022-04-28 DIAGNOSIS — Z905 Acquired absence of kidney: Secondary | ICD-10-CM | POA: Diagnosis not present

## 2022-04-28 DIAGNOSIS — M47812 Spondylosis without myelopathy or radiculopathy, cervical region: Secondary | ICD-10-CM | POA: Diagnosis not present

## 2022-04-28 DIAGNOSIS — I129 Hypertensive chronic kidney disease with stage 1 through stage 4 chronic kidney disease, or unspecified chronic kidney disease: Secondary | ICD-10-CM | POA: Diagnosis not present

## 2022-04-28 DIAGNOSIS — S0990XA Unspecified injury of head, initial encounter: Secondary | ICD-10-CM | POA: Diagnosis not present

## 2022-04-28 DIAGNOSIS — Z8673 Personal history of transient ischemic attack (TIA), and cerebral infarction without residual deficits: Secondary | ICD-10-CM | POA: Diagnosis not present

## 2022-04-28 DIAGNOSIS — I10 Essential (primary) hypertension: Secondary | ICD-10-CM

## 2022-04-28 DIAGNOSIS — W19XXXA Unspecified fall, initial encounter: Secondary | ICD-10-CM

## 2022-04-28 DIAGNOSIS — S01112A Laceration without foreign body of left eyelid and periocular area, initial encounter: Secondary | ICD-10-CM

## 2022-04-28 DIAGNOSIS — Z8546 Personal history of malignant neoplasm of prostate: Secondary | ICD-10-CM | POA: Diagnosis not present

## 2022-04-28 DIAGNOSIS — S0181XA Laceration without foreign body of other part of head, initial encounter: Secondary | ICD-10-CM | POA: Diagnosis not present

## 2022-04-28 DIAGNOSIS — N183 Chronic kidney disease, stage 3 unspecified: Secondary | ICD-10-CM | POA: Diagnosis not present

## 2022-04-28 NOTE — Telephone Encounter (Signed)
Courtesy refill. Future visit in 1 month. Requested Prescriptions  Pending Prescriptions Disp Refills   propranolol ER (INDERAL LA) 60 MG 24 hr capsule [Pharmacy Med Name: PROPRANOLOL HCL ER 60 MG CAP] 270 capsule 0    Sig: TAKE 3 CAPSULES BY MOUTH DAILY. TAKE 2 CAPSULES BY MOUTH ONCE EVERY MORNING AND1 CAPSULE BY MOUTH ONCE EVERY EVENING     Cardiovascular:  Beta Blockers Failed - 04/28/2022  1:05 PM      Failed - Last BP in normal range    BP Readings from Last 1 Encounters:  12/01/21 (!) 139/91         Failed - Valid encounter within last 6 months    Recent Outpatient Visits           6 months ago Essential hypertension   [facility] [place], Deanna C, MD   1 year ago Essential hypertension   [place] [facility] [place], Deanna C, MD   1 year ago Acute maxillary sinusitis, recurrence not specified   [place] [facility] [place], Deanna C, MD   1 year ago Acute maxillary sinusitis, recurrence not specified   [place] [facility] [place], Deanna C, MD   1 year ago Essential hypertension    [facility] at [place], [place]as, MD       Future Appointments             In 1 month Juline Patch, MD [facility], [facility]   In 7 months Billey Co, MD [facility] [place]            Passed - Last Heart Rate in normal range    Pulse Readings from Last 1 Encounters:  12/01/21 (!) 56

## 2022-04-28 NOTE — ED Triage Notes (Addendum)
Pt states he fell while out walking about an hour ago. He has a laceration on his left eye, and his lower lip. Provider at bedside.

## 2022-04-28 NOTE — Discharge Instructions (Addendum)
-  As we discussed, you should go to the emergency department to have a CT scan performed of your head. - You also have a significant laceration of your lip that needs to be cleaned and repaired. - Please proceed to [place] immediately for further evaluation.  You have been advised to follow up immediately in the emergency department for concerning signs.symptoms. If you declined EMS transport, please have a family member take you directly to the ED at this time. Do not delay. Based on concerns about condition, if you do not follow up in th e ED, you may risk poor outcomes including worsening of condition, delayed treatment and potentially life threatening issues. If you have declined to go to the ED at this time, you should call your PCP immediately to set up a follow up appointment.  Go to ED for red flag symptoms, including; fevers you cannot reduce with Tylenol/Motrin, severe headaches, vision changes, numbness/weakness in part of the body, lethargy, confusion, intractable vomiting, severe dehydration, chest pain, breathing difficulty, severe persistent abdominal or pelvic pain, signs of severe infection (increased redness, swelling of an area), feeling faint or passing out, dizziness, etc. You should especially go to the ED for sudden acute worsening of condition if you do not elect to go at this time.

## 2022-04-28 NOTE — ED Notes (Signed)
Patient is being discharged from the Urgent Care and sent to the Emergency Department via POV . Per [employer], pa, patient is in need of higher level of care due to lip laceration, fall. Patient is aware and verbalizes understanding of plan of care.  Vitals:   04/28/22 1854  BP: (!) 151/101  Pulse: 76  Resp: 16  Temp: 98.5 F (36.9 C)  SpO2: 96%

## 2022-04-28 NOTE — ED Provider Notes (Signed)
Quick assessment performed.  Patient reports falling face first onto concrete about an hour ago.  He denies loss of consciousness.  He does have a significant medical history for parkinsonism, stroke, subarachnoid hemorrhage and prostate cancer.  He says the fall was accidental.  He says his legs became weak and he fell face forward.  He has a large laceration of his lower lip as well as a laceration of the left eyelid.  He is denying head injury, dizziness, vomiting.  His vitals are stable today.  I had a discussion with the patient about the importance of obtaining a CT scan in someone his age with a blunt head injury like he has sustained.  I am also concerned about the lacerations that he has especially the lip laceration as it is particularly deep.  Advised patient to proceed to the emergency department for further evaluation, laceration repair, CT scan.  He states that his wife will take him to [place].  He is leaving in stable condition at this time.   Danton Clap, PA-C 04/28/22 1912

## 2022-04-30 ENCOUNTER — Telehealth: Payer: Self-pay

## 2022-04-30 NOTE — Telephone Encounter (Signed)
Transition Care Management Follow-up Telephone Call Date of discharge and from where: 04/29/2022 [facility] How have you been since you were released from the hospital? Gypsum and Swollen Lip Any questions or concerns? No  Items Reviewed: Did the pt receive and understand the discharge instructions provided? Yes  Medications obtained and verified? No  Other? No  Any new allergies since your discharge? No  Dietary orders reviewed? Yes Do you have support at home? Yes   Home Care and Equipment/Supplies: Were home health services ordered? no   Functional Questionnaire: (I = Independent and D = Dependent) ADLs: I  Bathing/Dressing- I  Meal Prep- I  Eating- I  Maintaining continence- I  Transferring/Ambulation- I  Managing Meds- I  Follow up appointments reviewed:  PCP Hospital f/u appt confirmed? No   Are transportation arrangements needed? No  If their condition worsens, is the pt aware to call PCP or go to the Emergency Dept.? Yes Was the patient provided with contact information for the PCP's office or ED? Yes Was to pt encouraged to call back with questions or concerns? Yes

## 2022-05-28 DIAGNOSIS — M170 Bilateral primary osteoarthritis of knee: Secondary | ICD-10-CM | POA: Diagnosis not present

## 2022-06-03 DIAGNOSIS — G4733 Obstructive sleep apnea (adult) (pediatric): Secondary | ICD-10-CM | POA: Diagnosis not present

## 2022-06-04 ENCOUNTER — Ambulatory Visit: Payer: Medicare PPO | Admitting: Family Medicine

## 2022-06-04 ENCOUNTER — Encounter: Payer: Self-pay | Admitting: Family Medicine

## 2022-06-04 VITALS — BP 148/94 | HR 51 | Ht 69.0 in | Wt 201.0 lb

## 2022-06-04 DIAGNOSIS — E782 Mixed hyperlipidemia: Secondary | ICD-10-CM

## 2022-06-04 DIAGNOSIS — I10 Essential (primary) hypertension: Secondary | ICD-10-CM

## 2022-06-04 MED ORDER — LISINOPRIL 20 MG PO TABS: 20.0000 mg | ORAL_TABLET | Freq: Every day | ORAL | 1 refills | Status: DC

## 2022-06-04 MED ORDER — PROPRANOLOL HCL ER 60 MG PO CP24: ORAL_CAPSULE | ORAL | 1 refills | Status: DC

## 2022-06-04 MED ORDER — ROSUVASTATIN CALCIUM 10 MG PO TABS: 10.0000 mg | ORAL_TABLET | Freq: Every day | ORAL | 1 refills | Status: DC

## 2022-06-04 MED ORDER — AMLODIPINE BESYLATE 2.5 MG PO TABS: 2.5000 mg | ORAL_TABLET | Freq: Every day | ORAL | 0 refills | Status: DC

## 2022-06-04 NOTE — Progress Notes (Signed)
Date:  06/04/2022   Name:  Jason Krause   DOB:  1946/02/13   MRN:  [id number]   Chief Complaint: Hypertension and Hyperlipidemia  Hypertension This is a chronic problem. The current episode started more than 1 year ago. The problem has been gradually improving since onset. The problem is controlled. Pertinent negatives include no anxiety, blurred vision, chest pain, headaches, orthopnea, palpitations, peripheral edema, PND or shortness of breath. There are no associated agents to hypertension. Risk factors for coronary artery disease include dyslipidemia. Past treatments include ACE inhibitors and beta blockers. The current treatment provides moderate improvement. There are no compliance problems.  Hypertensive end-organ damage includes CVA. There is no history of angina or CAD/MI. There is no history of chronic renal disease, a hypertension causing med or renovascular disease.  Hyperlipidemia This is a chronic problem. The current episode started more than 1 year ago. The problem is controlled. Recent lipid tests were reviewed and are normal. He has no history of chronic renal disease. There are no known factors aggravating his hyperlipidemia. Pertinent negatives include no chest pain or shortness of breath. Current antihyperlipidemic treatment includes statins. The current treatment provides moderate improvement of lipids.    Lab Results  Component Value Date   NA 144 12/16/2021   K 4.6 12/16/2021   CO2 24 12/16/2021   GLUCOSE 84 12/16/2021   BUN 20 12/16/2021   CREATININE 1.47 (H) 12/16/2021   CALCIUM 10.0 12/16/2021   EGFR 49 (L) 12/16/2021   GFRNONAA 48 (L) 04/23/2020   Lab Results  Component Value Date   CHOL 140 12/16/2021   HDL 42 12/16/2021   LDLCALC 83 12/16/2021   TRIG 74 12/16/2021   CHOLHDL 3.6 11/06/2019   No results found for: "TSH" No results found for: "HGBA1C" No results found for: "WBC", "HGB", "HCT", "MCV", "PLT" Lab Results  Component Value Date   ALT 15  12/16/2021   AST 21 12/16/2021   ALKPHOS 91 12/16/2021   BILITOT 0.9 12/16/2021   No results found for: "25OHVITD2", "25OHVITD3", "VD25OH"   Review of Systems  Constitutional:  Negative for fatigue and unexpected weight change.  HENT:  Negative for trouble swallowing.   Eyes:  Negative for blurred vision and visual disturbance.  Respiratory:  Negative for chest tightness, shortness of breath and wheezing.   Cardiovascular:  Negative for chest pain, palpitations, orthopnea, leg swelling and PND.  Gastrointestinal:  Negative for abdominal pain.  Endocrine: Negative for polydipsia and polyuria.  Genitourinary:  Negative for difficulty urinating.  Musculoskeletal:  Negative for arthralgias.  Neurological:  Negative for headaches.  Hematological:  Negative for adenopathy. Does not bruise/bleed easily.    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   Pain in both knees 10/29/2020   Essential tremor 10/29/2020   Leg weakness, bilateral 09/02/2020   Leg pain 06/22/2020   Personal history of colonic polyps    Polyp of descending colon    CKD (chronic kidney disease) stage 3, GFR 30-59 ml/min ([place]) 12/28/2017   Osteopenia 12/28/2017   Polyp of gallbladder 12/28/2017   Right bundle branch block 12/28/2017   Chronic kidney disease, stage 3 ([place]) 12/28/2017   SAH (subarachnoid hemorrhage) ([place]) 11/11/2016   Essential hypertension 11/09/2016   Mixed hyperlipidemia 11/08/2016   Nontraumatic cortical hemorrhage of left cerebral hemisphere ([place]) 11/08/2016   Personal history of renal cell carcinoma 11/08/2016   Cerebrovascular accident (  [place]) 10/18/2016   History of kidney cancer 01/19/2007   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[place]) 01/12/2001    Allergies  Allergen Reactions   Carvedilol Other (See Comments)    "makes me a zombie" Other reaction(s): Abdominal Pain, Hallucination Lethargic     Simvastatin     Other reaction(s): Muscle Pain    Past Surgical History:  Procedure Laterality Date   COLONOSCOPY WITH PROPOFOL N/A 12/06/2019   Procedure: COLONOSCOPY and biopsy WITH PROPOFOL;  Surgeon: Lucilla Lame, MD;  Location: [place];  Service: Endoscopy;  Laterality: N/A;  priority 4   HERNIA REPAIR     kidney removal due to cancer Right    POLYPECTOMY N/A 12/06/2019   Procedure: POLYPECTOMY;  Surgeon: Lucilla Lame, MD;  Location: [place];  Service: Endoscopy;  Laterality: N/A;   PROSTATE SURGERY     removed    Social History   Tobacco Use   Smoking status: Never    Passive exposure: Never   Smokeless tobacco: Never  Vaping Use   Vaping Use: Never used  Substance Use Topics   Alcohol use: Yes    Comment: social   Drug use: Never     Medication list has been reviewed and updated.  Current Meds  Medication Sig   folic acid (FOLVITE) A999333 MCG tablet Take 1 tablet by mouth daily.   Glucos-Chondroit-Hyaluron-MSM (GLUCOSAMINE CHONDROITIN JOINT PO) Take 2 tablets by mouth daily.   lisinopril (ZESTRIL) 20 MG tablet Take 1 tablet (20 mg total) by mouth daily.   Multiple Vitamins-Minerals (CENTRUM SILVER PO) Take 1 tablet by mouth daily.   propranolol ER (INDERAL LA) 60 MG 24 hr capsule TAKE 3 CAPSULES BY MOUTH DAILY. TAKE 2 CAPSULES BY MOUTH ONCE EVERY MORNING AND1 CAPSULE BY MOUTH ONCE EVERY EVENING   rosuvastatin (CRESTOR) 10 MG tablet Take 1 tablet (10 mg total) by mouth daily.       06/04/2022    9:32 AM 10/12/2021    9:05 AM 04/27/2021    9:01 AM 01/09/2021    3:13 PM  GAD 7 : Generalized Anxiety Score  Nervous, Anxious, on Edge 0 0 0 0  Control/stop worrying 0 0 0 0  Worry too much - different things 0 0 0 0  Trouble relaxing 0 0 0 0  Restless 0 0 0 0  Easily annoyed or irritable 0 0 0 0  Afraid - awful might happen 0  0 0  Total GAD 7 Score 0  0 0  Anxiety Difficulty Not difficult at all Not difficult at all Not difficult at all         06/04/2022    9:32 AM 03/31/2022    8:25 AM 10/12/2021    9:05 AM  Depression screen PHQ 2/9  Decreased Interest 0 0 0  Down, Depressed, Hopeless 0 0 0  PHQ - 2 Score 0 0 0  Altered sleeping 0 0 0  Tired, decreased energy 0 0 1  Change in appetite 0 0 0  Feeling bad or failure about yourself  0 0 0  Trouble concentrating 0 0 0  Moving slowly or fidgety/restless 0 0 0  Suicidal thoughts 0 0 0  PHQ-9 Score 0 0 1  Difficult doing work/chores Not difficult at all Not difficult at all     BP Readings from Last 3 Encounters:  06/04/22 (!) 144/100  04/28/22 (!) 151/101  12/01/21 (!) 139/91    Physical Exam Vitals and nursing note reviewed.  HENT:  Head: Normocephalic.     Right Ear: External ear normal.     Left Ear: External ear normal.     Nose: Nose normal.  Eyes:     General: No scleral icterus.       Right eye: No discharge.        Left eye: No discharge.     Conjunctiva/sclera: Conjunctivae normal.     Pupils: Pupils are equal, round, and reactive to light.  Neck:     Thyroid: No thyromegaly.     Vascular: No JVD.     Trachea: No tracheal deviation.  Cardiovascular:     Rate and Rhythm: Normal rate and regular rhythm.     Heart sounds: Normal heart sounds, S1 normal and S2 normal. No murmur heard.    No systolic murmur is present.     No diastolic murmur is present.     No friction rub. No gallop. No S3 or S4 sounds.  Pulmonary:     Effort: No respiratory distress.     Breath sounds: Normal breath sounds. No wheezing or rales.  Abdominal:     Palpations: Abdomen is soft. There is no mass.     Tenderness: There is no abdominal tenderness. There is no guarding or rebound.  Musculoskeletal:        General: No tenderness.     Cervical back: Neck supple.  Lymphadenopathy:     Cervical: No cervical adenopathy.  Skin:    General: Skin is warm.     Findings: No rash.  Neurological:     Mental Status: He is alert.     Wt Readings from Last 3  Encounters:  06/04/22 201 lb (91.2 kg)  04/28/22 194 lb 0.1 oz (88 kg)  12/01/21 194 lb (88 kg)    BP (!) 144/100 (BP Location: Right Arm, Cuff Size: Large)   Pulse (!) 51   Ht 5' 9"$  (1.753 m)   Wt 201 lb (91.2 kg)   SpO2 96%   BMI 29.68 kg/m   Assessment and Plan:  1. Essential hypertension Chronic.  Uncontrolled because of medication has not been taking this morning.  Stable.  Currently is on lisinopril 20 mg once a day and propranolol ER 63 capsules by mouth 125 morning 1 capsule by mouth in the evening.  Seen at patient's had elevated readings at home even before coming today.  Added amlodipine 2.5 mg once a day and will check renal function panel for electrolytes and GFR.  Will recheck patient in 10 weeks for blood pressure control and encourage low-sodium intake. - lisinopril (ZESTRIL) 20 MG tablet; Take 1 tablet (20 mg total) by mouth daily.  Dispense: 90 tablet; Refill: 1 - propranolol ER (INDERAL LA) 60 MG 24 hr capsule; TAKE 3 CAPSULES BY MOUTH DAILY. TAKE 2 CAPSULES BY MOUTH ONCE EVERY MORNING AND1 CAPSULE BY MOUTH ONCE EVERY EVENING  Dispense: 270 capsule; Refill: 1 - Renal Function Panel - amLODipine (NORVASC) 2.5 MG tablet; Take 1 tablet (2.5 mg total) by mouth daily.  Dispense: 90 tablet; Refill: 0  2. Mixed hyperlipidemia Chronic.  Controlled.  Stable.  Tolerating current statin and will continue rosuvastatin 10 mg once a day.  Will check lipid panel for current cholesterol triglyceride levels. - rosuvastatin (CRESTOR) 10 MG tablet; Take 1 tablet (10 mg total) by mouth daily.  Dispense: 90 tablet; Refill: 1 - Lipid Panel With LDL/HDL Ratio    Otilio Miu, MD

## 2022-06-05 LAB — LIPID PANEL WITH LDL/HDL RATIO
Cholesterol, Total: 157 mg/dL (ref 100–199)
HDL: 48 mg/dL (ref >39)
LDL Chol Calc (NIH): 97 mg/dL (ref 0–99)
LDL/HDL Ratio: 2 ratio (ref 0.0–3.6)
Triglycerides: 58 mg/dL (ref 0–149)
VLDL Cholesterol Cal: 12 mg/dL (ref 5–40)

## 2022-06-05 LAB — RENAL FUNCTION PANEL
Albumin: 4.3 g/dL (ref 3.8–4.8)
BUN/Creatinine Ratio: 16 (ref 10–24)
BUN: 23 mg/dL (ref 8–27)
CO2: 24 mmol/L (ref 20–29)
Calcium: 10.3 mg/dL — ABNORMAL HIGH (ref 8.6–10.2)
Chloride: 105 mmol/L (ref 96–106)
Creatinine, Ser: 1.44 mg/dL — ABNORMAL HIGH (ref 0.76–1.27)
Glucose: 90 mg/dL (ref 70–99)
Phosphorus: 3.3 mg/dL (ref 2.8–4.1)
Potassium: 5.4 mmol/L — ABNORMAL HIGH (ref 3.5–5.2)
Sodium: 143 mmol/L (ref 134–144)
eGFR: 51 mL/min/{1.73_m2} — ABNORMAL LOW (ref >59)

## 2022-06-07 NOTE — Progress Notes (Signed)
PC to pt, discussed lab results, pt voiced understanding.

## 2022-07-07 ENCOUNTER — Ambulatory Visit
Admission: RE | Admit: 2022-07-07 | Discharge: 2022-07-07 | Disposition: A | Discharge status: Home / Self Care | Payer: Medicare PPO | Source: Ambulatory Visit | Attending: Family Medicine | Admitting: Family Medicine

## 2022-07-07 ENCOUNTER — Ambulatory Visit: Payer: Medicare PPO | Admitting: Family Medicine

## 2022-07-07 ENCOUNTER — Encounter: Payer: Self-pay | Admitting: Family Medicine

## 2022-07-07 ENCOUNTER — Ambulatory Visit
Admission: RE | Admit: 2022-07-07 | Discharge: 2022-07-07 | Disposition: A | Discharge status: Home / Self Care | Payer: Medicare PPO | Attending: Family Medicine | Admitting: Family Medicine

## 2022-07-07 VITALS — BP 118/68 | HR 60 | Ht 69.0 in | Wt 186.0 lb

## 2022-07-07 DIAGNOSIS — M7022 Olecranon bursitis, left elbow: Secondary | ICD-10-CM | POA: Insufficient documentation

## 2022-07-07 DIAGNOSIS — M25522 Pain in left elbow: Secondary | ICD-10-CM | POA: Diagnosis not present

## 2022-07-07 DIAGNOSIS — M7989 Other specified soft tissue disorders: Secondary | ICD-10-CM | POA: Diagnosis not present

## 2022-07-07 NOTE — Progress Notes (Signed)
Date:  07/07/2022   Name:  Jason Krause   DOB:  [birth year]   MRN:  [id number]   Chief Complaint: Elbow Injury (Swelling 1 month after a fall in L) elbow)  Arm Pain  The incident occurred more than 1 week ago (6 weeks). The injury mechanism was a fall. The pain is present in the left elbow. The quality of the pain is described as aching. The pain is mild. The pain has been Improving since the incident. Pertinent negatives include no chest pain, numbness or tingling.    Lab Results  Component Value Date   NA 143 06/04/2022   K 5.4 (H) 06/04/2022   CO2 24 06/04/2022   GLUCOSE 90 06/04/2022   BUN 23 06/04/2022   CREATININE 1.44 (H) 06/04/2022   CALCIUM 10.3 (H) 06/04/2022   EGFR 51 (L) 06/04/2022   GFRNONAA 48 (L) 04/23/2020   Lab Results  Component Value Date   CHOL 157 06/04/2022   HDL 48 06/04/2022   LDLCALC 97 06/04/2022   TRIG 58 06/04/2022   CHOLHDL 3.6 11/06/2019   No results found for: "TSH" No results found for: "HGBA1C" No results found for: "WBC", "HGB", "HCT", "MCV", "PLT" Lab Results  Component Value Date   ALT 15 12/16/2021   AST 21 12/16/2021   ALKPHOS 91 12/16/2021   BILITOT 0.9 12/16/2021   No results found for: "25OHVITD2", "25OHVITD3", "VD25OH"   Review of Systems  Constitutional:  Negative for chills and fever.  HENT:  Negative for drooling, ear discharge, ear pain and sore throat.   Respiratory:  Negative for cough, shortness of breath and wheezing.   Cardiovascular:  Negative for chest pain, palpitations and leg swelling.  Gastrointestinal:  Negative for abdominal pain, blood in stool, constipation, diarrhea and nausea.  Endocrine: Negative for polydipsia.  Genitourinary:  Negative for dysuria, frequency, hematuria and urgency.  Musculoskeletal:  Negative for back pain, myalgias and neck pain.  Skin:  Negative for rash.  Allergic/Immunologic: Negative for environmental allergies.  Neurological:  Negative for dizziness, tingling, numbness and  headaches.  Hematological:  Does not bruise/bleed easily.  Psychiatric/Behavioral:  Negative for suicidal ideas. The patient is not nervous/anxious.     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) 12/28/2017   SAH (subarachnoid hemorrhage) ([place]) 11/11/2016   Essential hypertension 11/09/2016   Mixed hyperlipidemia 11/08/2016   Nontraumatic cortical hemorrhage of left cerebral hemisphere ([place]) 11/08/2016   Personal history of renal cell carcinoma 11/08/2016   Cerebrovascular accident ([place]) 10/18/2016   History of kidney cancer 01/19/2007   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[place]) 01/12/2001    Allergies  Allergen Reactions   Carvedilol Other (See Comments)    "makes me a zombie" Other reaction(s): Abdominal Pain, Hallucination Lethargic    Simvastatin     Other reaction(s): Muscle Pain    Past Surgical History:  Procedure Laterality Date   COLONOSCOPY WITH PROPOFOL N/A 12/06/2019   Procedure: COLONOSCOPY and biopsy WITH PROPOFOL;  Surgeon: Lucilla Lame, MD;  Location: [place];  Service: Endoscopy;  Laterality: N/A;  priority 4   HERNIA REPAIR     kidney removal due to cancer Right    POLYPECTOMY N/A 12/06/2019   Procedure: POLYPECTOMY;  Surgeon: Lucilla Lame, MD;  Location: [place];  Service: Endoscopy;  Laterality: N/A;   PROSTATE SURGERY     removed    Social History   Tobacco Use   Smoking status: Never    Passive exposure: Never   Smokeless tobacco: Never   Vaping Use   Vaping Use: Never used  Substance Use Topics   Alcohol use: Yes    Comment: social   Drug use: Never     Medication list has been reviewed and updated.  Current Meds  Medication Sig   amLODipine (NORVASC) 2.5 MG tablet Take 1 tablet (2.5 mg total) by mouth daily.   folic acid (FOLVITE) A999333 MCG tablet Take 1 tablet by mouth daily.   Glucos-Chondroit-Hyaluron-MSM (GLUCOSAMINE CHONDROITIN JOINT PO) Take 2 tablets by mouth daily.   lisinopril (ZESTRIL) 20 MG tablet Take 1 tablet (20 mg total) by mouth daily.   Multiple Vitamins-Minerals (CENTRUM SILVER PO) Take 1 tablet by mouth daily.   propranolol ER (INDERAL LA) 60 MG 24 hr capsule TAKE 3 CAPSULES BY MOUTH DAILY. TAKE 2 CAPSULES BY MOUTH ONCE EVERY MORNING AND1 CAPSULE BY MOUTH ONCE EVERY EVENING   rosuvastatin (CRESTOR) 10 MG tablet Take 1 tablet (10 mg total) by mouth daily.       07/07/2022   11:20 AM 06/04/2022    9:32 AM 10/12/2021    9:05 AM 04/27/2021    9:01 AM  GAD 7 : Generalized Anxiety Score  Nervous, Anxious, on Edge 0 0 0 0  Control/stop worrying 0 0 0 0  Worry too much - different things 0 0 0 0  Trouble relaxing 0 0 0 0  Restless 0 0 0 0  Easily annoyed or irritable 0 0 0 0  Afraid - awful might happen 0 0  0  Total GAD 7 Score 0 0  0  Anxiety Difficulty Not difficult at all Not difficult at all Not difficult at all Not difficult at all       07/07/2022   11:20 AM 06/04/2022    9:32 AM 03/31/2022    8:25 AM  Depression screen PHQ 2/9  Decreased Interest 0 0 0  Down, Depressed, Hopeless 0 0 0  PHQ - 2 Score 0 0 0  Altered sleeping 0 0 0  Tired, decreased energy 0 0 0  Change in appetite 0 0 0  Feeling bad or failure about yourself  0 0 0  Trouble concentrating 0 0 0  Moving slowly or fidgety/restless 0 0 0  Suicidal thoughts 0 0 0  PHQ-9 Score 0 0 0  Difficult doing work/chores Not difficult at all Not difficult at all Not difficult at all    BP Readings from Last 3 Encounters:   07/07/22 118/68  06/04/22 (!) 148/94  04/28/22 (!) 151/101    Physical Exam Vitals and nursing note reviewed.  HENT:     Head: Normocephalic.     Right Ear: External ear normal.     Left Ear: External ear normal.     Nose: Nose normal.  Eyes:     General: No scleral icterus.       Right eye: No discharge.        Left eye: No discharge.  Conjunctiva/sclera: Conjunctivae normal.     Pupils: Pupils are equal, round, and reactive to light.  Neck:     Thyroid: No thyromegaly.     Vascular: No JVD.     Trachea: No tracheal deviation.  Cardiovascular:     Rate and Rhythm: Normal rate and regular rhythm.     Heart sounds: Normal heart sounds. No murmur heard.    No friction rub. No gallop.  Pulmonary:     Effort: No respiratory distress.     Breath sounds: Normal breath sounds. No wheezing or rales.  Abdominal:     General: Bowel sounds are normal.     Palpations: Abdomen is soft. There is no mass.     Tenderness: There is no abdominal tenderness. There is no guarding or rebound.  Musculoskeletal:        General: Normal range of motion.     Left elbow: No swelling, deformity or effusion. Normal range of motion. Tenderness present in medial epicondyle.     Cervical back: Normal range of motion and neck supple.     Comments: Swelling olecranon bursa  Lymphadenopathy:     Cervical: No cervical adenopathy.  Skin:    General: Skin is warm.     Findings: No rash.  Neurological:     Mental Status: He is alert.     Sensory: Sensation is intact.     Motor: Motor function is intact.     Deep Tendon Reflexes: Reflexes are normal and symmetric.     Wt Readings from Last 3 Encounters:  07/07/22 186 lb (84.4 kg)  06/04/22 201 lb (91.2 kg)  04/28/22 194 lb 0.1 oz (88 kg)    BP 118/68   Pulse 60   Ht 5\' 9"  (1.753 m)   Wt 186 lb (84.4 kg)   SpO2 98%   BMI 27.47 kg/m   Assessment and Plan:  1. Olecranon bursitis of left elbow New onset over the past week with swelling  of the left olecranon bursa.  Approximately 6 weeks ago he sustained a fall with some residual ecchymosis noted on the medial aspect.  There is tenderness at the medial epicondyle.  But no pain with varus valgus stress.  Neurologic exam is normal will obtain x-ray to rule out any avulsion fracture in the meantime we will use compression on the olecranon bursa patient is unable to take NSAIDs secondary to kidney concerns.  Patient's been instructed use an ice cube over the olecranon bursa only to decrease the swelling and we are planning on being evaluated in sports medicine next week following x-ray as noted above. - DG Elbow Complete Left    Otilio Miu, MD

## 2022-07-12 NOTE — Progress Notes (Signed)
PC to pt, LVM for xray results. Pt is seeing Dr. Zigmund Daniel 07/13/22 will discuss then.

## 2022-07-13 ENCOUNTER — Ambulatory Visit (INDEPENDENT_AMBULATORY_CARE_PROVIDER_SITE_OTHER): Payer: Medicare PPO | Admitting: Family Medicine

## 2022-07-13 ENCOUNTER — Other Ambulatory Visit (INDEPENDENT_AMBULATORY_CARE_PROVIDER_SITE_OTHER): Payer: Medicare PPO | Admitting: Radiology

## 2022-07-13 ENCOUNTER — Encounter: Payer: Self-pay | Admitting: Family Medicine

## 2022-07-13 VITALS — BP 118/76 | HR 68 | Ht 69.0 in

## 2022-07-13 DIAGNOSIS — M7022 Olecranon bursitis, left elbow: Secondary | ICD-10-CM

## 2022-07-13 MED ORDER — TRIAMCINOLONE ACETONIDE 40 MG/ML IJ SUSP
40.0000 mg | Freq: Once | INTRAMUSCULAR | Status: AC
  Administered 2022-07-13: 40 mg via INTRAMUSCULAR

## 2022-07-13 NOTE — Patient Instructions (Signed)
You have just been given a cortisone injection to reduce pain and inflammation after fluid was drawn off. After the injection you may notice immediate relief of pain as a result of the Lidocaine. It is important to rest the area of the injection for 24 to 48 hours after the injection. There is a possibility of some temporary increased discomfort and swelling for up to 72 hours until the cortisone begins to work. If you do have pain, simply rest the joint and use ice. If you can tolerate over the counter medications, you can try Tylenol, Aleve, or Advil for added relief per package instructions.  - As above, take relative rest x 1 week - Use ACE wrap throughout the day and night to keep compression, adjust the wrap for comfort and circulation, loosen at night  - Okay to remove ACE wrap for showering - Contact for questions and follow-up as-needed

## 2022-07-19 DIAGNOSIS — M7022 Olecranon bursitis, left elbow: Secondary | ICD-10-CM | POA: Insufficient documentation

## 2022-07-19 NOTE — Assessment & Plan Note (Signed)
10 day prior trauma to left elbow, swelling, no drainage erythema, fevers, chills. Has been stable, pain controlled.  Examination with painless full elbow ROM, no crepitus, he has a swollen olecranon bursa.  Plan as follows after review of treatment options: - Aspirated olecranon bursa and injected triamcinolone - Advised post-care including compression and activity restrictions - Follow-up as-needed

## 2022-07-19 NOTE — Progress Notes (Signed)
     Primary Care / Sports Medicine Office Visit  Patient Information:  Patient ID: Jason Krause, male DOB: April 14, 1946 Age: 77 y.o. MRN: [id number]   Jason Krause is a pleasant 77 y.o. male presenting with the following:  Chief Complaint  Patient presents with   Olecranon bursitis of left elbow    10 days, hit it, did xray no meds    Vitals:   07/13/22 1051  BP: 118/76  Pulse: 68  SpO2: 94%   Vitals:   07/13/22 1051  Height: 5\' 9"  (1.753 m)   Body mass index is 27.47 kg/m.  DG Elbow Complete Left  Result Date: 07/10/2022 CLINICAL DATA:  Pain after fall.  Bursitis for 1 month EXAM: LEFT ELBOW - COMPLETE 5 VIEW COMPARISON:  None Available. FINDINGS: No fracture or dislocation. Preserved joint spaces and bone mineralization. There is dorsal soft tissue thickening at the level of the olecranon. Please correlate for history of bursitis. IMPRESSION: No acute osseous abnormality. Dorsal soft tissue swelling at the level of the olecranon consistent with the history of bursitis Electronically Signed   By: Jill Side M.D.   On: 07/10/2022 11:00     Independent interpretation of notes and tests performed by another provider:   Independent interpretation of left elbow x-rays with subtle diffuse degenerative changes, there is soft tissue shadow consistent with bursitis.  Procedures performed:   Procedure:  Injection after aspiration of left elbow bursa under ultrasound guidance. Ultrasound guidance utilized to evaluate area of olecranon fluctuance and confirm full aspiration Samsung HS60 device utilized with permanent recording / reporting. Verbal informed consent obtained and verified. Skin prepped in a sterile fashion. Ethyl chloride for topical local analgesia.  Completed without difficulty and tolerated well. Aspirate: 10 mL straw-colored non-inflammatory fluid Medication: triamcinolone acetonide 40 mg/mL suspension for injection 1 mL total and 2 mL lidocaine 1% without  epinephrine utilized for needle placement anesthetic Advised to contact for fevers/chills, erythema, induration, drainage, or persistent bleeding.   Pertinent History, Exam, Impression, and Recommendations:   Jason Krause was seen today for olecranon bursitis of left elbow.  Olecranon bursitis, left elbow Assessment & Plan: 10 day prior trauma to left elbow, swelling, no drainage erythema, fevers, chills. Has been stable, pain controlled.  Examination with painless full elbow ROM, no crepitus, he has a swollen olecranon bursa.  Plan as follows after review of treatment options: - Aspirated olecranon bursa and injected triamcinolone - Advised post-care including compression and activity restrictions - Follow-up as-needed  Orders: -     [place] LIMITED JOINT SPACE STRUCTURES UP LEFT; Future -     Triamcinolone Acetonide     Orders & Medications Meds ordered this encounter  Medications   triamcinolone acetonide (KENALOG-40) injection 40 mg   Orders Placed This Encounter  Procedures   [place] LIMITED JOINT SPACE STRUCTURES UP LEFT     No follow-ups on file.     Montel Culver, MD, [facility]   Primary Care Sports Medicine Primary Care and Sports Medicine at [facility]

## 2022-07-26 ENCOUNTER — Encounter: Payer: Self-pay | Admitting: Emergency Medicine

## 2022-07-26 ENCOUNTER — Emergency Department: Payer: Medicare PPO

## 2022-07-26 ENCOUNTER — Other Ambulatory Visit: Payer: Self-pay

## 2022-07-26 ENCOUNTER — Inpatient Hospital Stay
Admission: EM | Admit: 2022-07-26 | Discharge: 2022-07-28 | DRG: 641 | Disposition: A | Discharge status: Home / Self Care | Payer: Medicare PPO | Attending: Student | Admitting: Student

## 2022-07-26 ENCOUNTER — Telehealth: Payer: Self-pay | Admitting: Family Medicine

## 2022-07-26 ENCOUNTER — Telehealth: Payer: Self-pay

## 2022-07-26 DIAGNOSIS — I4510 Unspecified right bundle-branch block: Secondary | ICD-10-CM | POA: Diagnosis present

## 2022-07-26 DIAGNOSIS — N1831 Chronic kidney disease, stage 3a: Secondary | ICD-10-CM | POA: Diagnosis present

## 2022-07-26 DIAGNOSIS — K5730 Diverticulosis of large intestine without perforation or abscess without bleeding: Secondary | ICD-10-CM | POA: Diagnosis not present

## 2022-07-26 DIAGNOSIS — Z823 Family history of stroke: Secondary | ICD-10-CM

## 2022-07-26 DIAGNOSIS — R112 Nausea with vomiting, unspecified: Secondary | ICD-10-CM

## 2022-07-26 DIAGNOSIS — R55 Syncope and collapse: Principal | ICD-10-CM | POA: Diagnosis present

## 2022-07-26 DIAGNOSIS — I10 Essential (primary) hypertension: Secondary | ICD-10-CM | POA: Diagnosis not present

## 2022-07-26 DIAGNOSIS — N281 Cyst of kidney, acquired: Secondary | ICD-10-CM | POA: Diagnosis present

## 2022-07-26 DIAGNOSIS — Z1152 Encounter for screening for COVID-19: Secondary | ICD-10-CM

## 2022-07-26 DIAGNOSIS — Z833 Family history of diabetes mellitus: Secondary | ICD-10-CM

## 2022-07-26 DIAGNOSIS — Z905 Acquired absence of kidney: Secondary | ICD-10-CM

## 2022-07-26 DIAGNOSIS — K8020 Calculus of gallbladder without cholecystitis without obstruction: Secondary | ICD-10-CM | POA: Diagnosis not present

## 2022-07-26 DIAGNOSIS — R4182 Altered mental status, unspecified: Secondary | ICD-10-CM | POA: Diagnosis not present

## 2022-07-26 DIAGNOSIS — R110 Nausea: Secondary | ICD-10-CM | POA: Diagnosis not present

## 2022-07-26 DIAGNOSIS — Z79899 Other long term (current) drug therapy: Secondary | ICD-10-CM

## 2022-07-26 DIAGNOSIS — R42 Dizziness and giddiness: Secondary | ICD-10-CM | POA: Diagnosis not present

## 2022-07-26 DIAGNOSIS — R001 Bradycardia, unspecified: Secondary | ICD-10-CM | POA: Diagnosis not present

## 2022-07-26 DIAGNOSIS — N183 Chronic kidney disease, stage 3 unspecified: Secondary | ICD-10-CM | POA: Diagnosis present

## 2022-07-26 DIAGNOSIS — Q6102 Congenital multiple renal cysts: Secondary | ICD-10-CM

## 2022-07-26 DIAGNOSIS — R81 Glycosuria: Secondary | ICD-10-CM | POA: Diagnosis not present

## 2022-07-26 DIAGNOSIS — I129 Hypertensive chronic kidney disease with stage 1 through stage 4 chronic kidney disease, or unspecified chronic kidney disease: Secondary | ICD-10-CM | POA: Diagnosis present

## 2022-07-26 DIAGNOSIS — G250 Essential tremor: Secondary | ICD-10-CM | POA: Diagnosis present

## 2022-07-26 DIAGNOSIS — E785 Hyperlipidemia, unspecified: Secondary | ICD-10-CM | POA: Diagnosis present

## 2022-07-26 DIAGNOSIS — Z85528 Personal history of other malignant neoplasm of kidney: Secondary | ICD-10-CM

## 2022-07-26 DIAGNOSIS — A084 Viral intestinal infection, unspecified: Secondary | ICD-10-CM | POA: Diagnosis present

## 2022-07-26 DIAGNOSIS — N1330 Unspecified hydronephrosis: Secondary | ICD-10-CM | POA: Diagnosis not present

## 2022-07-26 DIAGNOSIS — E860 Dehydration: Secondary | ICD-10-CM | POA: Diagnosis not present

## 2022-07-26 DIAGNOSIS — Z8249 Family history of ischemic heart disease and other diseases of the circulatory system: Secondary | ICD-10-CM

## 2022-07-26 DIAGNOSIS — Z8546 Personal history of malignant neoplasm of prostate: Secondary | ICD-10-CM

## 2022-07-26 DIAGNOSIS — Z8673 Personal history of transient ischemic attack (TIA), and cerebral infarction without residual deficits: Secondary | ICD-10-CM

## 2022-07-26 LAB — URINALYSIS, ROUTINE W REFLEX MICROSCOPIC
Bilirubin Urine: NEGATIVE
Glucose, UA: 50 mg/dL — AB
Hgb urine dipstick: NEGATIVE
Ketones, ur: 5 mg/dL — AB
Leukocytes,Ua: NEGATIVE
Nitrite: NEGATIVE
Protein, ur: NEGATIVE mg/dL
Specific Gravity, Urine: 1.018 (ref 1.005–1.030)
pH: 8 (ref 5.0–8.0)

## 2022-07-26 LAB — CBC WITH DIFFERENTIAL/PLATELET
Abs Immature Granulocytes: 0.07 10*3/uL (ref 0.00–0.07)
Basophils Absolute: 0.1 10*3/uL (ref 0.0–0.1)
Basophils Relative: 0 %
Eosinophils Absolute: 0.1 10*3/uL (ref 0.0–0.5)
Eosinophils Relative: 1 %
HCT: 46.3 % (ref 39.0–52.0)
Hemoglobin: 15.4 g/dL (ref 13.0–17.0)
Immature Granulocytes: 1 %
Lymphocytes Relative: 8 %
Lymphs Abs: 1.1 10*3/uL (ref 0.7–4.0)
MCH: 32.8 pg (ref 26.0–34.0)
MCHC: 33.3 g/dL (ref 30.0–36.0)
MCV: 98.5 fL (ref 80.0–100.0)
Monocytes Absolute: 0.7 10*3/uL (ref 0.1–1.0)
Monocytes Relative: 6 %
Neutro Abs: 11 10*3/uL — ABNORMAL HIGH (ref 1.7–7.7)
Neutrophils Relative %: 84 %
Platelets: 144 10*3/uL — ABNORMAL LOW (ref 150–400)
RBC: 4.7 MIL/uL (ref 4.22–5.81)
RDW: 12.8 % (ref 11.5–15.5)
WBC: 13.1 10*3/uL — ABNORMAL HIGH (ref 4.0–10.5)
nRBC: 0 % (ref 0.0–0.2)

## 2022-07-26 LAB — COMPREHENSIVE METABOLIC PANEL WITH GFR
ALT: 18 U/L (ref 0–44)
AST: 21 U/L (ref 15–41)
Albumin: 3.5 g/dL (ref 3.5–5.0)
Alkaline Phosphatase: 68 U/L (ref 38–126)
Anion gap: 5 (ref 5–15)
BUN: 20 mg/dL (ref 8–23)
CO2: 27 mmol/L (ref 22–32)
Calcium: 9.3 mg/dL (ref 8.9–10.3)
Chloride: 108 mmol/L (ref 98–111)
Creatinine, Ser: 1.22 mg/dL (ref 0.61–1.24)
GFR, Estimated: >60 mL/min
Glucose, Bld: 124 mg/dL — ABNORMAL HIGH (ref 70–99)
Potassium: 4 mmol/L (ref 3.5–5.1)
Sodium: 140 mmol/L (ref 135–145)
Total Bilirubin: 1.2 mg/dL (ref 0.3–1.2)
Total Protein: 6.4 g/dL — ABNORMAL LOW (ref 6.5–8.1)

## 2022-07-26 LAB — TROPONIN I (HIGH SENSITIVITY)
Troponin I (High Sensitivity): 7 ng/L (ref <18)
Troponin I (High Sensitivity): 6 ng/L

## 2022-07-26 LAB — BRAIN NATRIURETIC PEPTIDE: B Natriuretic Peptide: 92.3 pg/mL (ref 0.0–100.0)

## 2022-07-26 LAB — SARS CORONAVIRUS 2 BY RT PCR: SARS Coronavirus 2 by RT PCR: NEGATIVE

## 2022-07-26 LAB — TSH: TSH: 1.344 u[IU]/mL (ref 0.350–4.500)

## 2022-07-26 MED ORDER — POLYETHYLENE GLYCOL 3350 17 G PO PACK: 17.0000 g | PACK | Freq: Every day | ORAL | Status: DC | PRN

## 2022-07-26 MED ORDER — ACETAMINOPHEN 325 MG PO TABS: 650.0000 mg | ORAL_TABLET | Freq: Four times a day (QID) | ORAL | Status: DC | PRN

## 2022-07-26 MED ORDER — ONDANSETRON HCL 4 MG/2ML IJ SOLN: 4.0000 mg | Freq: Four times a day (QID) | INTRAMUSCULAR | Status: DC | PRN

## 2022-07-26 MED ORDER — IOHEXOL 300 MG/ML  SOLN
100.0000 mL | Freq: Once | INTRAMUSCULAR | Status: AC | PRN
  Administered 2022-07-26: 100 mL via INTRAVENOUS

## 2022-07-26 MED ORDER — METOCLOPRAMIDE HCL 5 MG/ML IJ SOLN
10.0000 mg | Freq: Once | INTRAMUSCULAR | Status: AC
  Administered 2022-07-26: 10 mg via INTRAVENOUS
  Filled 2022-07-26: qty 2

## 2022-07-26 MED ORDER — LISINOPRIL 20 MG PO TABS
20.0000 mg | ORAL_TABLET | Freq: Every day | ORAL | Status: DC
  Administered 2022-07-27 – 2022-07-28 (×2): 20 mg via ORAL
  Filled 2022-07-26 (×2): qty 1

## 2022-07-26 MED ORDER — ONDANSETRON HCL 4 MG PO TABS
4.0000 mg | ORAL_TABLET | Freq: Four times a day (QID) | ORAL | Status: DC | PRN
  Administered 2022-07-27: 4 mg via ORAL
  Filled 2022-07-26: qty 1

## 2022-07-26 MED ORDER — AMLODIPINE BESYLATE 5 MG PO TABS
2.5000 mg | ORAL_TABLET | Freq: Every day | ORAL | Status: DC
  Administered 2022-07-27 – 2022-07-28 (×2): 2.5 mg via ORAL
  Filled 2022-07-26 (×2): qty 1

## 2022-07-26 MED ORDER — SODIUM CHLORIDE 0.9 % IV SOLN: Freq: Once | INTRAVENOUS | Status: AC

## 2022-07-26 MED ORDER — SODIUM CHLORIDE 0.9% FLUSH
3.0000 mL | Freq: Two times a day (BID) | INTRAVENOUS | Status: DC
  Administered 2022-07-26 – 2022-07-28 (×4): 3 mL via INTRAVENOUS

## 2022-07-26 MED ORDER — SODIUM CHLORIDE 0.9 % IV BOLUS
1000.0000 mL | Freq: Once | INTRAVENOUS | Status: AC
  Administered 2022-07-26: 1000 mL via INTRAVENOUS

## 2022-07-26 MED ORDER — FOLIC ACID 1 MG PO TABS
500.0000 ug | ORAL_TABLET | Freq: Every day | ORAL | Status: DC
  Administered 2022-07-27 – 2022-07-28 (×2): 0.5 mg via ORAL
  Filled 2022-07-26 (×2): qty 1

## 2022-07-26 MED ORDER — ROSUVASTATIN CALCIUM 10 MG PO TABS
10.0000 mg | ORAL_TABLET | Freq: Every day | ORAL | Status: DC
  Administered 2022-07-27 – 2022-07-28 (×2): 10 mg via ORAL
  Filled 2022-07-26 (×2): qty 1

## 2022-07-26 MED ORDER — ACETAMINOPHEN 650 MG RE SUPP: 650.0000 mg | Freq: Four times a day (QID) | RECTAL | Status: DC | PRN

## 2022-07-26 MED ORDER — SODIUM CHLORIDE 0.9 % IV BOLUS
500.0000 mL | Freq: Once | INTRAVENOUS | Status: AC
  Administered 2022-07-26: 500 mL via INTRAVENOUS

## 2022-07-26 MED ORDER — ONDANSETRON HCL 4 MG/2ML IJ SOLN
4.0000 mg | Freq: Once | INTRAMUSCULAR | Status: AC
  Administered 2022-07-26: 4 mg via INTRAVENOUS
  Filled 2022-07-26: qty 2

## 2022-07-26 MED ORDER — ENOXAPARIN SODIUM 40 MG/0.4ML IJ SOSY
40.0000 mg | PREFILLED_SYRINGE | INTRAMUSCULAR | Status: DC
  Administered 2022-07-26 – 2022-07-27 (×2): 40 mg via SUBCUTANEOUS
  Filled 2022-07-26 (×2): qty 0.4

## 2022-07-26 NOTE — Assessment & Plan Note (Signed)
Renal function currently at baseline.   -Daily BMP while admitted

## 2022-07-26 NOTE — ED Provider Notes (Signed)
[facility] Provider Note    Event Date/Time   First MD Initiated Contact with Patient 07/26/22 1246     (approximate)   History   Near Syncope   HPI  Jason Krause is a 77 y.o. male with a history of CKD hypertension presents to the ER for evaluation of near syncopal episode that occurred while he was outside working the yard.  Occurred around 10 AM.  States he started feeling weak like he was about to pass out.  He denies any chest pain no palpitation no lateralizing weakness fatigue denies any headaches.  Went inside and sat down with some improvement.  Then felt like he was going to be sick to his stomach got up to go the bathroom and fell like he is about to pass out again.  Did have episodes of nonbilious nonbloody vomiting.  No melena no hematochezia.  He is feeling improved currently.     Physical Exam   Triage Vital Signs: ED Triage Vitals  Enc Vitals Group     BP 07/26/22 1248 (!) 168/102     Pulse Rate 07/26/22 1248 (!) 56     Resp 07/26/22 1248 18     Temp 07/26/22 1248 98.2 F (36.8 C)     Temp src --      SpO2 07/26/22 1248 96 %     Weight 07/26/22 1243 188 lb 9.6 oz (85.5 kg)     Height --      Head Circumference --      Peak Flow --      Pain Score 07/26/22 1243 0     Pain Loc --      Pain Edu? --      Excl. in GC? --     Most recent vital signs: Vitals:   07/26/22 1248  BP: (!) 168/102  Pulse: (!) 56  Resp: 18  Temp: 98.2 F (36.8 C)  SpO2: 96%     Constitutional: Alert  Eyes: Conjunctivae are normal.  Head: Atraumatic. Nose: No congestion/rhinnorhea. Mouth/Throat: Mucous membranes are moist.   Neck: Painless ROM.  Cardiovascular:   Good peripheral circulation. Respiratory: Normal respiratory effort.  No retractions.  Gastrointestinal: Soft and nontender.  Musculoskeletal:  no deformity Neurologic:  MAE spontaneously. No gross focal neurologic deficits are appreciated.  Skin:  Skin is warm, dry and intact. No  rash noted. Psychiatric: Mood and affect are normal. Speech and behavior are normal.    ED Results / Procedures / Treatments   Labs (all labs ordered are listed, but only abnormal results are displayed) Labs Reviewed  CBC WITH DIFFERENTIAL/PLATELET - Abnormal; Notable for the following components:      Result Value   WBC 13.1 (*)    Platelets 144 (*)    Neutro Abs 11.0 (*)    All other components within normal limits  COMPREHENSIVE METABOLIC PANEL - Abnormal; Notable for the following components:   Glucose, Bld 124 (*)    Total Protein 6.4 (*)    All other components within normal limits  SARS CORONAVIRUS 2 BY RT PCR  TROPONIN I (HIGH SENSITIVITY)  TROPONIN I (HIGH SENSITIVITY)     EKG  ED ECG REPORT I, Willy Eddy, the attending physician, personally viewed and interpreted this ECG.   Date: 07/26/2022  EKG Time: 12:51  Rate: 55  Rhythm: sinus  Axis: normal  Intervals:rbbb  ST&T Change: no stemi, no depression    RADIOLOGY Please see ED Course for my review and  interpretation.  I personally reviewed all radiographic images ordered to evaluate for the above acute complaints and reviewed radiology reports and findings.  These findings were personally discussed with the patient.  Please see medical record for radiology report.    PROCEDURES:  Critical Care performed: No  Procedures   MEDICATIONS ORDERED IN ED: Medications  sodium chloride 0.9 % bolus 500 mL (has no administration in time range)  sodium chloride 0.9 % bolus 1,000 mL (0 mLs Intravenous Stopped 07/26/22 1438)  ondansetron (ZOFRAN) injection 4 mg (4 mg Intravenous Given 07/26/22 1441)  iohexol (OMNIPAQUE) 300 MG/ML solution 100 mL (100 mLs Intravenous Contrast Given 07/26/22 1451)     IMPRESSION / MDM / ASSESSMENT AND PLAN / ED COURSE  I reviewed the triage vital signs and the nursing notes.                              Differential diagnosis includes, but is not limited to, dehydration,  orthostasis, anemia, dysrhythmia, electrolyte abnormality, viral illness E Patient presenting to the ER for evaluation of symptoms as described above.  Based on symptoms, risk factors and considered above differential, this presenting complaint could reflect a potentially life-threatening illness therefore the patient will be placed on continuous pulse oximetry and telemetry for monitoring.  Laboratory evaluation will be sent to evaluate for the above complaints.      Clinical Course as of 07/26/22 1507  Mon Jul 26, 2022  1441 Patient now diaphoretic complaining of worsening nausea and vomiting.  Will order CT imaging to evaluate for the above differential have ordered Zofran will give additional IV fluids. [PR]  1507 Patient signed out to oncoming physician pending follow-up imaging and remainder of labs.  Disposition pending reassessment. [PR]    Clinical Course User Index [PR] Willy Eddy, MD     FINAL CLINICAL IMPRESSION(S) / ED DIAGNOSES   Final diagnoses:  Near syncope  Nausea and vomiting, unspecified vomiting type     Rx / DC Orders   ED Discharge Orders     None        Note:  This document was prepared using Dragon voice recognition software and may include unintentional dictation errors.    Willy Eddy, MD 07/26/22 [phone]

## 2022-07-26 NOTE — Assessment & Plan Note (Addendum)
Patient presenting with sudden onset dizziness with nausea and one-time episode of vomiting, concerning for presyncope.  Symptoms worsened with standing, however patient denies any recent poor p.o. intake or GI symptoms to explain orthostasis.  On arrival, he was bradycardic, however it appears this is chronic and heart rate has improved.  Mild lower extremity edema on exam with no other evidence of hypervolemia.  Per chart review, patient is currently being worked up for Parkinson's disease, which may place him at risk for dysautonomia.  - Telemetry monitoring - Orthostatic vital signs - Echocardiogram - BNP, TSH pending - Continue maintenance IV fluids

## 2022-07-26 NOTE — Assessment & Plan Note (Signed)
-   Holding home propranolol in the setting of bradycardia

## 2022-07-26 NOTE — Assessment & Plan Note (Signed)
No prior history of diabetes.  CBG within normal limits.  - A1c pending

## 2022-07-26 NOTE — Assessment & Plan Note (Signed)
Patient's blood pressure stable on admission.  Will check orthostatic vital signs and if negative, can restart home antihypertensives.  - Orthostatic vital signs pending

## 2022-07-26 NOTE — Telephone Encounter (Signed)
Called and spoke to wife, pt is at hospital now

## 2022-07-26 NOTE — Telephone Encounter (Signed)
Copied from CRM [id number]. Topic: General - Other >> Jul 26, 2022 12:51 PM Dominique A wrote: Reason for CRM: Bonita Quin pt wife is calling to speak with Delice Bison. The EMT just took her husband and the EMT advised her to call the office and speak with Delice Bison. They are taking the pt to the hospital in [place]. They are concerned pt heart rate is 47 and he is throwing up and light headed. Please have Delice Bison call Bonita Quin back.

## 2022-07-26 NOTE — H&P (Addendum)
History and Physical    Patient: Jason Krause ZOX:[id number] DOB: [birth year] DOA: 07/26/2022 DOS: the patient was seen and examined on 07/26/2022 PCP: Duanne Limerick, MD  Patient coming from: Home  Chief Complaint:  Chief Complaint  Patient presents with   Near Syncope   HPI: Jason Krause is a 77 y.o. male with medical history significant of hypertension, hyperlipidemia, CKD stage III, RCC s/p nephrectomy, SAH/CVA, OSA on CPAP, prostate cancer, who presents to the ED due to near syncope.  Jason Krause states that he was outside for approximately 30 minutes loading objects into his truck that were not heavy in weight,  when he had sudden onset dizziness.  Due to these symptoms, he went inside and sat down, however his dizziness persisted.  He got up and went to go check his blood pressure, stating that it was approximately 138/80 with heart rate of 47.  When he stood up to do this, he noted that his dizziness got worse.  Due to that, EMS was called.  After EMS arrival, he had a one-time episode of vomiting.  At this time, he endorses persistent dizziness, nausea and generalized weakness.  He denies any chest pain, palpitations, shortness of breath before or after symptom onset.  He denies any recent illness including fever, chills, abdominal pain, diarrhea, cough, runny nose.  He notes that he has a history of low heart rate.  He denies any lower extremity edema or orthopnea.  He uses his CPAP consistently.  ED course: On arrival to the ED, patient was hypertensive at 168/102 with heart rate of 55.  He was afebrile 98.2.  He was saturating at 96% on room air.  Initial workup notable for WBC of 13.1 hemoglobin of 15.4, platelets of 144, potassium 4.0, bicarb 27, glucose 124, creatinine 1.22, and GFR above 60.  Troponin negative x 2.  COVID-19 PCR negative.  Urinalysis was obtained that demonstrates glucosuria and ketonuria. Chest x-ray was obtained that did not show any focal consolidations.  CT head was  obtained that did not show any acute intracranial abnormality.  CT of the abdomen was obtained that demonstrated mild left hydronephrosis with multiple renal lesions, but no other significant findings.  Patient was given IV fluids, however given persistence of symptoms, [facility] contacted for admission.  Review of Systems: As mentioned in the history of present illness. All other systems reviewed and are negative.  Past Medical History:  Diagnosis Date   Cancer    kidney and prostate   Chronic kidney disease    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   Hyperlipidemia    Hypertension    Prostate cancer    Stroke    slight weakness in right foot   Past Surgical History:  Procedure Laterality Date   COLONOSCOPY WITH PROPOFOL N/A 12/06/2019   Procedure: COLONOSCOPY and biopsy WITH PROPOFOL;  Surgeon: Midge Minium, MD;  Location: [facility];  Service: Endoscopy;  Laterality: N/A;  priority 4   HERNIA REPAIR     kidney removal due to cancer Right    POLYPECTOMY N/A 12/06/2019   Procedure: POLYPECTOMY;  Surgeon: Midge Minium, MD;  Location: [facility];  Service: Endoscopy;  Laterality: N/A;   PROSTATE SURGERY     removed   Social History:  reports that he has never smoked. He has never been exposed to tobacco smoke. He has never used smokeless tobacco. He reports current alcohol use. He reports that he does not use drugs.  Allergies  Allergen Reactions   Carvedilol Other (See Comments)    "makes me a zombie" Other reaction(s): Abdominal Pain, Hallucination Lethargic    Simvastatin     Other reaction(s): Muscle Pain    Family History  Problem Relation Age of Onset   Cancer Father    Heart disease Father    Diabetes Sister    Stroke Maternal Grandmother    Heart disease Paternal Grandmother     Prior to Admission medications   Medication Sig Start Date End Date Taking? Authorizing Provider  amLODipine (NORVASC) 2.5 MG tablet Take 1 tablet (2.5 mg total) by mouth  daily. 06/04/22   Duanne Limerick, MD  folic acid (FOLVITE) 400 MCG tablet Take 1 tablet by mouth daily.    [provider]  Glucos-Chondroit-Hyaluron-MSM (GLUCOSAMINE CHONDROITIN JOINT PO) Take 2 tablets by mouth daily.    [provider]  lisinopril (ZESTRIL) 20 MG tablet Take 1 tablet (20 mg total) by mouth daily. 06/04/22   Duanne Limerick, MD  Multiple Vitamins-Minerals (CENTRUM SILVER PO) Take 1 tablet by mouth daily.    [provider]  propranolol ER (INDERAL LA) 60 MG 24 hr capsule TAKE 3 CAPSULES BY MOUTH DAILY. TAKE 2 CAPSULES BY MOUTH ONCE EVERY MORNING AND1 CAPSULE BY MOUTH ONCE EVERY EVENING 06/04/22   Duanne Limerick, MD  rosuvastatin (CRESTOR) 10 MG tablet Take 1 tablet (10 mg total) by mouth daily. 06/04/22   Duanne Limerick, MD    Physical Exam: Vitals:   07/26/22 1248 07/26/22 1530 07/26/22 1730 07/26/22 1755  BP: (!) 168/102 (!) 160/108 (!) 132/106   Pulse: (!) 56 65 79 78  Resp: (!) 21  Temp: 98.2 F (36.8 C)     SpO2: 96% 92% 92% 93%  Weight:       Physical Exam Vitals and nursing note reviewed.  Constitutional:      General: He is not in acute distress.    Appearance: He is normal weight. He is not toxic-appearing.  HENT:     Head: Normocephalic and atraumatic.     Mouth/Throat:     Mouth: Mucous membranes are moist.     Pharynx: Oropharynx is clear.  Eyes:     Conjunctiva/sclera: Conjunctivae normal.     Pupils: Pupils are equal, round, and reactive to light.  Cardiovascular:     Rate and Rhythm: Normal rate and regular rhythm.     Heart sounds: No murmur heard.    No gallop.     Comments: Trace bilateral pitting edema up to the knees Pulmonary:     Effort: Pulmonary effort is normal.     Breath sounds: Decreased breath sounds (Bibasilar) present. No wheezing, rhonchi or rales.  Musculoskeletal:        General: No tenderness or signs of injury.  Skin:    General: Skin is warm and dry.  Neurological:     General: No  focal deficit present.     Mental Status: He is alert and oriented to person, place, and time. Mental status is at baseline.  Psychiatric:        Mood and Affect: Mood normal.        Behavior: Behavior normal.    Data Reviewed: CBC with WBC of 13.1, hemoglobin of 15.4, platelets of 144 CMP with sodium of 140, potassium 4.0, bicarb 27, glucose 124, BUN 20, creatinine 1.22, anion gap 5, AST 21, ALT 18, GFR above 60 Troponin negative x 2 Urinalysis with glucosuria, ketonuria  but no other findings.  EKG reviewed.  Sinus rhythm with a rate of 55.  RBBB and LAFB.  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  CT ABDOMEN PELVIS W CONTRAST  Result Date: 07/26/2022 CLINICAL DATA:  Abdominal pain. EXAM: CT ABDOMEN AND PELVIS WITH CONTRAST TECHNIQUE: Multidetector CT imaging of the abdomen and pelvis was performed using the standard protocol following bolus administration of intravenous contrast. RADIATION DOSE REDUCTION: This exam was performed according to the departmental dose-optimization program which includes automated exposure control, adjustment of the mA and/or kV according to patient size and/or use of iterative reconstruction technique. CONTRAST:  OMNIPAQUE IOHEXOL 300 MG/ML  SOLN COMPARISON:  None Available. FINDINGS: Lower chest: Dependent atelectasis noted in both lower lobes. Hepatobiliary: No suspicious focal abnormality within the liver parenchyma. Tiny calcified gallstones evident. No intrahepatic or extrahepatic biliary dilation. Pancreas: No focal mass lesion. No dilatation of the main duct. No intraparenchymal cyst. No peripancreatic edema. Spleen: No splenomegaly. No focal mass lesion. Adrenals/Urinary Tract: No adrenal nodule or mass. Right kidney surgically absent. Multiple low-density left renal lesions evident measuring up to 6.1 cm. Generally these are well-defined and homogeneous without definite enhancing components and  probably represent a combination of Bosniak I and II benign cyst. Assessment is limited by lack of precontrast imaging. Mild left hydronephrosis noted, appearance accentuated by superimposed central sinus cyst. Mild fullness in the proximal and mid left ureter with decompressed distal left ureter. The urinary bladder appears normal for the degree of distention. Stomach/Bowel: Stomach is unremarkable. No gastric wall thickening. No evidence of outlet obstruction. Duodenum is normally positioned as is the ligament of Treitz. No small bowel wall thickening. No small bowel dilatation. The terminal ileum is normal. The appendix is normal. Diverticuli are seen scattered along the entire length of the colon without CT findings of diverticulitis. Vascular/Lymphatic: There is moderate atherosclerotic calcification of the abdominal aorta without aneurysm. There is no gastrohepatic or hepatoduodenal ligament lymphadenopathy. No retroperitoneal or mesenteric lymphadenopathy. No pelvic sidewall lymphadenopathy. Reproductive: Hysterectomy.  There is no adnexal mass. Other: No intraperitoneal free fluid. Musculoskeletal: Small left groin hernia contains only fat. Status post right groin herniorrhaphy with mesh. No worrisome lytic or sclerotic osseous abnormality. Degenerative changes noted right hip. IMPRESSION: 1. Mild left hydronephrosis, appearance accentuated by superimposed central sinus cysts. Mild fullness in the proximal and mid left ureter with decompressed distal left ureter. No obstructing stone or mass lesion evident. Correlation for hematuria recommended and hematuria protocol CT may prove helpful to further evaluate as clinically warranted. 2. Status post right nephrectomy. 3. Multiple low-density left renal lesions measuring up to 6.1 cm. Generally, these are well-defined and homogeneous without definite enhancing components and probably represent a combination of Bosniak I and II benign cyst. Assessment is limited  by lack of precontrast imaging. MRI abdomen or CT without and with contrast could be used to further evaluate as clinically warranted. 4. Cholelithiasis. 5. Colonic diverticulosis without diverticulitis. 6. Small left groin hernia contains only fat. 7.  Aortic Atherosclerosis (ICD10-I70.0). Electronically Signed   By: Kennith Center M.D.   On: 07/26/2022 15:32   CT HEAD WO CONTRAST ( )  Result Date: 07/26/2022 CLINICAL DATA:  Near syncope.  Mental status changes. EXAM: CT HEAD WITHOUT CONTRAST TECHNIQUE: Contiguous axial images were obtained from the base of the skull through the vertex without intravenous contrast. RADIATION DOSE REDUCTION: This exam was performed according to the departmental dose-optimization program which includes automated  exposure control, adjustment of the mA and/or kV according to patient size and/or use of iterative reconstruction technique. COMPARISON:  None Available. FINDINGS: Brain: There is no evidence for acute hemorrhage, hydrocephalus, mass lesion, or abnormal extra-axial fluid collection. No definite CT evidence for acute infarction. Patchy low attenuation in the deep hemispheric and periventricular white matter is nonspecific, but likely reflects chronic microvascular ischemic demyelination. Vascular: No hyperdense vessel or unexpected calcification. Skull: No evidence for fracture. No worrisome lytic or sclerotic lesion. Sinuses/Orbits: The visualized paranasal sinuses and mastoid air cells are clear. Visualized portions of the globes and intraorbital fat are unremarkable. Other: None. IMPRESSION: 1. No acute intracranial abnormality. 2. Chronic small vessel ischemic disease. Electronically Signed   By: Kennith CenterEric  Mansell M.D.   On: 07/26/2022 15:26   DG Chest Portable 1 View  Result Date: 07/26/2022 CLINICAL DATA:  Near-syncope EXAM: PORTABLE CHEST 1 VIEW COMPARISON:  None Available. FINDINGS: Cardiac silhouette appears prominent. No pneumonia or pulmonary edema. No  pneumothorax or pleural effusion. Aorta is calcified. IMPRESSION: Enlarged cardiac silhouette.  No focal consolidation. Electronically Signed   By: Layla MawJoshua  Pleasure M.D.   On: 07/26/2022 13:16    There are no new results to review at this time.  Assessment and Plan:  * Pre-syncope Patient presenting with sudden onset dizziness with nausea and one-time episode of vomiting, concerning for presyncope.  Symptoms worsened with standing, however patient denies any recent poor p.o. intake or GI symptoms to explain orthostasis.  On arrival, he was bradycardic, however it appears this is chronic and heart rate has improved.  Mild lower extremity edema on exam with no other evidence of hypervolemia.  Per chart review, patient is currently being worked up for Parkinson's disease, which may place him at risk for dysautonomia.  - Telemetry monitoring - Orthostatic vital signs - Echocardiogram - BNP, TSH pending - Continue maintenance IV fluids  Essential hypertension Patient's blood pressure stable on admission.  Will check orthostatic vital signs and if negative, can restart home antihypertensives.  - Orthostatic vital signs pending  Essential tremor - Holding home propranolol in the setting of bradycardia  CKD (chronic kidney disease) stage 3, GFR 30-59 ml/min Renal function currently at baseline.   -Daily BMP while admitted  Multiple renal cysts CT imaging notable for multiple renal cysts.  Patient follows with urology who has been made aware of imaging by EDP.  Recommended continued outpatient follow-up.  Glucosuria No prior history of diabetes.  CBG within normal limits.  - A1c pending  Advance Care Planning:   Code Status: Full Code verified by patient  Consults: None  Family Communication: Patient's wife updated at bedside  Severity of Illness: The appropriate patient status for this patient is OBSERVATION. Observation status is judged to be reasonable and necessary in order to  provide the required intensity of service to ensure the patient's safety. The patient's presenting symptoms, physical exam findings, and initial radiographic and laboratory data in the context of their medical condition is felt to place them at decreased risk for further clinical deterioration. Furthermore, it is anticipated that the patient will be medically stable for discharge from the hospital within 2 midnights of admission.   Author: Verdene LennertIulia Abimael Zeiter, MD 07/26/2022 6:42 PM  For on call review [email or web address].

## 2022-07-26 NOTE — ED Triage Notes (Signed)
Pt from home via [facility] with reports of near syncope while outside. Per EMS pt was outside and became diaphoretic and dizzy and felt like he was going to faint. Pt reports emesis. Given 4mg  zofran and 500NaCl en route.

## 2022-07-26 NOTE — ED Provider Notes (Signed)
-----------------------------------------   6:13 PM on 07/26/2022 -----------------------------------------   I took over care of this patient from Dr. Roxan Hockey.  CT showed some hydronephrosis and hydroureter in the patient's remaining kidney although no evidence of ureteral stone.  Urinalysis does not show any hematuria.  Creatinine is normal.  I consulted Dr. Richardo Hanks from urology who reviewed the images.  He advises that there is no recommendation for further testing or acute intervention and he will arrange to follow-up with the patient in the office.  On reassessment, the patient continued to have nausea, had an additional episode of vomiting, and continue to feel very dizzy and wobbly on his feet when trying to get up.  Overall presentation is most consistent with viral gastroenteritis and dehydration.  I have ordered additional fluids.  The patient is not stable enough to go home at this time.  I consulted Dr. Huel Cote from the hospitalist service; based on her discussion she agreed to evaluate the patient for admission.   Dionne Bucy, MD 07/26/22 2048

## 2022-07-26 NOTE — Assessment & Plan Note (Signed)
CT imaging notable for multiple renal cysts.  Patient follows with urology who has been made aware of imaging by EDP.  Recommended continued outpatient follow-up.

## 2022-07-26 NOTE — ED Notes (Signed)
Pt ambulatory to bathroom with 1 person assist.

## 2022-07-27 ENCOUNTER — Observation Stay
Admit: 2022-07-27 | Discharge: 2022-07-27 | Disposition: A | Discharge status: Home / Self Care | Payer: Medicare PPO | Attending: Internal Medicine | Admitting: Internal Medicine

## 2022-07-27 DIAGNOSIS — M7022 Olecranon bursitis, left elbow: Secondary | ICD-10-CM | POA: Diagnosis not present

## 2022-07-27 DIAGNOSIS — I4510 Unspecified right bundle-branch block: Secondary | ICD-10-CM | POA: Diagnosis present

## 2022-07-27 DIAGNOSIS — G250 Essential tremor: Secondary | ICD-10-CM | POA: Diagnosis present

## 2022-07-27 DIAGNOSIS — Z1152 Encounter for screening for COVID-19: Secondary | ICD-10-CM | POA: Diagnosis not present

## 2022-07-27 DIAGNOSIS — Z79899 Other long term (current) drug therapy: Secondary | ICD-10-CM | POA: Diagnosis not present

## 2022-07-27 DIAGNOSIS — Z905 Acquired absence of kidney: Secondary | ICD-10-CM | POA: Diagnosis not present

## 2022-07-27 DIAGNOSIS — I129 Hypertensive chronic kidney disease with stage 1 through stage 4 chronic kidney disease, or unspecified chronic kidney disease: Secondary | ICD-10-CM | POA: Diagnosis present

## 2022-07-27 DIAGNOSIS — R269 Unspecified abnormalities of gait and mobility: Secondary | ICD-10-CM | POA: Diagnosis not present

## 2022-07-27 DIAGNOSIS — N281 Cyst of kidney, acquired: Secondary | ICD-10-CM | POA: Diagnosis present

## 2022-07-27 DIAGNOSIS — Z823 Family history of stroke: Secondary | ICD-10-CM | POA: Diagnosis not present

## 2022-07-27 DIAGNOSIS — Z8249 Family history of ischemic heart disease and other diseases of the circulatory system: Secondary | ICD-10-CM | POA: Diagnosis not present

## 2022-07-27 DIAGNOSIS — N1831 Chronic kidney disease, stage 3a: Secondary | ICD-10-CM | POA: Diagnosis present

## 2022-07-27 DIAGNOSIS — E785 Hyperlipidemia, unspecified: Secondary | ICD-10-CM | POA: Diagnosis present

## 2022-07-27 DIAGNOSIS — R55 Syncope and collapse: Secondary | ICD-10-CM | POA: Diagnosis not present

## 2022-07-27 DIAGNOSIS — A084 Viral intestinal infection, unspecified: Secondary | ICD-10-CM | POA: Diagnosis present

## 2022-07-27 DIAGNOSIS — Z833 Family history of diabetes mellitus: Secondary | ICD-10-CM | POA: Diagnosis not present

## 2022-07-27 DIAGNOSIS — Z8546 Personal history of malignant neoplasm of prostate: Secondary | ICD-10-CM | POA: Diagnosis not present

## 2022-07-27 DIAGNOSIS — N1330 Unspecified hydronephrosis: Secondary | ICD-10-CM | POA: Diagnosis present

## 2022-07-27 DIAGNOSIS — Z8673 Personal history of transient ischemic attack (TIA), and cerebral infarction without residual deficits: Secondary | ICD-10-CM | POA: Diagnosis not present

## 2022-07-27 DIAGNOSIS — Z85528 Personal history of other malignant neoplasm of kidney: Secondary | ICD-10-CM | POA: Diagnosis not present

## 2022-07-27 DIAGNOSIS — E860 Dehydration: Secondary | ICD-10-CM | POA: Diagnosis present

## 2022-07-27 LAB — GASTROINTESTINAL PANEL BY PCR, STOOL (REPLACES STOOL CULTURE)

## 2022-07-27 LAB — COMPREHENSIVE METABOLIC PANEL
ALT: 17 U/L (ref 0–44)
AST: 24 U/L (ref 15–41)
Albumin: 3.2 g/dL — ABNORMAL LOW (ref 3.5–5.0)
Alkaline Phosphatase: 75 U/L (ref 38–126)
Anion gap: 7 (ref 5–15)
BUN: 20 mg/dL (ref 8–23)
CO2: 25 mmol/L (ref 22–32)
Calcium: 9.2 mg/dL (ref 8.9–10.3)
Chloride: 108 mmol/L (ref 98–111)
Creatinine, Ser: 1.29 mg/dL — ABNORMAL HIGH (ref 0.61–1.24)
GFR, Estimated: 58 mL/min — ABNORMAL LOW (ref >60)
Glucose, Bld: 120 mg/dL — ABNORMAL HIGH (ref 70–99)
Potassium: 4.1 mmol/L (ref 3.5–5.1)
Sodium: 140 mmol/L (ref 135–145)
Total Bilirubin: 1.2 mg/dL (ref 0.3–1.2)
Total Protein: 5.9 g/dL — ABNORMAL LOW (ref 6.5–8.1)

## 2022-07-27 LAB — ECHOCARDIOGRAM COMPLETE
AR max vel: 1.95 cm2
AV Area VTI: 2.29 cm2
AV Area mean vel: 1.72 cm2
AV Mean grad: 4 mmHg
AV Peak grad: 7.6 mmHg
Ao pk vel: 1.38 m/s
Area-P 1/2: 2.77 cm2
MV VTI: 1.96 cm2
S' Lateral: 2.9 cm
Weight: 3017.6 oz

## 2022-07-27 LAB — PHOSPHORUS: Phosphorus: 3.8 mg/dL (ref 2.5–4.6)

## 2022-07-27 LAB — CBC
HCT: 43.6 % (ref 39.0–52.0)
Hemoglobin: 14.4 g/dL (ref 13.0–17.0)
MCH: 32.4 pg (ref 26.0–34.0)
MCHC: 33 g/dL (ref 30.0–36.0)
MCV: 98.2 fL (ref 80.0–100.0)
Platelets: 135 10*3/uL — ABNORMAL LOW (ref 150–400)
RBC: 4.44 MIL/uL (ref 4.22–5.81)
RDW: 12.9 % (ref 11.5–15.5)
WBC: 11.8 10*3/uL — ABNORMAL HIGH (ref 4.0–10.5)
nRBC: 0 % (ref 0.0–0.2)

## 2022-07-27 LAB — MAGNESIUM: Magnesium: 2 mg/dL (ref 1.7–2.4)

## 2022-07-27 LAB — C DIFFICILE QUICK SCREEN W PCR REFLEX
C Diff antigen: NEGATIVE
C Diff interpretation: NOT DETECTED
C Diff toxin: NEGATIVE

## 2022-07-27 LAB — PROCALCITONIN: Procalcitonin: <0.1 ng/mL

## 2022-07-27 MED ORDER — SODIUM CHLORIDE 0.9 % IV SOLN
1.0000 g | INTRAVENOUS | Status: DC
  Administered 2022-07-27 – 2022-07-28 (×2): 1 g via INTRAVENOUS
  Filled 2022-07-27 (×2): qty 1

## 2022-07-27 MED ORDER — SODIUM CHLORIDE 0.9 % IV SOLN: INTRAVENOUS | Status: DC

## 2022-07-27 NOTE — Progress Notes (Signed)
[facility] Progress Note  Patient: Jason MillmanLarry Krause    ZOX:[id number]  DOA: 07/26/2022     Date of Service: the patient was seen and examined on 07/27/2022  Chief Complaint  Patient presents with   Near Syncope   Brief hospital course: Jason MillmanLarry Mondesir is a 77 y.o. male with medical history significant of hypertension, hyperlipidemia, CKD stage III, RCC s/p nephrectomy, SAH/CVA, OSA on CPAP, prostate cancer, who presents to the ED due to near syncope.  Patient was unloading object in his truck which were not heavy and felt dizziness, BP 138/80, heart rate 47.  Patient was feeling nauseous and vomiting and had 1 episode of diarrhea at home and 2 episodes after coming to the ED.  Denies any abdominal pain, no chest pain or palpitations.  Denied fever. ED workup: Afebrile BP 168/102, HR 55, WBC 13.1 elevated, BMP within normal range COVID negative, UA negative, Chest x-ray was obtained that did not show any focal consolidations.  CT head was obtained that did not show any acute intracranial abnormality.  CT of the abdomen was obtained that demonstrated mild left hydronephrosis with multiple renal lesions, but no other significant findings. Patient was given IV fluids, however given persistence of symptoms, [facility] contacted for admission.    Assessment and Plan:  # Presyncope, unknown cause could be due to gastroenteritis Patient presented with nausea vomiting and loose stools Dizziness is improving Continue IV fluid for gentle hydration Continue to monitor on telemetry TSH 1.3 within normal range, BNP 92 WNL, troponin negative x 2 Follow TTE Check orthostatic vital signs Follow stool for C. difficile and GI pathogen   # Left hydronephrosis and left renal cyst CT scan: Mild left hydronephrosis, appearance accentuated by superimposed central sinus cysts. Mild fullness in the proximal and mid left ureter with decompressed distal left ureter. No obstructing stone or mass lesion evident. Correlation  for hematuria recommended and hematuria protocol CT may prove helpful to further evaluate as clinically warranted. 2. Status post right nephrectomy. 3. Multiple low-density left renal lesions measuring up to 6.1 cm. Generally, these are well-defined and homogeneous without definite enhancing components and probably represent a combination of Bosniak I and II benign cyst. Assessment is limited by lack of precontrast imaging. MRI abdomen or CT without and with contrast could be used to further evaluate as clinically warranted.  ED physician discussed with urologist, who recommended no intervention and follow-up as an outpatient Due to leukocytosis, empirically started ceftriaxone 1 g IV daily Trend WBC count Infection is less likely suspected, procalcitonin is negative   # Hypertension, HLD Continue amlodipine and lisinopril.  Continue Crestor Monitor BP and titrate medication accordingly  # Essential tremors Normal due to bradycardia Patient is being evaluated for Parkinson disease, may be experiencing dysautonomia Follow-up with neurology as an outpatient  Body mass index is 27.85 kg/m.  Interventions:       Diet: Regular diet DVT Prophylaxis: Subcutaneous Lovenox   Advance goals of care discussion: Full code  Family Communication: family was present at bedside, at the time of interview.  The pt provided permission to discuss medical plan with the family. Opportunity was given to ask question and all questions were answered satisfactorily.   Disposition:  Pt is from Home, admitted with presyncope, gastroenteritis, still has nausea vomiting and diarrhea, which precludes a safe discharge. Discharge to home, when STABLE, may discharge tomorrow a.m.  Subjective: No significant events overnight, patient still having nausea vomiting and diarrhea, dizziness is improving.  No any chest  pain or palpitation, no shortness of breath.  No fever  Physical Exam: General: NAD, lying  comfortably Appear in no distress, affect appropriate Eyes: PERRLA ENT: Oral Mucosa Clear, moist  Neck: no JVD,  Cardiovascular: S1 and S2 Present, no Murmur,  Respiratory: good respiratory effort, Bilateral Air entry equal and Decreased, no Crackles, no wheezes Abdomen: Bowel Sound present, Soft and no tenderness,  Skin: no rashes Extremities: no Pedal edema, no calf tenderness Neurologic: without any new focal findings Gait not checked due to patient safety concerns  Vitals:   07/27/22 0011 07/27/22 0446 07/27/22 0835 07/27/22 1139  BP: 125/87 105/62 (!) 137/90 139/89  Pulse: 87 60 75 74  Resp: 18 14 16 16   Temp: 97.8 F (36.6 C) 98.6 F (37 C) 98.2 F (36.8 C) 98.2 F (36.8 C)  TempSrc:    Oral  SpO2: 93% 92% 93% 94%  Weight:        Intake/Output Summary (Last 24 hours) at 07/27/2022 1239 Last data filed at 07/27/2022 0020 Gross per 24 hour  Intake 1000 ml  Output 400 ml  Net 600 ml   Filed Weights   07/26/22 1243  Weight: 85.5 kg    Data Reviewed: I have personally reviewed and interpreted daily labs, tele strips, imagings as discussed above. I reviewed all nursing notes, pharmacy notes, vitals, pertinent old records I have discussed plan of care as described above with RN and patient/family.  CBC: Recent Labs  Lab 07/26/22 1248 07/27/22 0408  WBC 13.1* 11.8*  NEUTROABS 11.0*  --   HGB 15.4 14.4  HCT 46.3 43.6  MCV 98.5 98.2  PLT 144* 135*   Basic Metabolic Panel: Recent Labs  Lab 07/26/22 1248 07/27/22 0408  NA 140 140  K 4.0 4.1  CL 108 108  CO2 27 25  GLUCOSE 124* 120*  BUN 20 20  CREATININE 1.22 1.29*  CALCIUM 9.3 9.2  MG  --  2.0  PHOS  --  3.8    Studies: CT ABDOMEN PELVIS W CONTRAST  Result Date: 07/26/2022 CLINICAL DATA:  Abdominal pain. EXAM: CT ABDOMEN AND PELVIS WITH CONTRAST TECHNIQUE: Multidetector CT imaging of the abdomen and pelvis was performed using the standard protocol following bolus administration of intravenous  contrast. RADIATION DOSE REDUCTION: This exam was performed according to the departmental dose-optimization program which includes automated exposure control, adjustment of the mA and/or kV according to patient size and/or use of iterative reconstruction technique. CONTRAST:  OMNIPAQUE IOHEXOL 300 MG/ML  SOLN COMPARISON:  None Available. FINDINGS: Lower chest: Dependent atelectasis noted in both lower lobes. Hepatobiliary: No suspicious focal abnormality within the liver parenchyma. Tiny calcified gallstones evident. No intrahepatic or extrahepatic biliary dilation. Pancreas: No focal mass lesion. No dilatation of the main duct. No intraparenchymal cyst. No peripancreatic edema. Spleen: No splenomegaly. No focal mass lesion. Adrenals/Urinary Tract: No adrenal nodule or mass. Right kidney surgically absent. Multiple low-density left renal lesions evident measuring up to 6.1 cm. Generally these are well-defined and homogeneous without definite enhancing components and probably represent a combination of Bosniak I and II benign cyst. Assessment is limited by lack of precontrast imaging. Mild left hydronephrosis noted, appearance accentuated by superimposed central sinus cyst. Mild fullness in the proximal and mid left ureter with decompressed distal left ureter. The urinary bladder appears normal for the degree of distention. Stomach/Bowel: Stomach is unremarkable. No gastric wall thickening. No evidence of outlet obstruction. Duodenum is normally positioned as is the ligament of Treitz. No small bowel wall  thickening. No small bowel dilatation. The terminal ileum is normal. The appendix is normal. Diverticuli are seen scattered along the entire length of the colon without CT findings of diverticulitis. Vascular/Lymphatic: There is moderate atherosclerotic calcification of the abdominal aorta without aneurysm. There is no gastrohepatic or hepatoduodenal ligament lymphadenopathy. No retroperitoneal or mesenteric  lymphadenopathy. No pelvic sidewall lymphadenopathy. Reproductive: Hysterectomy.  There is no adnexal mass. Other: No intraperitoneal free fluid. Musculoskeletal: Small left groin hernia contains only fat. Status post right groin herniorrhaphy with mesh. No worrisome lytic or sclerotic osseous abnormality. Degenerative changes noted right hip. IMPRESSION: 1. Mild left hydronephrosis, appearance accentuated by superimposed central sinus cysts. Mild fullness in the proximal and mid left ureter with decompressed distal left ureter. No obstructing stone or mass lesion evident. Correlation for hematuria recommended and hematuria protocol CT may prove helpful to further evaluate as clinically warranted. 2. Status post right nephrectomy. 3. Multiple low-density left renal lesions measuring up to 6.1 cm. Generally, these are well-defined and homogeneous without definite enhancing components and probably represent a combination of Bosniak I and II benign cyst. Assessment is limited by lack of precontrast imaging. MRI abdomen or CT without and with contrast could be used to further evaluate as clinically warranted. 4. Cholelithiasis. 5. Colonic diverticulosis without diverticulitis. 6. Small left groin hernia contains only fat. 7.  Aortic Atherosclerosis (ICD10-I70.0). Electronically Signed   By: Kennith Center M.D.   On: 07/26/2022 15:32   CT HEAD WO CONTRAST ( )  Result Date: 07/26/2022 CLINICAL DATA:  Near syncope.  Mental status changes. EXAM: CT HEAD WITHOUT CONTRAST TECHNIQUE: Contiguous axial images were obtained from the base of the skull through the vertex without intravenous contrast. RADIATION DOSE REDUCTION: This exam was performed according to the departmental dose-optimization program which includes automated exposure control, adjustment of the mA and/or kV according to patient size and/or use of iterative reconstruction technique. COMPARISON:  None Available. FINDINGS: Brain: There is no evidence for acute  hemorrhage, hydrocephalus, mass lesion, or abnormal extra-axial fluid collection. No definite CT evidence for acute infarction. Patchy low attenuation in the deep hemispheric and periventricular white matter is nonspecific, but likely reflects chronic microvascular ischemic demyelination. Vascular: No hyperdense vessel or unexpected calcification. Skull: No evidence for fracture. No worrisome lytic or sclerotic lesion. Sinuses/Orbits: The visualized paranasal sinuses and mastoid air cells are clear. Visualized portions of the globes and intraorbital fat are unremarkable. Other: None. IMPRESSION: 1. No acute intracranial abnormality. 2. Chronic small vessel ischemic disease. Electronically Signed   By: Kennith Center M.D.   On: 07/26/2022 15:26   DG Chest Portable 1 View  Result Date: 07/26/2022 CLINICAL DATA:  Near-syncope EXAM: PORTABLE CHEST 1 VIEW COMPARISON:  None Available. FINDINGS: Cardiac silhouette appears prominent. No pneumonia or pulmonary edema. No pneumothorax or pleural effusion. Aorta is calcified. IMPRESSION: Enlarged cardiac silhouette.  No focal consolidation. Electronically Signed   By: Layla Maw M.D.   On: 07/26/2022 13:16    Scheduled Meds:  amLODipine  2.5 mg Oral Daily   enoxaparin (LOVENOX) injection  40 mg Subcutaneous Q24H   folic acid  500 mcg Oral Daily   lisinopril  20 mg Oral Daily   rosuvastatin  10 mg Oral Daily   sodium chloride flush  3 mL Intravenous Q12H   Continuous Infusions:  sodium chloride 75 mL/hr at 07/27/22 0849   cefTRIAXone (ROCEPHIN)  IV 1 g (07/27/22 1114)   PRN Meds: acetaminophen **OR** acetaminophen, ondansetron **OR** ondansetron (ZOFRAN) IV, polyethylene glycol  Time spent:  35 minutes  Author: Gillis Santa. MD [facility] 07/27/2022 12:39 PM  To reach On-call, see care teams to locate the attending and reach out to them via [email or web address]. If 7PM-7AM, please contact night-coverage If you still have difficulty reaching the  attending provider, please page the [facility] (Director on Call) for [facility] on amion for assistance.

## 2022-07-27 NOTE — Progress Notes (Signed)
Plan to move pt to private room when available

## 2022-07-27 NOTE — Progress Notes (Signed)
*  PRELIMINARY RESULTS* Echocardiogram 2D Echocardiogram has been performed.  Cristela Blue 07/27/2022, 11:19 AM

## 2022-07-28 DIAGNOSIS — R55 Syncope and collapse: Secondary | ICD-10-CM | POA: Diagnosis not present

## 2022-07-28 LAB — CBC
HCT: 46 % (ref 39.0–52.0)
Hemoglobin: 15.3 g/dL (ref 13.0–17.0)
MCH: 32.8 pg (ref 26.0–34.0)
MCHC: 33.3 g/dL (ref 30.0–36.0)
MCV: 98.5 fL (ref 80.0–100.0)
Platelets: 155 10*3/uL (ref 150–400)
RBC: 4.67 MIL/uL (ref 4.22–5.81)
RDW: 12.9 % (ref 11.5–15.5)
WBC: 12.3 10*3/uL — ABNORMAL HIGH (ref 4.0–10.5)
nRBC: 0 % (ref 0.0–0.2)

## 2022-07-28 LAB — BASIC METABOLIC PANEL
Anion gap: 6 (ref 5–15)
BUN: 16 mg/dL (ref 8–23)
CO2: 26 mmol/L (ref 22–32)
Calcium: 9.4 mg/dL (ref 8.9–10.3)
Chloride: 110 mmol/L (ref 98–111)
Creatinine, Ser: 1.27 mg/dL — ABNORMAL HIGH (ref 0.61–1.24)
GFR, Estimated: 59 mL/min — ABNORMAL LOW (ref >60)
Glucose, Bld: 110 mg/dL — ABNORMAL HIGH (ref 70–99)
Potassium: 3.5 mmol/L (ref 3.5–5.1)
Sodium: 142 mmol/L (ref 135–145)

## 2022-07-28 LAB — HEMOGLOBIN A1C
Hgb A1c MFr Bld: 5.8 % — ABNORMAL HIGH (ref 4.8–5.6)
Mean Plasma Glucose: 120 mg/dL

## 2022-07-28 LAB — MAGNESIUM: Magnesium: 2 mg/dL (ref 1.7–2.4)

## 2022-07-28 LAB — PHOSPHORUS: Phosphorus: 2.6 mg/dL (ref 2.5–4.6)

## 2022-07-28 MED ORDER — AMOXICILLIN-POT CLAVULANATE 875-125 MG PO TABS: 1.0000 | ORAL_TABLET | Freq: Two times a day (BID) | ORAL | 0 refills | Status: AC

## 2022-07-28 NOTE — Discharge Instructions (Signed)
[facility]- Helper bees through [facility]

## 2022-07-28 NOTE — TOC Transition Note (Signed)
Transition of Care [facility]) - CM/SW Discharge Note   Patient Details  Name: Jason Krause MRN: [id number] Date of Birth: Mar 17, 1946  [facility] CM/SW Contact:  Allena Katz, LCSW Phone Number: 07/28/2022, 11:37 AM   Clinical Narrative:   Pt discharging. Declines need for HH. Pt does want the number to [facility] for aide services through his [facility]. Pt also does want a RW. RW ordered through [employer].           Patient Goals and CMS Choice      Discharge Placement                         Discharge Plan and Services Additional resources added to the After Visit Summary for                                       Social Determinants of Health (SDOH) Interventions SDOH Screenings   Food Insecurity: No Food Insecurity (07/26/2022)  Housing: Low Risk  (07/26/2022)  Transportation Needs: No Transportation Needs (07/26/2022)  Utilities: Not At Risk (07/26/2022)  Alcohol Screen: Low Risk  (03/31/2022)  Depression (PHQ2-9): Low Risk  (07/07/2022)  Financial Resource Strain: Low Risk  (03/30/2021)  Physical Activity: Sufficiently Active (03/31/2022)  Social Connections: Moderately Integrated (03/31/2022)  Stress: No Stress Concern Present (03/31/2022)  Tobacco Use: Low Risk  (07/26/2022)     Readmission Risk Interventions     No data to display

## 2022-07-28 NOTE — Discharge Summary (Signed)
[facility] Discharge Summary   Patient: Jason MillmanLarry Zinn ZOX:[id number]  PCP: Duanne LimerickJones, Deanna C, MD  Date of admission: 07/26/2022   Date of discharge:  07/28/2022     Discharge Diagnoses:  Principal Problem:   Pre-syncope Active Problems:   Essential hypertension   Essential tremor   CKD (chronic kidney disease) stage 3, GFR 30-59 ml/min   Multiple renal cysts   Glucosuria   Admitted From: Home Disposition:  Home   Recommendations for Outpatient Follow-up:  Follow-up with PCP in 1 week, discontinue or decrease dose of propranolol due to bradycardia and monitor BP and heart rate at home. Skip the dose of propranolol if low blood pressure and low heart rate (SBP<120 and or Hr <65) Follow with cardiology for bradycardia and use of propranolol for tremors. Follow-up with neurology for further management of tremors Follow-up with urology as an outpatient for hydronephrosis and left renal cyst Follow up LABS/TEST:     Diet recommendation: Regular diet  Activity: The patient is advised to gradually reintroduce usual activities, as tolerated  Discharge Condition: stable  Code Status: Full code   History of present illness: As per the H and P dictated on admission Hospital Course:  Jason MillmanLarry Castile is a 77 y.o. male with medical history significant of hypertension, hyperlipidemia, CKD stage III, RCC s/p nephrectomy, SAH/CVA, OSA on CPAP, prostate cancer, who presents to the ED due to near syncope.  Patient was unloading object in his truck which were not heavy and felt dizziness, BP 138/80, heart rate 47.  Patient was feeling nauseous and vomiting and had 1 episode of diarrhea at home and 2 episodes after coming to the ED.  Denies any abdominal pain, no chest pain or palpitations.  Denied fever. ED workup: Afebrile BP 168/102, HR 55, WBC 13.1 elevated, BMP within normal range COVID negative, UA negative, Chest x-ray was obtained that did not show any focal consolidations.  CT head was  obtained that did not show any acute intracranial abnormality.  CT of the abdomen was obtained that demonstrated mild left hydronephrosis with multiple renal lesions, but no other significant findings. Patient was given IV fluids, however given persistence of symptoms, [facility] contacted for admission.      Assessment and Plan: # Presyncope, unknown cause could be due to dehydration, hypertension secondary to gastroenteritis vs bradycardia. Patient presented with nausea vomiting and loose stools S/p IV fluid given for gentle hydration, dizziness resolved.  Patient was monitored on telemetry, no significant events other than sinus bradycardia. TSH 1.3 within normal range, BNP 92 WNL, troponin negative x 2. 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  TTE shows LVEF 55 to 60%, grade 1 diastolic dysfunction.  No any other significant findings.  C. difficile and GI pathogen negative.  The patient denies any nausea vomiting, no abdominal pain.  Still having soft stools, no diarrhea.  Patient is asymptomatic and medically stable to discharge.  Patient agreed with the discharge planning.  Symptoms could be due to hypotension and bradycardia, patient is on propranolol for tremors.  Patient was advised to avoid taking beta-blocker if symptoms persist.  Follow with cardiology and neurologist as an outpatient for further management.  # Left hydronephrosis and left renal cyst CT scan: Mild left hydronephrosis, appearance accentuated by superimposed central sinus cysts. Mild fullness in the proximal and mid left ureter with decompressed distal left ureter. No obstructing stone or mass lesion evident. Correlation for hematuria recommended and hematuria protocol CT may prove helpful  to further evaluate as clinically warranted. 2. Status post right nephrectomy. 3. Multiple low-density left renal lesions measuring up to 6.1 cm. Generally, these are well-defined and homogeneous without definite enhancing  components and probably represent a combination of Bosniak I and II benign cyst. Assessment is limited by lack of precontrast imaging. MRI abdomen or CT without and with contrast could be used to further evaluate as clinically warranted.  ED physician discussed with urologist, who recommended no intervention and follow-up as an outpatient. Due to leukocytosis, empirically started ceftriaxone 1 g IV daily Trend WBC count trending down.  Procalcitonin negative.  Patient was discharged on empiric antibiotics Augmentin twice daily for 5 days. F/u with urologist.  # Hypertension, HLD, Continue amlodipine, lisinopril and Crestor # Essential tremors, Held propranolol due to bradycardia during hospital stay. Patient is being evaluated for Parkinson disease, may be experiencing dysautonomia. Follow-up with neurology as an outpatient.  Advised to hold off propranolol if patient have persistent symptoms of bradycardia and presyncope.  Body mass index is 28.8 kg/m.  Nutrition Interventions: Patient was ambulatory with a walker, which was ordered by case manager.. On the day of the discharge the patient's vitals were stable, and no other acute medical condition were reported by patient. the patient was felt safe to be discharge at Home.  Consultants: None Procedures: None  Discharge Exam: General: Appear in no distress, no Rash; Oral Mucosa Clear, moist. Cardiovascular: S1 and S2 Present, no Murmur, Respiratory: normal respiratory effort, Bilateral Air entry present and no Crackles, no wheezes Abdomen: Bowel Sound present, Soft and no tenderness, no hernia Extremities: no Pedal edema, no calf tenderness Neurology: alert and oriented to time, place, and person affect appropriate.  Filed Weights   07/26/22 1243 07/27/22 1643  Weight: 85.5 kg 88.5 kg   Vitals:   07/28/22 0616 07/28/22 0748  BP: (!) 141/88 126/70  Pulse: 65 (!) 59  Resp: 20 17  Temp: 98.1 F (36.7 C) 98.7 F (37.1 C)  SpO2: 95%  95%    DISCHARGE MEDICATION: Allergies as of 07/28/2022       Reactions   Carvedilol Other (See Comments)   "makes me a zombie" Other reaction(s): Abdominal Pain, Hallucination Lethargic    Simvastatin    Other reaction(s): Muscle Pain        Medication List     TAKE these medications    amLODipine 2.5 MG tablet Commonly known as: NORVASC Take 1 tablet (2.5 mg total) by mouth daily.   amoxicillin-clavulanate 875-125 MG tablet Commonly known as: AUGMENTIN Take 1 tablet by mouth 2 (two) times daily for 5 days.   CENTRUM SILVER PO Take 1 tablet by mouth daily.   folic acid 400 MCG tablet Commonly known as: FOLVITE Take 1 tablet by mouth daily.   GLUCOSAMINE CHONDROITIN JOINT PO Take 2 tablets by mouth daily.   lisinopril 20 MG tablet Commonly known as: ZESTRIL Take 1 tablet (20 mg total) by mouth daily.   propranolol ER 60 MG 24 hr capsule Commonly known as: INDERAL LA TAKE 3 CAPSULES BY MOUTH DAILY. TAKE 2 CAPSULES BY MOUTH ONCE EVERY MORNING AND1 CAPSULE BY MOUTH ONCE EVERY EVENING. Skip the dose if low blood pressure and low heart rate (SBP<120 and or Hr <65) What changed: additional instructions   rosuvastatin 10 MG tablet Commonly known as: Crestor Take 1 tablet (10 mg total) by mouth daily.               Durable Medical Equipment  (From admission,  onward)           Start     Ordered   07/28/22 1056  DME Walker  Once       Question Answer Comment  Walker: With 5 Inch Wheels   Patient needs a walker to treat with the following condition Ambulatory dysfunction      07/28/22 1056           Allergies  Allergen Reactions   Carvedilol Other (See Comments)    "makes me a zombie" Other reaction(s): Abdominal Pain, Hallucination Lethargic    Simvastatin     Other reaction(s): Muscle Pain   Discharge Instructions     Ambulatory referral to Cardiology   Complete by: As directed    For bradycardia and patient is on propranolol for  tremors.  Please eval.  Thank you   Call MD for:  difficulty breathing, headache or visual disturbances   Complete by: As directed    Call MD for:  extreme fatigue   Complete by: As directed    Call MD for:  persistant dizziness or light-headedness   Complete by: As directed    Call MD for:  persistant nausea and vomiting   Complete by: As directed    Call MD for:  severe uncontrolled pain   Complete by: As directed    Diet - low sodium heart healthy   Complete by: As directed    Discharge instructions   Complete by: As directed    Follow-up with PCP in 1 week, discontinue or decrease dose of propranolol due to bradycardia and monitor BP and heart rate at home. Skip the dose of propranolol if low blood pressure and low heart rate (SBP<120 and or Hr <65) Follow with cardiology for bradycardia and use of propranolol for tremors. Follow-up with neurology for further management of tremors   Increase activity slowly   Complete by: As directed        The results of significant diagnostics from this hospitalization (including imaging, microbiology, ancillary and laboratory) are listed below for reference.    Significant Diagnostic Studies: ECHOCARDIOGRAM COMPLETE  Result Date: 07/27/2022    ECHOCARDIOGRAM REPORT   Patient Name:   RUBERT FREDIANI Date of Exam: 07/27/2022 Medical Rec #:  [id number]    Height:       69.0 in Accession #:    [id number]   Weight:       188.6 lb Date of Birth:  1945/10/26    BSA:          2.015 m Patient Age:    76 years     BP:           105/62 mmHg Patient Gender: M            HR:           60 bpm. Exam Location:  [facility] Procedure: 2D Echo, Cardiac Doppler and Color Doppler Indications:     Syncope R55  History:         Patient has no prior history of Echocardiogram examinations.                  Stroke; Risk Factors:Dyslipidemia and Hypertension. CKD.  Sonographer:     Cristela Blue Referring Phys:  [id number] Verdene Lennert Diagnosing Phys: Marcina Millard MD IMPRESSIONS  1.  Left ventricular ejection fraction, by estimation, is 55 to 60%. The left ventricle has normal function. The left ventricle has no regional wall motion abnormalities. Left ventricular diastolic parameters are  consistent with Grade I diastolic dysfunction (impaired relaxation).  2. Right ventricular systolic function is normal. The right ventricular size is normal.  3. The mitral valve is normal in structure. Mild mitral valve regurgitation. No evidence of mitral stenosis.  4. The aortic valve is normal in structure. Aortic valve regurgitation is not visualized. No aortic stenosis is present.  5. The inferior vena cava is normal in size with greater than 50% respiratory variability, suggesting right atrial pressure of 3 mmHg. FINDINGS  Left Ventricle: Left ventricular ejection fraction, by estimation, is 55 to 60%. The left ventricle has normal function. The left ventricle has no regional wall motion abnormalities. The left ventricular internal cavity size was normal in size. There is  no left ventricular hypertrophy. Left ventricular diastolic parameters are consistent with Grade I diastolic dysfunction (impaired relaxation). Right Ventricle: The right ventricular size is normal. No increase in right ventricular wall thickness. Right ventricular systolic function is normal. Left Atrium: Left atrial size was normal in size. Right Atrium: Right atrial size was normal in size. Pericardium: There is no evidence of pericardial effusion. Mitral Valve: The mitral valve is normal in structure. Mild mitral valve regurgitation. No evidence of mitral valve stenosis. MV peak gradient, 4.6 mmHg. The mean mitral valve gradient is 2.0 mmHg. Tricuspid Valve: The tricuspid valve is normal in structure. Tricuspid valve regurgitation is mild . No evidence of tricuspid stenosis. Aortic Valve: The aortic valve is normal in structure. Aortic valve regurgitation is not visualized. No aortic stenosis is present. Aortic valve mean gradient  measures 4.0 mmHg. Aortic valve peak gradient measures 7.6 mmHg. Aortic valve area, by VTI measures 2.29 cm. Pulmonic Valve: The pulmonic valve was normal in structure. Pulmonic valve regurgitation is not visualized. No evidence of pulmonic stenosis. Aorta: The aortic root is normal in size and structure. Venous: The inferior vena cava is normal in size with greater than 50% respiratory variability, suggesting right atrial pressure of 3 mmHg. IAS/Shunts: No atrial level shunt detected by color flow Doppler.  LEFT VENTRICLE PLAX 2D LVIDd:         4.20 cm   Diastology LVIDs:         2.90 cm   LV e' medial:    6.53 cm/s LV PW:         1.10 cm   LV E/e' medial:  8.8 LV IVS:        1.30 cm   LV e' lateral:   6.53 cm/s LVOT diam:     2.00 cm   LV E/e' lateral: 8.8 LV SV:         53 LV SV Index:   26 LVOT Area:     3.14 cm  RIGHT VENTRICLE RV Basal diam:  3.30 cm RV Mid diam:    3.40 cm RV S prime:     14.60 cm/s TAPSE (M-mode): 2.0 cm LEFT ATRIUM             Index        RIGHT ATRIUM           Index LA diam:        1.80 cm 0.89 cm/m   RA Area:     20.80 cm LA Vol (A2C):   39.4 ml 19.55 ml/m  RA Volume:   64.10 ml  31.81 ml/m LA Vol (A4C):   50.7 ml 25.16 ml/m LA Biplane Vol: 45.1 ml 22.38 ml/m  AORTIC VALVE AV Area (Vmax):    1.95 cm AV Area (  Vmean):   1.72 cm AV Area (VTI):     2.29 cm AV Vmax:           137.50 cm/s AV Vmean:          94.000 cm/s AV VTI:            0.230 m AV Peak Grad:      7.6 mmHg AV Mean Grad:      4.0 mmHg LVOT Vmax:         85.40 cm/s LVOT Vmean:        51.400 cm/s LVOT VTI:          0.168 m LVOT/AV VTI ratio: 0.73  AORTA Ao Root diam: 3.70 cm MITRAL VALVE                TRICUSPID VALVE MV Area (PHT): 2.77 cm     TR Peak grad:   11.3 mmHg MV Area VTI:   1.96 cm     TR Vmax:        168.00 cm/s MV Peak grad:  4.6 mmHg MV Mean grad:  2.0 mmHg     SHUNTS MV Vmax:       1.07 m/s     Systemic VTI:  0.17 m MV Vmean:      64.1 cm/s    Systemic Diam: 2.00 cm MV Decel Time: 274 msec MV E  velocity: 57.60 cm/s MV A velocity: 102.00 cm/s MV E/A ratio:  0.56 Marcina Millard MD Electronically signed by Marcina Millard MD Signature Date/Time: 07/27/2022/1:25:26 PM    Final    CT ABDOMEN PELVIS W CONTRAST  Result Date: 07/26/2022 CLINICAL DATA:  Abdominal pain. EXAM: CT ABDOMEN AND PELVIS WITH CONTRAST TECHNIQUE: Multidetector CT imaging of the abdomen and pelvis was performed using the standard protocol following bolus administration of intravenous contrast. RADIATION DOSE REDUCTION: This exam was performed according to the departmental dose-optimization program which includes automated exposure control, adjustment of the mA and/or kV according to patient size and/or use of iterative reconstruction technique. CONTRAST:  OMNIPAQUE IOHEXOL 300 MG/ML  SOLN COMPARISON:  None Available. FINDINGS: Lower chest: Dependent atelectasis noted in both lower lobes. Hepatobiliary: No suspicious focal abnormality within the liver parenchyma. Tiny calcified gallstones evident. No intrahepatic or extrahepatic biliary dilation. Pancreas: No focal mass lesion. No dilatation of the main duct. No intraparenchymal cyst. No peripancreatic edema. Spleen: No splenomegaly. No focal mass lesion. Adrenals/Urinary Tract: No adrenal nodule or mass. Right kidney surgically absent. Multiple low-density left renal lesions evident measuring up to 6.1 cm. Generally these are well-defined and homogeneous without definite enhancing components and probably represent a combination of Bosniak I and II benign cyst. Assessment is limited by lack of precontrast imaging. Mild left hydronephrosis noted, appearance accentuated by superimposed central sinus cyst. Mild fullness in the proximal and mid left ureter with decompressed distal left ureter. The urinary bladder appears normal for the degree of distention. Stomach/Bowel: Stomach is unremarkable. No gastric wall thickening. No evidence of outlet obstruction. Duodenum is normally  positioned as is the ligament of Treitz. No small bowel wall thickening. No small bowel dilatation. The terminal ileum is normal. The appendix is normal. Diverticuli are seen scattered along the entire length of the colon without CT findings of diverticulitis. Vascular/Lymphatic: There is moderate atherosclerotic calcification of the abdominal aorta without aneurysm. There is no gastrohepatic or hepatoduodenal ligament lymphadenopathy. No retroperitoneal or mesenteric lymphadenopathy. No pelvic sidewall lymphadenopathy. Reproductive: Hysterectomy.  There is no adnexal mass. Other: No intraperitoneal  free fluid. Musculoskeletal: Small left groin hernia contains only fat. Status post right groin herniorrhaphy with mesh. No worrisome lytic or sclerotic osseous abnormality. Degenerative changes noted right hip. IMPRESSION: 1. Mild left hydronephrosis, appearance accentuated by superimposed central sinus cysts. Mild fullness in the proximal and mid left ureter with decompressed distal left ureter. No obstructing stone or mass lesion evident. Correlation for hematuria recommended and hematuria protocol CT may prove helpful to further evaluate as clinically warranted. 2. Status post right nephrectomy. 3. Multiple low-density left renal lesions measuring up to 6.1 cm. Generally, these are well-defined and homogeneous without definite enhancing components and probably represent a combination of Bosniak I and II benign cyst. Assessment is limited by lack of precontrast imaging. MRI abdomen or CT without and with contrast could be used to further evaluate as clinically warranted. 4. Cholelithiasis. 5. Colonic diverticulosis without diverticulitis. 6. Small left groin hernia contains only fat. 7.  Aortic Atherosclerosis (ICD10-I70.0). Electronically Signed   By: Kennith Center M.D.   On: 07/26/2022 15:32   CT HEAD WO CONTRAST ( )  Result Date: 07/26/2022 CLINICAL DATA:  Near syncope.  Mental status changes. EXAM: CT HEAD  WITHOUT CONTRAST TECHNIQUE: Contiguous axial images were obtained from the base of the skull through the vertex without intravenous contrast. RADIATION DOSE REDUCTION: This exam was performed according to the departmental dose-optimization program which includes automated exposure control, adjustment of the mA and/or kV according to patient size and/or use of iterative reconstruction technique. COMPARISON:  None Available. FINDINGS: Brain: There is no evidence for acute hemorrhage, hydrocephalus, mass lesion, or abnormal extra-axial fluid collection. No definite CT evidence for acute infarction. Patchy low attenuation in the deep hemispheric and periventricular white matter is nonspecific, but likely reflects chronic microvascular ischemic demyelination. Vascular: No hyperdense vessel or unexpected calcification. Skull: No evidence for fracture. No worrisome lytic or sclerotic lesion. Sinuses/Orbits: The visualized paranasal sinuses and mastoid air cells are clear. Visualized portions of the globes and intraorbital fat are unremarkable. Other: None. IMPRESSION: 1. No acute intracranial abnormality. 2. Chronic small vessel ischemic disease. Electronically Signed   By: Kennith Center M.D.   On: 07/26/2022 15:26   DG Chest Portable 1 View  Result Date: 07/26/2022 CLINICAL DATA:  Near-syncope EXAM: PORTABLE CHEST 1 VIEW COMPARISON:  None Available. FINDINGS: Cardiac silhouette appears prominent. No pneumonia or pulmonary edema. No pneumothorax or pleural effusion. Aorta is calcified. IMPRESSION: Enlarged cardiac silhouette.  No focal consolidation. Electronically Signed   By: Layla Maw M.D.   On: 07/26/2022 13:16   [place] LIMITED JOINT SPACE STRUCTURES UP LEFT  Result Date: 07/19/2022 Procedure:  Injection after aspiration of left elbow bursa under ultrasound guidance. Ultrasound guidance utilized to evaluate area of olecranon fluctuance and confirm full aspiration Samsung HS60 device utilized with permanent  recording / reporting. Verbal informed consent obtained and verified. Skin prepped in a sterile fashion. Ethyl chloride for topical local analgesia. Completed without difficulty and tolerated well. Aspirate: 10 mL straw-colored non-inflammatory fluid Medication: triamcinolone acetonide 40 mg/mL suspension for injection 1 mL total and 2 mL lidocaine 1% without epinephrine utilized for needle placement anesthetic Advised to contact for fevers/chills, erythema, induration, drainage, or persistent bleeding.   DG Elbow Complete Left  Result Date: 07/10/2022 CLINICAL DATA:  Pain after fall.  Bursitis for 1 month EXAM: LEFT ELBOW - COMPLETE 5 VIEW COMPARISON:  None Available. FINDINGS: No fracture or dislocation. Preserved joint spaces and bone mineralization. There is dorsal soft tissue thickening at the level of the olecranon.  Please correlate for history of bursitis. IMPRESSION: No acute osseous abnormality. Dorsal soft tissue swelling at the level of the olecranon consistent with the history of bursitis Electronically Signed   By: Karen Kays M.D.   On: 07/10/2022 11:00    Microbiology: Recent Results (from the past 240 hour(s))  SARS Coronavirus 2 by RT PCR (hospital order, performed in [facility] hospital lab) *cepheid single result test* Anterior Nasal Swab     Status: None   Collection Time: 07/26/22  2:43 PM   Specimen: Anterior Nasal Swab  Result Value Ref Range Status   SARS Coronavirus 2 by RT PCR NEGATIVE NEGATIVE Final    Comment: (NOTE) SARS-CoV-2 target nucleic acids are NOT DETECTED.  The SARS-CoV-2 RNA is generally detectable in upper and lower respiratory specimens during the acute phase of infection. The lowest concentration of SARS-CoV-2 viral copies this assay can detect is 250 copies / mL. A negative result does not preclude SARS-CoV-2 infection and should not be used as the sole basis for treatment or other patient management decisions.  A negative result may occur  with improper specimen collection / handling, submission of specimen other than nasopharyngeal swab, presence of viral mutation(s) within the areas targeted by this assay, and inadequate number of viral copies (<250 copies / mL). A negative result must be combined with clinical observations, patient history, and epidemiological information.  Fact Sheet for Patients:   RoadLapTop.co.za  Fact Sheet for Healthcare Providers: http://kim-miller.com/  This test is not yet approved or  cleared by the [place] FDA and has been authorized for detection and/or diagnosis of SARS-CoV-2 by FDA under an Emergency Use Authorization (EUA).  This EUA will remain in effect (meaning this test can be used) for the duration of the COVID-19 declaration under Section 564(b)(1) of the Act, 21 U.S.C. section 360bbb-3(b)(1), unless the authorization is terminated or revoked sooner.  Performed at [facility], [address] [place], [place] [address]   Gastrointestinal Panel by PCR , Stool     Status: None   Collection Time: 07/27/22 11:30 AM   Specimen: STOOL  Result Value Ref Range Status   Campylobacter species NOT DETECTED NOT DETECTED Final   Plesimonas shigelloides NOT DETECTED NOT DETECTED Final   Salmonella species NOT DETECTED NOT DETECTED Final   Yersinia enterocolitica NOT DETECTED NOT DETECTED Final   Vibrio species NOT DETECTED NOT DETECTED Final   Vibrio cholerae NOT DETECTED NOT DETECTED Final   Enteroaggregative E coli (EAEC) NOT DETECTED NOT DETECTED Final   Enteropathogenic E coli (EPEC) NOT DETECTED NOT DETECTED Final   Enterotoxigenic E coli (ETEC) NOT DETECTED NOT DETECTED Final   Shiga like toxin producing E coli (STEC) NOT DETECTED NOT DETECTED Final   Shigella/Enteroinvasive E coli (EIEC) NOT DETECTED NOT DETECTED Final   Cryptosporidium NOT DETECTED NOT DETECTED Final   Cyclospora cayetanensis NOT DETECTED NOT DETECTED  Final   Entamoeba histolytica NOT DETECTED NOT DETECTED Final   Giardia lamblia NOT DETECTED NOT DETECTED Final   Adenovirus F40/41 NOT DETECTED NOT DETECTED Final   Astrovirus NOT DETECTED NOT DETECTED Final   Norovirus GI/GII NOT DETECTED NOT DETECTED Final   Rotavirus A NOT DETECTED NOT DETECTED Final   Sapovirus (I, II, IV, and V) NOT DETECTED NOT DETECTED Final    Comment: Performed at [facility], [address] [place], [place] [address]  C Difficile Quick Screen w PCR reflex     Status: None   Collection Time: 07/27/22 11:31 AM  Specimen: STOOL  Result Value Ref Range Status   C Diff antigen NEGATIVE NEGATIVE Final   C Diff toxin NEGATIVE NEGATIVE Final   C Diff interpretation No C. difficile detected.  Final    Comment: Performed at [facility], [address] [address] [place], [place] [address]     Labs: CBC: Recent Labs  Lab 07/26/22 1248 07/27/22 0408 07/28/22 0429  WBC 13.1* 11.8* 12.3*  NEUTROABS 11.0*  --   --   HGB 15.4 14.4 15.3  HCT 46.3 43.6 46.0  MCV 98.5 98.2 98.5  PLT 144* 135* 155   Basic Metabolic Panel: Recent Labs  Lab 07/26/22 1248 07/27/22 0408 07/28/22 0429  NA 140 140 142  K 4.0 4.1 3.5  CL 108 108 110  CO2 27 25 26   GLUCOSE 124* 120* 110*  BUN 20 20 16   CREATININE 1.22 1.29* 1.27*  CALCIUM 9.3 9.2 9.4  MG  --  2.0 2.0  PHOS  --  3.8 2.6   Liver Function Tests: Recent Labs  Lab 07/26/22 1248 07/27/22 0408  AST 21 24  ALT 18 17  ALKPHOS 68 75  BILITOT 1.2 1.2  PROT 6.4* 5.9*  ALBUMIN 3.5 3.2*   No results for input(s): "LIPASE", "AMYLASE" in the last 168 hours. No results for input(s): "AMMONIA" in the last 168 hours. Cardiac Enzymes: No results for input(s): "CKTOTAL", "CKMB", "CKMBINDEX", "TROPONINI" in the last 168 hours. BNP (last 3 results) Recent Labs    07/26/22 1248  BNP 92.3   CBG: No results for input(s): "GLUCAP" in the last 168 hours.  Time spent: 35 minutes  Signed:  Gillis Santa  [facility]  07/28/2022 11:50 AM

## 2022-07-29 ENCOUNTER — Telehealth: Payer: Self-pay | Admitting: *Deleted

## 2022-07-29 NOTE — Transitions of Care (Post Inpatient/ED Visit) (Signed)
   07/29/2022  Name: Jason Krause MRN: [id number] DOB: 03-25-1946  Today's TOC FU Call Status: Today's TOC FU Call Status:: Successful TOC FU Call Competed TOC FU Call Complete Date: 07/29/22  Transition Care Management Follow-up Telephone Call Date of Discharge: 07/28/22 Discharge Facility: [facility]) Type of Discharge: Inpatient Admission Primary Inpatient Discharge Diagnosis:: Pre-syncope How have you been since you were released from the hospital?: Better Any questions or concerns?: No  Items Reviewed: Did you receive and understand the discharge instructions provided?: Yes Medications obtained and verified?: Yes (Medications Reviewed) Any new allergies since your discharge?: No Dietary orders reviewed?: No Do you have support at home?: Yes People in Home: spouse Name of Support/Comfort Primary Source: [facility] and Equipment/Supplies: Were Home Health Services Ordered?: No Any new equipment or medical supplies ordered?: Yes Name of Medical supply agency?: [employer]) Were you able to get the equipment/medical supplies?: Yes Do you have any questions related to the use of the equipment/supplies?: No  Functional Questionnaire: Do you need assistance with bathing/showering or dressing?: No Do you need assistance with meal preparation?: Yes Do you need assistance with eating?: No Do you have difficulty maintaining continence: No Do you need assistance with getting out of bed/getting out of a chair/moving?: No Do you have difficulty managing or taking your medications?: No  Follow up appointments reviewed: PCP Follow-up appointment confirmed?: Yes Date of PCP follow-up appointment?: 08/12/22 Follow-up Provider: Dr Elizabeth Sauer (RN discussed with patient about making an earlier appt. Patient stated he would call himself. RN explained that it needs to be with 7-14 days from discharge.) Specialist Hospital Follow-up appointment  confirmed?: Yes Date of Specialist follow-up appointment?: 08/17/22 Follow-Up Specialty Provider:: [id number] Urology 9:00, [id number] Cardiology 10:45  SDOH Interventions Today    Flowsheet Row Most Recent Value  SDOH Interventions   Food Insecurity Interventions Intervention Not Indicated  Housing Interventions Intervention Not Indicated  Transportation Interventions Intervention Not Indicated      Interventions Today    Flowsheet Row Most Recent Value  General Interventions   General Interventions Discussed/Reviewed General Interventions Discussed, General Interventions Reviewed, Doctor Visits  Doctor Visits Discussed/Reviewed Doctor Visits Reviewed, Doctor Visits Discussed  [Pt declined allowing RN to make Appt earlier. It is scheduled for [id number]. RN explained that it needs to be 7-14 days from discharge. Pt will make new appt self.]       Gean Maidens BSN RN [facility] [phone]

## 2022-08-02 ENCOUNTER — Encounter: Payer: Self-pay | Admitting: Family Medicine

## 2022-08-02 ENCOUNTER — Ambulatory Visit: Payer: Medicare PPO | Admitting: Family Medicine

## 2022-08-02 VITALS — BP 100/64 | HR 80 | Ht 69.0 in | Wt 195.0 lb

## 2022-08-02 DIAGNOSIS — R42 Dizziness and giddiness: Secondary | ICD-10-CM | POA: Diagnosis not present

## 2022-08-02 DIAGNOSIS — I10 Essential (primary) hypertension: Secondary | ICD-10-CM

## 2022-08-02 DIAGNOSIS — R001 Bradycardia, unspecified: Secondary | ICD-10-CM

## 2022-08-02 NOTE — Progress Notes (Unsigned)
Cardiology Office Note  Date:  08/03/2022   ID:  Jason Krause, DOB [birth year], MRN [id number]  PCP:  Jason Limerick, MD   Chief Complaint  Patient presents with   New Patient (Initial Visit)    Ref by Dr. Lucianne Muss from [facility] ER; Near syncope & sinus bradycardia.  Medications reviewed by the patient verbally.     HPI:  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 Lucianne Muss for near syncope, bradycardia  Recent hospitalization April 2024 for near syncope Working on his truck in morning, started feeling unwell, went inside, sat down, wife tried to help him get up to go to the bathroom but he was too weak to stand  Developed significant N/V Called EMTs Continue N/V for 24 hours, loose bowel movements Bradycardia noted in the emergency room and after admission, propranolol held Was treated with antibiotics for elevated white count, discharged on Augmentin 5 days  On today's visit still feels weak but improved, no further nausea vomiting Orthostatics negative on today's visit, standing blood pressure 110/73 Resting heart rate 70s  Cardiac imaging reviewed TTE shows LVEF 55 to 60%, grade 1 diastolic dysfunction.  Was on propranolol for tremors  Denies any significant change in his tremor by holding propranolol in the past week  EKG personally reviewed by myself on todays visit Normal sinus rhythm rate 75 bpm right bundle branch block, left anterior fascicular block, PVC   PMH:   has a past medical history of Cancer, Chronic kidney disease, Hyperlipidemia, Hypertension, Prostate cancer, and Stroke.  PSH:    Past Surgical History:  Procedure Laterality Date   COLONOSCOPY WITH PROPOFOL N/A 12/06/2019   Procedure: COLONOSCOPY  and biopsy WITH PROPOFOL;  Surgeon: Midge Minium, MD;  Location: [facility];  Service: Endoscopy;  Laterality: N/A;  priority 4   HERNIA REPAIR     kidney removal due to cancer Right    POLYPECTOMY N/A 12/06/2019   Procedure: POLYPECTOMY;  Surgeon: Midge Minium, MD;  Location: [facility];  Service: Endoscopy;  Laterality: N/A;   PROSTATE SURGERY     removed    Current Outpatient Medications  Medication Sig Dispense Refill   folic acid (FOLVITE) 400 MCG tablet Take 1 tablet by mouth daily.     Glucos-Chondroit-Hyaluron-MSM (GLUCOSAMINE CHONDROITIN JOINT PO) Take 2 tablets by mouth daily.     lisinopril (ZESTRIL) 20 MG tablet Take 1 tablet (20 mg total) by mouth daily. 90 tablet 1   Multiple Vitamins-Minerals (CENTRUM SILVER PO) Take 1 tablet by mouth daily.     rosuvastatin (CRESTOR) 10 MG tablet Take 1 tablet (10 mg total) by mouth daily. 90 tablet 1   No current facility-administered medications for this visit.     Allergies:   Carvedilol and Simvastatin   Social History:  The patient  reports that he has never smoked. He has never been exposed to tobacco smoke. He has never used smokeless tobacco. He reports current alcohol use. He reports that he does not use drugs.   Family History:   family history includes Cancer in his father; Diabetes in his sister; Heart disease in his father and paternal grandmother; Stroke in his maternal grandmother.    Review of Systems: Review of Systems  Constitutional: Negative.  HENT: Negative.    Respiratory: Negative.    Cardiovascular: Negative.   Gastrointestinal: Negative.   Musculoskeletal: Negative.   Neurological: Negative.   Psychiatric/Behavioral: Negative.    All other systems reviewed and are negative.    PHYSICAL EXAM: VS:  BP 114/81 (BP Location: Left Arm, Patient Position: Sitting, Cuff Size: Normal)   Ht 5\' 9"  (1.753 m)   Wt 195 lb 8 oz (88.7 kg)   SpO2 95%   BMI 28.87 kg/m  , BMI Body mass index is  28.87 kg/m. GEN: Well nourished, well developed, in no acute distress HEENT: normal Neck: no JVD, carotid bruits, or masses Cardiac: RRR; no murmurs, rubs, or gallops,no edema  Respiratory:  clear to auscultation bilaterally, normal work of breathing GI: soft, nontender, nondistended, + BS MS: no deformity or atrophy Skin: warm and dry, no rash Neuro:  Strength and sensation are intact Psych: euthymic mood, full affect  Recent Labs: 07/26/2022: B Natriuretic Peptide 92.3; TSH 1.344 07/27/2022: ALT 17 07/28/2022: Magnesium 2.0 08/02/2022: BUN 24; Creatinine, Ser 1.51; Hemoglobin 17.0; Platelets 165; Potassium 4.9; Sodium 143    Lipid Panel Lab Results  Component Value Date   CHOL 157 06/04/2022   HDL 48 06/04/2022   LDLCALC 97 06/04/2022   TRIG 58 06/04/2022    Wt Readings from Last 3 Encounters:  08/03/22 195 lb 8 oz (88.7 kg)  08/02/22 195 lb (88.5 kg)  07/27/22 195 lb (88.5 kg)       ASSESSMENT AND PLAN:  Problem List Items Addressed This Visit       Cardiology Problems   Essential hypertension   Pre-syncope - Primary   Mixed hyperlipidemia     Other   CKD (chronic kidney disease) stage 3, GFR 30-59 ml/min   Essential tremor   SAH (subarachnoid hemorrhage)   Bradycardia Recently noticed in the hospital, was taking propranolol ER 120 in the morning and propranolol 60 ER in the evening Was on propranolol for tremor, started by neurology Propranolol held, reports no significant change in tremor, heart rate 70 to 80s with standing Orthostatics negative Recommended continue to hold propranolol   Near syncope Likely secondary to gastroenteritis in the setting of lisinopril, high-dose propranolol Unable to exclude vasovagal event with nausea vomiting He has since recovered, blood pressure still remains low on lisinopril 20 off propranolol Recommend he continue to monitor blood pressure at home, stay hydrated If blood pressure runs low with standing, may need to  decrease lisinopril down to 10 mg daily  Essential hypertension As above blood pressure low but stable on lisinopril 20 Recommended daily hydrated, for low blood pressure with standing may need to decrease lisinopril down to 10 mg daily Would continue to hold propranolol  Stroke/subarachnoid hemorrhage Reports having event in 2018, details unclear, followed by neurology No history of tachyarrhythmia such as atrial fibrillation    Total encounter time more than 60 minutes  Greater than 50% was spent in counseling and coordination of care with the patient    Signed, Dossie Arbour, M.D., Ph.D. [facility] [place], [place] [phone]

## 2022-08-02 NOTE — Progress Notes (Signed)
Date:  08/02/2022   Name:  Jason Krause   DOB:  [birth year]   MRN:  [id number]   Chief Complaint: Hospitalization Follow-up (Admitted on 4/8 and d/c on 07/28/22. TOC on 07/29/22.)  Follow up Hospitalization  Patient was admitted to [facility] on 4/8 and discharged on 4/10. He was treated for presyncopy/bradycardia/hypotension. Treatment for this included fluids. Telephone follow up was done on 4/11 He reports excellent compliance with treatment. He reports this condition is improved.  ----------------------------------------------------------------------------------------- -       Lab Results  Component Value Date   NA 142 07/28/2022   K 3.5 07/28/2022   CO2 26 07/28/2022   GLUCOSE 110 (H) 07/28/2022   BUN 16 07/28/2022   CREATININE 1.27 (H) 07/28/2022   CALCIUM 9.4 07/28/2022   EGFR 51 (L) 06/04/2022   GFRNONAA 59 (L) 07/28/2022   Lab Results  Component Value Date   CHOL 157 06/04/2022   HDL 48 06/04/2022   LDLCALC 97 06/04/2022   TRIG 58 06/04/2022   CHOLHDL 3.6 11/06/2019   Lab Results  Component Value Date   TSH 1.344 07/26/2022   Lab Results  Component Value Date   HGBA1C 5.8 (H) 07/27/2022   Lab Results  Component Value Date   WBC 12.3 (H) 07/28/2022   HGB 15.3 07/28/2022   HCT 46.0 07/28/2022   MCV 98.5 07/28/2022   PLT 155 07/28/2022   Lab Results  Component Value Date   ALT 17 07/27/2022   AST 24 07/27/2022   ALKPHOS 75 07/27/2022   BILITOT 1.2 07/27/2022   No results found for: "25OHVITD2", "25OHVITD3", "VD25OH"   Review of Systems  Constitutional:  Positive for fatigue. Negative for diaphoresis, fever and unexpected weight change.  HENT:  Negative for congestion and trouble swallowing.   Eyes:  Negative for visual disturbance.  Respiratory:  Negative for chest tightness, shortness of breath and wheezing.   Cardiovascular:  Negative for chest pain, palpitations and leg swelling.  Gastrointestinal:  Negative for abdominal pain,  anal bleeding, diarrhea, nausea and vomiting.  Endocrine: Negative for polydipsia and polyuria.  Genitourinary:  Negative for difficulty urinating.    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   Pain in both knees 10/29/2020   Essential tremor 10/29/2020   Leg weakness, bilateral 09/02/2020   Leg pain 06/22/2020   Personal history of colonic polyps    Polyp of descending colon    CKD (chronic kidney disease) stage 3, GFR 30-59 ml/min 12/28/2017   Osteopenia 12/28/2017   Polyp of gallbladder 12/28/2017   Right bundle branch block 12/28/2017   Chronic kidney disease, stage 3 12/28/2017   SAH (subarachnoid hemorrhage) 11/11/2016   Essential hypertension 11/09/2016   Mixed hyperlipidemia 11/08/2016   Nontraumatic cortical hemorrhage of left cerebral hemisphere 11/08/2016   Personal history of renal cell carcinoma 11/08/2016   Cerebrovascular accident 10/18/2016   History of kidney cancer 01/19/2007   Carcinoma of prostate 01/12/2001    Allergies  Allergen Reactions   Carvedilol Other (See Comments)    "makes me a zombie" Other reaction(s): Abdominal Pain, Hallucination Lethargic    Simvastatin     Other reaction(s): Muscle Pain    Past Surgical History:  Procedure Laterality Date   COLONOSCOPY WITH PROPOFOL N/A 12/06/2019  Procedure: COLONOSCOPY and biopsy WITH PROPOFOL;  Surgeon: Midge Minium, MD;  Location: [facility];  Service: Endoscopy;  Laterality: N/A;  priority 4   HERNIA REPAIR     kidney removal due to cancer Right    POLYPECTOMY N/A 12/06/2019   Procedure: POLYPECTOMY;  Surgeon: Midge Minium, MD;  Location: [facility];  Service: Endoscopy;  Laterality: N/A;   PROSTATE SURGERY     removed    Social History    Tobacco Use   Smoking status: Never    Passive exposure: Never   Smokeless tobacco: Never  Vaping Use   Vaping Use: Never used  Substance Use Topics   Alcohol use: Yes    Comment: social   Drug use: Never     Medication list has been reviewed and updated.  Current Meds  Medication Sig   amLODipine (NORVASC) 2.5 MG tablet Take 1 tablet (2.5 mg total) by mouth daily.   amoxicillin-clavulanate (AUGMENTIN) 875-125 MG tablet Take 1 tablet by mouth 2 (two) times daily for 5 days.   folic acid (FOLVITE) 400 MCG tablet Take 1 tablet by mouth daily.   Glucos-Chondroit-Hyaluron-MSM (GLUCOSAMINE CHONDROITIN JOINT PO) Take 2 tablets by mouth daily.   lisinopril (ZESTRIL) 20 MG tablet Take 1 tablet (20 mg total) by mouth daily.   Multiple Vitamins-Minerals (CENTRUM SILVER PO) Take 1 tablet by mouth daily.   propranolol ER (INDERAL LA) 60 MG 24 hr capsule TAKE 3 CAPSULES BY MOUTH DAILY. TAKE 2 CAPSULES BY MOUTH ONCE EVERY MORNING AND1 CAPSULE BY MOUTH ONCE EVERY EVENING. Skip the dose if low blood pressure and low heart rate (SBP<120 and or Hr <65)   rosuvastatin (CRESTOR) 10 MG tablet Take 1 tablet (10 mg total) by mouth daily.       08/02/2022    1:27 PM 07/07/2022   11:20 AM 06/04/2022    9:32 AM 10/12/2021    9:05 AM  GAD 7 : Generalized Anxiety Score  Nervous, Anxious, on Edge 0 0 0 0  Control/stop worrying 0 0 0 0  Worry too much - different things 0 0 0 0  Trouble relaxing 0 0 0 0  Restless 0 0 0 0  Easily annoyed or irritable 0 0 0 0  Afraid - awful might happen 0 0 0   Total GAD 7 Score 0 0 0   Anxiety Difficulty Not difficult at all Not difficult at all Not difficult at all Not difficult at all       08/02/2022    1:26 PM 07/07/2022   11:20 AM 06/04/2022    9:32 AM  Depression screen PHQ 2/9  Decreased Interest 0 0 0  Down, Depressed, Hopeless 0 0 0  PHQ - 2 Score 0 0 0  Altered sleeping 0 0 0  Tired, decreased energy 0 0 0  Change in appetite 0 0 0  Feeling bad or  failure about yourself  0 0 0  Trouble concentrating 0 0 0  Moving slowly or fidgety/restless 0 0 0  Suicidal thoughts 0 0 0  PHQ-9 Score 0 0 0  Difficult doing work/chores Not difficult at all Not difficult at all Not difficult at all    BP Readings from Last 3 Encounters:  08/02/22 100/64  07/28/22 126/70  07/13/22 118/76    Physical Exam Vitals and nursing note reviewed.  HENT:     Head: Normocephalic.     Right Ear: External ear normal.     Left Ear: External ear normal.  Nose: Nose normal.  Eyes:     General: No scleral icterus.       Right eye: No discharge.        Left eye: No discharge.     Conjunctiva/sclera: Conjunctivae normal.     Pupils: Pupils are equal, round, and reactive to light.  Neck:     Thyroid: No thyromegaly.     Vascular: No JVD.     Trachea: No tracheal deviation.  Cardiovascular:     Rate and Rhythm: Normal rate and regular rhythm.     Heart sounds: Normal heart sounds. No murmur heard.    No friction rub. No gallop.  Pulmonary:     Effort: No respiratory distress.     Breath sounds: Normal breath sounds. No wheezing or rales.  Abdominal:     General: Bowel sounds are normal.     Palpations: Abdomen is soft. There is no mass.     Tenderness: There is no abdominal tenderness.  Musculoskeletal:     Cervical back: Normal range of motion and neck supple.  Lymphadenopathy:     Cervical: No cervical adenopathy.  Skin:    General: Skin is warm.  Neurological:     Mental Status: He is alert and oriented to person, place, and time.     Deep Tendon Reflexes: Reflexes are normal and symmetric.     Wt Readings from Last 3 Encounters:  08/02/22 195 lb (88.5 kg)  07/27/22 195 lb (88.5 kg)  07/07/22 186 lb (84.4 kg)    BP 100/64   Pulse 80   Ht  (1.753 m)   Wt 195 lb (88.5 kg)   SpO2 93%   BMI 28.80 kg/m  [facility] SPORTS [facility] PRIMARY C[facility] & SPORTS MEDICINE AT [facility]                                    Transitional Care Clinic   [facility] Discharge Acute Issues Care Follow Up                                                                        Patient Demographics  Rainen Vanrossum, is a 77 y.o. male  DOB 1946-03-22  MRN [id number].  Primary MD  Duanne Limerick, MD   Reason for TCC follow Up -multiple reasons including ensuring that cardiology is in place for follow-up, urology is in place for follow-up, nephrology is in place for follow-up labs including urinalysis and CBC and renal panel will obtain.  Also review of vital signs in particular pulse rate and blood pressure given the altered prescribing dosing of propranolol than what he has been taking in the past as well as consideration of other medications that may be deprescribed.   Past Medical History:  Diagnosis Date   Cancer    kidney and prostate   Chronic kidney disease    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   Hyperlipidemia    Hypertension    Prostate cancer    Stroke    slight weakness in right foot    Past Surgical History:  Procedure Laterality  Date   COLONOSCOPY WITH PROPOFOL N/A 12/06/2019   Procedure: COLONOSCOPY and biopsy WITH PROPOFOL;  Surgeon: Midge Minium, MD;  Location: [facility];  Service: Endoscopy;  Laterality: N/A;  priority 4   HERNIA REPAIR     kidney removal due to cancer Right    POLYPECTOMY N/A 12/06/2019   Procedure: POLYPECTOMY;  Surgeon: Midge Minium, MD;  Location: [facility];  Service: Endoscopy;  Laterality: N/A;   PROSTATE SURGERY     removed  Recent HPI and Hospital course: Upon return home patient has only had 2-3 doses of his propranolol and not the double dose in the morning.  His tremor has been unaffected either way whether he is taking it or has not taken the medication.  Blood pressure is been steady patient is noted to be somewhat hypotensive today at 100/64 and we will make some deprescribing changes.  Post Hospital acute care issues to be  followed in clinic: The following will be followed up We will repeat a UA given that he had a UTI and has just completed Augmentin.  #2 recheck of blood pressure and pulse and is currently taking lisinopril 20 amlodipine 2.5 mg and propranolol on an as-needed basis we will discontinue the propranolol and the amlodipine and continue only on lisinopril 20 mg.  Nausea and vomiting has resolved.  Hydronephrosis patient will be seen Dr. Signa Kell later in the month and we will have him follow-up on the hydronephrosis as well as patient will be seeing nephrology soon.  Patient has an upcoming appointment but this is in the distant future with neurology but we are going to monitor the tremors and if necessary involve neurology sooner than later and.  And third patient has had bradycardia and will be seeing cardiology off of the propranolol if pulse rate has not come up it may be necessary to see if patient has like a sick sinus situation that needs to be monitored.        Subjective:   Jason Krause today has, No headache, No chest pain, No abdominal pain - No Nausea, No new weakness tingling or numbness, No Cough - SOB.  No dizziness no nausea no diarrhea no dysuria/frequency.      Reason for frequent admissions/ER visits patient has not had frequent visits and has always been relatively stable with his blood pressure until recently when he was admitted with bradycardia and hypotension contributing to presyncopal status.      Objective:   Vitals:   08/02/22 1319  BP: 100/64  Pulse: 80  SpO2: 93%  Weight: 195 lb (88.5 kg)  Height: 5\' 9"  (1.753 m)    Wt Readings from Last 3 Encounters:  08/02/22 195 lb (88.5 kg)  07/27/22 195 lb (88.5 kg)  07/07/22 186 lb (84.4 kg)    Allergies as of 08/02/2022       Reactions   Carvedilol Other (See Comments)   "makes me a zombie" Other reaction(s): Abdominal Pain, Hallucination Lethargic    Simvastatin    Other reaction(s): Muscle Pain         Medication List        Accurate as of August 02, 2022  2:26 PM. If you have any questions, ask your nurse or doctor.          STOP taking these medications    amLODipine 2.5 MG tablet Commonly known as: NORVASC Stopped by: Elizabeth Sauer, MD   propranolol ER 60 MG 24 hr capsule Commonly known as: INDERAL  LA Stopped by: Elizabeth Sauer, MD       TAKE these medications    amoxicillin-clavulanate 875-125 MG tablet Commonly known as: AUGMENTIN Take 1 tablet by mouth 2 (two) times daily for 5 days.   CENTRUM SILVER PO Take 1 tablet by mouth daily.   folic acid 400 MCG tablet Commonly known as: FOLVITE Take 1 tablet by mouth daily.   GLUCOSAMINE CHONDROITIN JOINT PO Take 2 tablets by mouth daily.   lisinopril 20 MG tablet Commonly known as: ZESTRIL Take 1 tablet (20 mg total) by mouth daily.   rosuvastatin 10 MG tablet Commonly known as: Crestor Take 1 tablet (10 mg total) by mouth daily.         Physical Exam: Constitutional: Patient appears well-developed and well-nourished. Not in obvious distress. HENT: Normocephalic, atraumatic, External right and left ear normal. Oropharynx is clear and moist.  Eyes: Conjunctivae and EOM are normal. PERRLA, no scleral icterus. Neck: Normal ROM. Neck supple. No JVD. No tracheal deviation. No thyromegaly. CVS: RRR, S1/S2 +, no murmurs, no gallops, no carotid bruit.  Pulmonary: Effort and breath sounds normal, no stridor, rhonchi, wheezes, rales.  Abdominal: Soft. BS +, no distension, tenderness, rebound or guarding.  Musculoskeletal: Normal range of motion. No edema and no tenderness.  Lymphadenopathy: No lymphadenopathy noted, cervical, inguinal or axillary Neuro: Alert. Normal reflexes, muscle tone coordination. No cranial nerve deficit. Skin: Skin is warm and dry. No rash noted. Not diaphoretic. No erythema. No pallor. Psychiatric: Normal mood and affect. Behavior, judgment, thought content normal.   Data Review    Micro Results Recent Results (from the past 240 hour(s))  SARS Coronavirus 2 by RT PCR (hospital order, performed in [facility] hospital lab) *cepheid single result test* Anterior Nasal Swab     Status: None   Collection Time: 07/26/22  2:43 PM   Specimen: Anterior Nasal Swab  Result Value Ref Range Status   SARS Coronavirus 2 by RT PCR NEGATIVE NEGATIVE Final    Comment: (NOTE) SARS-CoV-2 target nucleic acids are NOT DETECTED.  The SARS-CoV-2 RNA is generally detectable in upper and lower respiratory specimens during the acute phase of infection. The lowest concentration of SARS-CoV-2 viral copies this assay can detect is 250 copies / mL. A negative result does not preclude SARS-CoV-2 infection and should not be used as the sole basis for treatment or other patient management decisions.  A negative result may occur with improper specimen collection / handling, submission of specimen other than nasopharyngeal swab, presence of viral mutation(s) within the areas targeted by this assay, and inadequate number of viral copies (<250 copies / mL). A negative result must be combined with clinical observations, patient history, and epidemiological information.  Fact Sheet for Patients:   RoadLapTop.co.za  Fact Sheet for Healthcare Providers: http://kim-miller.com/  This test is not yet approved or  cleared by the [place] FDA and has been authorized for detection and/or diagnosis of SARS-CoV-2 by FDA under an Emergency Use Authorization (EUA).  This EUA will remain in effect (meaning this test can be used) for the duration of the COVID-19 declaration under Section 564(b)(1) of the Act, 21 U.S.C. section 360bbb-3(b)(1), unless the authorization is terminated or revoked sooner.  Performed at [facility], [address] [place], [place] [address]   Gastrointestinal Panel by PCR , Stool     Status: None   Collection Time:  07/27/22 11:30 AM   Specimen: STOOL  Result Value Ref Range Status   Campylobacter species NOT DETECTED  NOT DETECTED Final   Plesimonas shigelloides NOT DETECTED NOT DETECTED Final   Salmonella species NOT DETECTED NOT DETECTED Final   Yersinia enterocolitica NOT DETECTED NOT DETECTED Final   Vibrio species NOT DETECTED NOT DETECTED Final   Vibrio cholerae NOT DETECTED NOT DETECTED Final   Enteroaggregative E coli (EAEC) NOT DETECTED NOT DETECTED Final   Enteropathogenic E coli (EPEC) NOT DETECTED NOT DETECTED Final   Enterotoxigenic E coli (ETEC) NOT DETECTED NOT DETECTED Final   Shiga like toxin producing E coli (STEC) NOT DETECTED NOT DETECTED Final   Shigella/Enteroinvasive E coli (EIEC) NOT DETECTED NOT DETECTED Final   Cryptosporidium NOT DETECTED NOT DETECTED Final   Cyclospora cayetanensis NOT DETECTED NOT DETECTED Final   Entamoeba histolytica NOT DETECTED NOT DETECTED Final   Giardia lamblia NOT DETECTED NOT DETECTED Final   Adenovirus F40/41 NOT DETECTED NOT DETECTED Final   Astrovirus NOT DETECTED NOT DETECTED Final   Norovirus GI/GII NOT DETECTED NOT DETECTED Final   Rotavirus A NOT DETECTED NOT DETECTED Final   Sapovirus (I, II, IV, and V) NOT DETECTED NOT DETECTED Final    Comment: Performed at [facility], [address] [place], [place] [address]  C Difficile Quick Screen w PCR reflex     Status: None   Collection Time: 07/27/22 11:31 AM   Specimen: STOOL  Result Value Ref Range Status   C Diff antigen NEGATIVE NEGATIVE Final   C Diff toxin NEGATIVE NEGATIVE Final   C Diff interpretation No C. difficile detected.  Final    Comment: Performed at [facility], [address] [place], [place] [address]     CBC Recent Labs  Lab 07/27/22 0408 07/28/22 0429  WBC 11.8* 12.3*  HGB 14.4 15.3  HCT 43.6 46.0  PLT 135* 155  MCV 98.2 98.5  MCH 32.4 32.8  MCHC 33.0 33.3  RDW 12.9 12.9    Chemistries  Recent Labs  Lab 07/27/22 0408  07/28/22 0429  NA 140 142  K 4.1 3.5  CL 108 110  CO2 25 26  GLUCOSE 120* 110*  BUN 20 16  CREATININE 1.29* 1.27*  CALCIUM 9.2 9.4  MG 2.0 2.0  AST 24  --   ALT 17  --   ALKPHOS 75  --   BILITOT 1.2  --    ------------------------------------------------------------------------------------------------------------------ estimated creatinine clearance is 55.3 mL/min (A) (by C-G formula based on SCr of 1.27 mg/dL (H)). ------------------------------------------------------------------------------------------------------------------ No results for input(s): "HGBA1C" in the last 72 hours. ------------------------------------------------------------------------------------------------------------------ No results for input(s): "CHOL", "HDL", "LDLCALC", "TRIG", "CHOLHDL", "LDLDIRECT" in the last 72 hours. ------------------------------------------------------------------------------------------------------------------ No results for input(s): "TSH", "T4TOTAL", "T3FREE", "THYROIDAB" in the last 72 hours.  Invalid input(s): "FREET3" ------------------------------------------------------------------------------------------------------------------ No results for input(s): "VITAMINB12", "FOLATE", "FERRITIN", "TIBC", "IRON", "RETICCTPCT" in the last 72 hours.  Coagulation profile No results for input(s): "INR", "PROTIME" in the last 168 hours.  No results for input(s): "DDIMER" in the last 72 hours.  Cardiac Enzymes No results for input(s): "CKMB", "TROPONINI", "MYOGLOBIN" in the last 168 hours.  Invalid input(s): "CK" ------------------------------------------------------------------------------------------------------------------ Invalid input(s): "POCBNP" Time spent in minutes: 30     Elizabeth Sauer M.D on 08/02/2022 at 2:26 PM   Disclaimer: This note may have been dictated with voice recognition software. Similar sounding words can inadvertently be transcribed and this note may  contain transcription errors which may not have been corrected upon publication of note.   Assessment and Plan: 1. Essential hypertension Chronic.  Controlled.  Stable.  Blood pressure today is  100/64 and this is with combination of lisinopril 20, amlodipine 2.5 mg, and the last dosing of propranolol ER 60 was the day before.  Blood pressure is even low on just the lisinopril and amlodipine and I am still concerned that patient may still be a little volume under loaded given the weight loss.  We will D prescribe the propranolol which is used primarily for tremors and amlodipine 2.5 mg.  We will recheck at pending next visit.  In the meantime we will check CBC a white count elevation, urinalysis for UTI that he is being treated with Augmentin and renal function panel for electrolytes and kidney GFR. - CBC with Differential/Platelet - Urinalysis, microscopic only - Renal Function Panel  2. Dizziness Chronic.  Improved.  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  Given the combination that patient became presyncopal and responded to a fluid challenge.  Patient seems to be doing better right now and as noted above I think we will pull back on some medications to see if blood pressure remains okay and if symptomatology improves. - Urinalysis, microscopic only - Renal Function Panel  3. Bradycardia Patient was also noted to be bradycardic has an upcoming cardiology appointment.  We will hold on the propranolol and see how this responds in addition to blood pressure readings as well.  Patient recently has seen Dr. Signa Kell and because the CT showed mild hydronephrosis with multiple cysts in his remaining kidney we will bring this to the attention of urology whether or not we need to look at any back pressure from the ureter causing any issues with this remaining kidney as well as  encouraging follow-up with nephrology.  Elizabeth Sauer, MD

## 2022-08-02 NOTE — Patient Instructions (Signed)
DASH Eating Plan DASH stands for Dietary Approaches to Stop Hypertension. The DASH eating plan is a healthy eating plan that has been shown to: Reduce high blood pressure (hypertension). Reduce your risk for type 2 diabetes, heart disease, and stroke. Help with weight loss. What are tips for following this plan? Reading food labels Check food labels for the amount of salt (sodium) per serving. Choose foods with less than 5 percent of the Daily Value of sodium. Generally, foods with less than 300 milligrams (mg) of sodium per serving fit into this eating plan. To find whole grains, look for the word "whole" as the first word in the ingredient list. Shopping Buy products labeled as "low-sodium" or "no salt added." Buy fresh foods. Avoid canned foods and pre-made or frozen meals. Cooking Avoid adding salt when cooking. Use salt-free seasonings or herbs instead of table salt or sea salt. Check with your health care provider or pharmacist before using salt substitutes. Do not fry foods. Cook foods using healthy methods such as baking, boiling, grilling, roasting, and broiling instead. Cook with heart-healthy oils, such as olive, canola, avocado, soybean, or sunflower oil. Meal planning  Eat a balanced diet that includes: 4 or more servings of fruits and 4 or more servings of vegetables each day. Try to fill one-half of your plate with fruits and vegetables. 6-8 servings of whole grains each day. Less than 6 oz (170 g) of lean meat, poultry, or fish each day. A 3-oz (85-g) serving of meat is about the same size as a deck of cards. One egg equals 1 oz (28 g). 2-3 servings of low-fat dairy each day. One serving is 1 cup (237 mL). 1 serving of nuts, seeds, or beans 5 times each week. 2-3 servings of heart-healthy fats. Healthy fats called omega-3 fatty acids are found in foods such as walnuts, flaxseeds, fortified milks, and eggs. These fats are also found in cold-water fish, such as sardines, salmon,  and mackerel. Limit how much you eat of: Canned or prepackaged foods. Food that is high in trans fat, such as some fried foods. Food that is high in saturated fat, such as fatty meat. Desserts and other sweets, sugary drinks, and other foods with added sugar. Full-fat dairy products. Do not salt foods before eating. Do not eat more than 4 egg yolks a week. Try to eat at least 2 vegetarian meals a week. Eat more home-cooked food and less restaurant, buffet, and fast food. Lifestyle When eating at a restaurant, ask that your food be prepared with less salt or no salt, if possible. If you drink alcohol: Limit how much you use to: 0-1 drink a day for women who are not pregnant. 0-2 drinks a day for men. Be aware of how much alcohol is in your drink. In the [place] one drink equals one 12 oz bottle of beer (355 mL), one 5 oz glass of wine (148 mL), or one 1 oz glass of hard liquor (44 mL). General information Avoid eating more than 2,300 mg of salt a day. If you have hypertension, you may need to reduce your sodium intake to 1,500 mg a day. Work with your health care provider to maintain a healthy body weight or to lose weight. Ask what an ideal weight is for you. Get at least 30 minutes of exercise that causes your heart to beat faster (aerobic exercise) most days of the week. Activities may include walking, swimming, or biking. Work with your health care provider or dietitian to   adjust your eating plan to your individual calorie needs. What foods should I eat? Fruits All fresh, dried, or frozen fruit. Canned fruit in natural juice (without added sugar). Vegetables Fresh or frozen vegetables (raw, steamed, roasted, or grilled). Low-sodium or reduced-sodium tomato and vegetable juice. Low-sodium or reduced-sodium tomato sauce and tomato paste. Low-sodium or reduced-sodium canned vegetables. Grains Whole-grain or whole-wheat bread. Whole-grain or whole-wheat pasta. Brown rice. Oatmeal. Quinoa.  Bulgur. Whole-grain and low-sodium cereals. Pita bread. Low-fat, low-sodium crackers. Whole-wheat flour tortillas. Meats and other proteins Skinless chicken or turkey. Ground chicken or turkey. Pork with fat trimmed off. Fish and seafood. Egg whites. Dried beans, peas, or lentils. Unsalted nuts, nut butters, and seeds. Unsalted canned beans. Lean cuts of beef with fat trimmed off. Low-sodium, lean precooked or cured meat, such as sausages or meat loaves. Dairy Low-fat (1%) or fat-free (skim) milk. Reduced-fat, low-fat, or fat-free cheeses. Nonfat, low-sodium ricotta or cottage cheese. Low-fat or nonfat yogurt. Low-fat, low-sodium cheese. Fats and oils Soft margarine without trans fats. Vegetable oil. Reduced-fat, low-fat, or light mayonnaise and salad dressings (reduced-sodium). Canola, safflower, olive, avocado, soybean, and sunflower oils. Avocado. Seasonings and condiments Herbs. Spices. Seasoning mixes without salt. Other foods Unsalted popcorn and pretzels. Fat-free sweets. The items listed above may not be a complete list of foods and beverages you can eat. Contact a dietitian for more information. What foods should I avoid? Fruits Canned fruit in a light or heavy syrup. Fried fruit. Fruit in cream or butter sauce. Vegetables Creamed or fried vegetables. Vegetables in a cheese sauce. Regular canned vegetables (not low-sodium or reduced-sodium). Regular canned tomato sauce and paste (not low-sodium or reduced-sodium). Regular tomato and vegetable juice (not low-sodium or reduced-sodium). Pickles. Olives. Grains Baked goods made with fat, such as croissants, muffins, or some breads. Dry pasta or rice meal packs. Meats and other proteins Fatty cuts of meat. Ribs. Fried meat. Bacon. Bologna, salami, and other precooked or cured meats, such as sausages or meat loaves. Fat from the back of a pig (fatback). Bratwurst. Salted nuts and seeds. Canned beans with added salt. Canned or smoked fish.  Whole eggs or egg yolks. Chicken or turkey with skin. Dairy Whole or 2% milk, cream, and half-and-half. Whole or full-fat cream cheese. Whole-fat or sweetened yogurt. Full-fat cheese. Nondairy creamers. Whipped toppings. Processed cheese and cheese spreads. Fats and oils Butter. Stick margarine. Lard. Shortening. Ghee. Bacon fat. Tropical oils, such as coconut, palm kernel, or palm oil. Seasonings and condiments Onion salt, garlic salt, seasoned salt, table salt, and sea salt. Worcestershire sauce. Tartar sauce. Barbecue sauce. Teriyaki sauce. Soy sauce, including reduced-sodium. Steak sauce. Canned and packaged gravies. Fish sauce. Oyster sauce. Cocktail sauce. Store-bought horseradish. Ketchup. Mustard. Meat flavorings and tenderizers. Bouillon cubes. Hot sauces. Pre-made or packaged marinades. Pre-made or packaged taco seasonings. Relishes. Regular salad dressings. Other foods Salted popcorn and pretzels. The items listed above may not be a complete list of foods and beverages you should avoid. Contact a dietitian for more information. Where to find more information National Heart, Lung, and Blood Institute: www.nhlbi.nih.gov American Heart Association: www.heart.org Academy of Nutrition and Dietetics: www.eatright.org National Kidney Foundation: www.kidney.org Summary The DASH eating plan is a healthy eating plan that has been shown to reduce high blood pressure (hypertension). It may also reduce your risk for type 2 diabetes, heart disease, and stroke. When on the DASH eating plan, aim to eat more fresh fruits and vegetables, whole grains, lean proteins, low-fat dairy, and heart-healthy fats. With the DASH   eating plan, you should limit salt (sodium) intake to 2,300 mg a day. If you have hypertension, you may need to reduce your sodium intake to 1,500 mg a day. Work with your health care provider or dietitian to adjust your eating plan to your individual calorie needs. This information is not  intended to replace advice given to you by your health care provider. Make sure you discuss any questions you have with your health care provider. Document Revised: 03/09/2019 Document Reviewed: 03/09/2019 Elsevier Patient Education  2023 [employer] Inc. GUIDELINES FOR  LOW-CHOLESTEROL, LOW-TRIGLYCERIDE DIETS    FOODS TO USE   MEATS, FISH Choose lean meats (chicken, turkey, veal, and non-fatty cuts of beef with excess fat trimmed; one serving = 3 oz of cooked meat). Also, fresh or frozen fish, canned fish packed in water, and shellfish (lobster, crabs, shrimp, and oysters). Limit use to no more than one serving of one of these per week. Shellfish are high in cholesterol but low in saturated fat and should be used sparingly. Meats and fish should be broiled (pan or oven) or baked on a rack.  EGGS Egg substitutes and egg whites (use freely). Egg yolks (limit two per week).  FRUITS Eat three servings of fresh fruit per day (1 serving =  cup). Be sure to have at least one citrus fruit daily. Frozen and canned fruit with no sugar or syrup added may be used.  VEGETABLES Most vegetables are not limited (see next page). One dark-green (string beans, escarole) or one deep yellow (squash) vegetable is recommended daily. Cauliflower, broccoli, and celery, as well as potato skins, are recommended for their fiber content. (Fiber is associated with cholesterol reduction) It is preferable to steam vegetables, but they may be boiled, strained, or braised with polyunsaturated vegetable oil (see below).  BEANS Dried peas or beans (1 serving =  cup) may be used as a bread substitute.  NUTS Almonds, walnuts, and peanuts may be used sparingly  (1 serving = 1 Tablespoonful). Use pumpkin, sesame, or sunflower seeds.  BREADS, GRAINS One roll or one slice of whole grain or enriched bread may be used, or three soda crackers or four pieces of melba toast as a substitute. Spaghetti, rice or noodles ( cup) or  large ear of  corn may be used as a bread substitute. In preparing these foods do not use butter or shortening, use soft margarine. Also use egg and sugar substitutes.  Choose high fiber grains, such as oats and whole wheat.  CEREALS Use  cup of hot cereal or  cup of cold cereal per day. Add a sugar substitute if desired, with 99% fat free or skim milk.  MILK PRODUCTS Always use 99% fat free or skim milk, dairy products such as low fat cheeses (farmer's uncreamed diet cottage), low-fat yogurt, and powdered skim milk.  FATS, OILS Use soft (not stick) margarine; vegetable oils that are high in polyunsaturated fats (such as safflower, sunflower, soybean, corn, and cottonseed). Always refrigerate meat drippings to harden the fat and remove it before preparing gravies  DESSERTS, SNACKS Limit to two servings per day; substitute each serving for a bread/cereal serving: ice milk, water sherbet (1/4 cup); unflavored gelatin or gelatin flavored with sugar substitute (1/3 cup); pudding prepared with skim milk (1/2 cup); egg white souffls; unbuttered popcorn (1  cups). Substitute carob for chocolate.  BEVERAGES Fresh fruit juices (limit 4 oz per day); black coffee, plain or herbal teas; soft drinks with sugar substitutes; club soda, preferably salt-free; cocoa   made with skim milk or nonfat dried milk and water (sugar substitute added if desired); clear broth. Alcohol: limit two servings per day (see second page).  MISCELLANEOUS  You may use the following freely: vinegar, spices, herbs, nonfat bouillon, mustard, Worcestershire sauce, soy sauce, flavoring essence.                  GUIDELINES FOR  LOW-CHOLESTEROL, LOW TRIGLYCERIDE DIETS    FOODS TO AVOID   MEATS, FISH Marbled beef, pork, bacon, sausage, and other pork products; fatty fowl (duck, goose); skin and fat of turkey and chicken; processed meats; luncheon meats (salami, bologna); frankfurters and fast-food hamburgers (theyre loaded with fat); organ  meats (kidneys, liver); canned fish packed in oil.  EGGS Limit egg yolks to two per week.   FRUITS Coconuts (rich in saturated fats).  VEGETABLES Avoid avocados. Starchy vegetables (potatoes, corn, lima beans, dried peas, beans) may be used only if substitutes for a serving of bread or cereal. (Baked potato skin, however, is desirable for its fiber content.  BEANS Commercial baked beans with sugar and/or pork added.  NUTS Avoid nuts.  Limit peanuts and walnuts to one tablespoonful per day.  BREADS, GRAINS Any baked goods with shortening and/or sugar. Commercial mixes with dried eggs and whole milk. Avoid sweet rolls, doughnuts, breakfast pastries (Danish), and sweetened packaged cereals (the added sugar converts readily to triglycerides).  MILK PRODUCTS Whole milk and whole-milk packaged goods; cream; ice cream; whole-milk puddings, yogurt, or cheeses; nondairy cream substitutes.  FATS, OILS Butter, lard, animal fats, bacon drippings, gravies, cream sauces as well as palm and coconut oils. All these are high in saturated fats. Examine labels on cholesterol free products for hydrogenated fats. (These are oils that have been hardened into solids and in the process have become saturated.)  DESSERTS, SNACKS Fried snack foods like potato chips; chocolate; candies in general; jams, jellies, syrups; whole- milk puddings; ice cream and milk sherbets; hydrogenated peanut butter.  BEVERAGES Sugared fruit juices and soft drinks; cocoa made with whole milk and/or sugar. When using alcohol (1 oz liquor, 5 oz beer, or 2  oz dry table wine per serving), one serving must be substituted for one bread or cereal serving (limit, two servings of alcohol per day).   SPECIAL NOTES    Remember that even non-limited foods should be used in moderation. While on a cholesterol-lowering diet, be sure to avoid animal fats and marbled meats. 3. While on a triglyceride-lowering diet, be sure to avoid sweets and to control the amount  of carbohydrates you eat (starchy foods such as flour, bread, potatoes).While on a tri-glyceride-lowering diet, be sure to avoid sweets Buy a good low-fat cookbook, such as the one published by the American Heart Association. Consult your physician if you have any questions.               [facility] Low Glycemic Diet Plan   Low Glycemic Foods (20-49) Moderate Glycemic Foods (50-69) High Glycemic Foods (70-100)      Breakfast Creals Breakfast Cereals Breakfast Cereals  All Bran All-Bran Fruit'n Oats   Bran Buds Bran Chex   Cheerios Corn chex    Fiber One Oatmeal (not instant)   Just Right Mini-Wheats   Corn Flakes Cream of Wheat    Oat Bran Special K Swiss Muesli   Grape Nuts Grape Nut Flakes      Grits Nutri-Grain    Fruits and fruit juice: Fruits Puffed Rice Puffed Wheat    (Limit   to 1-2 Servings per day) Banana (under-ride) Dates   Rice Chex Rice Krispies    Apples Apricots (fresh/dried)   Figs Grapes   Shredded Wheat Team    Blackberries Blueberries   Kiwi Mango   Total     Cherries Cranberries   Oranges Raisins     Peaches Pears    Fruits  Plums Prunes   Fruit Juices Pineapple Watermelon    Grapefruit Raspberries   Cranberry Juice Orange Juice   Banana (over-ripe)     Strawberries Tangerines      Apple Juice Grapefruit Juice   Beans and Legumes Beverages  Tomato Juice    [place]-type baked beans Sodas, sweet tea, pineapple juice   Canned pinto, kidney, or navy beans   Beans and Legumes (fresh-cooked) Green peas Vegetables  Black-eyed peas Butter Beans    Potato, baked, boiled, fried, mashed  Chick peas Lentils   Vegetables French fries  Green beans Lima beans   Beets Carrots   Canned or frozen corn  Kidney beans Navy beans   Sweet potato Yam   Parsnips  Pinto beans Snow peas   Corn on the cob Winter squash      Non-starchy vegetables Grains Breads  Asparagus, avocado, broccoli, cabbage Cornmeal Rice, brown   Most breads  (white and whole grain)  cauliflower, celery, cucumber, greens Rice, white Couscous   Bagels Bread sticks    lettuce, mushrooms, peppers, tomatoes  Bread stuffing Kaiser roll    okra, onions, spinach, summer squash Pasta Dinner rolls   Macaroni Pizza, cheese     Grains Ravioli, meat filled Spaghetti, white   Grains  Barley Bulgur    Rice, instant Tapioca, with milk    Rye Wild rice   Nuts    Cashews Macadamia   Candy and most cookies  Nuts and oils    Almonds, peanuts, sunflower seeds Snacks Snacks  hazelnuts, pecans, walnuts Chocolate Ice cream, lowfat   Donuts Corn chips    Oils that are liquid at room temperature Muffin Popcorn   Jelly beans Pretzels      Pastries  Dairy, fish, meat, soy, and eggs    Milk, skim Lowfat cheese    Restaurant and ethnic foods  Yogurt, lowfat, fruit sugar sweetened  Most Chinese food (sugar in stir fry    or wok sauce)  Lean red meat Fish    Teriyaki-style meats and vegetables  Skinless chicken and turkey, shellfish        Egg whites (up to 3 daily), Soy Products    Egg yolks (up to 7 or _____ per week)      

## 2022-08-03 ENCOUNTER — Ambulatory Visit: Payer: Medicare PPO | Attending: Cardiovascular Disease | Admitting: Cardiovascular Disease

## 2022-08-03 ENCOUNTER — Encounter: Payer: Self-pay | Admitting: Cardiovascular Disease

## 2022-08-03 VITALS — BP 111/85 | HR 75 | Ht 69.0 in | Wt 195.5 lb

## 2022-08-03 DIAGNOSIS — E782 Mixed hyperlipidemia: Secondary | ICD-10-CM | POA: Diagnosis not present

## 2022-08-03 DIAGNOSIS — I609 Nontraumatic subarachnoid hemorrhage, unspecified: Secondary | ICD-10-CM | POA: Diagnosis not present

## 2022-08-03 DIAGNOSIS — N1831 Chronic kidney disease, stage 3a: Secondary | ICD-10-CM

## 2022-08-03 DIAGNOSIS — R55 Syncope and collapse: Secondary | ICD-10-CM | POA: Diagnosis not present

## 2022-08-03 DIAGNOSIS — G250 Essential tremor: Secondary | ICD-10-CM | POA: Diagnosis not present

## 2022-08-03 DIAGNOSIS — I10 Essential (primary) hypertension: Secondary | ICD-10-CM

## 2022-08-03 LAB — RENAL FUNCTION PANEL
Albumin: 4.1 g/dL (ref 3.8–4.8)
BUN/Creatinine Ratio: 16 (ref 10–24)
BUN: 24 mg/dL (ref 8–27)
CO2: 28 mmol/L (ref 20–29)
Calcium: 10.9 mg/dL — ABNORMAL HIGH (ref 8.6–10.2)
Chloride: 104 mmol/L (ref 96–106)
Creatinine, Ser: 1.51 mg/dL — ABNORMAL HIGH (ref 0.76–1.27)
Glucose: 75 mg/dL (ref 70–99)
Phosphorus: 3.6 mg/dL (ref 2.8–4.1)
Potassium: 4.9 mmol/L (ref 3.5–5.2)
Sodium: 143 mmol/L (ref 134–144)
eGFR: 48 mL/min/{1.73_m2} — ABNORMAL LOW (ref >59)

## 2022-08-03 LAB — URINALYSIS, MICROSCOPIC ONLY
Bacteria, UA: NONE SEEN
Casts: NONE SEEN /lpf
Epithelial Cells (non renal): NONE SEEN /hpf (ref 0–10)
RBC, Urine: NONE SEEN /hpf (ref 0–2)
WBC, UA: NONE SEEN /hpf (ref 0–5)

## 2022-08-03 LAB — CBC WITH DIFFERENTIAL/PLATELET
Basophils Absolute: 0.1 10*3/uL (ref 0.0–0.2)
Basos: 1 %
EOS (ABSOLUTE): 0.2 10*3/uL (ref 0.0–0.4)
Eos: 2 %
Hematocrit: 51.3 % — ABNORMAL HIGH (ref 37.5–51.0)
Hemoglobin: 17 g/dL (ref 13.0–17.7)
Immature Grans (Abs): 0.1 10*3/uL (ref 0.0–0.1)
Immature Granulocytes: 1 %
Lymphocytes Absolute: 1.6 10*3/uL (ref 0.7–3.1)
Lymphs: 18 %
MCH: 32.4 pg (ref 26.6–33.0)
MCHC: 33.1 g/dL (ref 31.5–35.7)
MCV: 98 fL — ABNORMAL HIGH (ref 79–97)
Monocytes Absolute: 0.9 10*3/uL (ref 0.1–0.9)
Monocytes: 10 %
Neutrophils Absolute: 5.9 10*3/uL (ref 1.4–7.0)
Neutrophils: 68 %
Platelets: 165 10*3/uL (ref 150–450)
RBC: 5.25 x10E6/uL (ref 4.14–5.80)
RDW: 12.6 % (ref 11.6–15.4)
WBC: 8.7 10*3/uL (ref 3.4–10.8)

## 2022-08-03 NOTE — Patient Instructions (Signed)
Medication Instructions:  No changes  If you need a refill on your cardiac medications before your next appointment, please call your pharmacy.   Lab work: No new labs needed  Testing/Procedures: No new testing needed  Follow-Up: At [facility], you and your health needs are our priority.  As part of our continuing mission to provide you with exceptional heart care, we have created designated Provider Care Teams.  These Care Teams include your primary Cardiologist (physician) and Advanced Practice Providers (APPs -  Physician Assistants and Nurse Practitioners) who all work together to provide you with the care you need, when you need it.  You will need a follow up appointment as needed  Providers on your designated Care Team:   Christopher Berge, NP Ryan Dunn, PA-C Cadence Furth, PA-C  COVID-19 Vaccine Information can be found at: [email or web address] For questions related to vaccine distribution or appointments, please email [email or web address] or call [phone].    

## 2022-08-04 DIAGNOSIS — R29898 Other symptoms and signs involving the musculoskeletal system: Secondary | ICD-10-CM | POA: Diagnosis not present

## 2022-08-10 DIAGNOSIS — M1712 Unilateral primary osteoarthritis, left knee: Secondary | ICD-10-CM | POA: Diagnosis not present

## 2022-08-13 ENCOUNTER — Encounter: Payer: Self-pay | Admitting: Family Medicine

## 2022-08-13 ENCOUNTER — Ambulatory Visit (INDEPENDENT_AMBULATORY_CARE_PROVIDER_SITE_OTHER): Payer: Medicare PPO | Admitting: Family Medicine

## 2022-08-13 VITALS — BP 124/86 | HR 84 | Ht 69.0 in | Wt 197.0 lb

## 2022-08-13 DIAGNOSIS — I10 Essential (primary) hypertension: Secondary | ICD-10-CM

## 2022-08-13 NOTE — Progress Notes (Signed)
Date:  08/13/2022   Name:  Jason Krause   DOB:  16-Jan-1946   MRN:  [id number]   Chief Complaint: Hypertension  Hypertension This is a chronic problem. The current episode started more than 1 year ago. The problem has been gradually improving since onset. The problem is controlled. Pertinent negatives include no blurred vision, chest pain, headaches, neck pain, palpitations or shortness of breath. Past treatments include ACE inhibitors. The current treatment provides moderate improvement. There are no compliance problems.  There is no history of CAD/MI or CVA.    Lab Results  Component Value Date   NA 143 08/02/2022   K 4.9 08/02/2022   CO2 28 08/02/2022   GLUCOSE 75 08/02/2022   BUN 24 08/02/2022   CREATININE 1.51 (H) 08/02/2022   CALCIUM 10.9 (H) 08/02/2022   EGFR 48 (L) 08/02/2022   GFRNONAA 59 (L) 07/28/2022   Lab Results  Component Value Date   CHOL 157 06/04/2022   HDL 48 06/04/2022   LDLCALC 97 06/04/2022   TRIG 58 06/04/2022   CHOLHDL 3.6 11/06/2019   Lab Results  Component Value Date   TSH 1.344 07/26/2022   Lab Results  Component Value Date   HGBA1C 5.8 (H) 07/27/2022   Lab Results  Component Value Date   WBC 8.7 08/02/2022   HGB 17.0 08/02/2022   HCT 51.3 (H) 08/02/2022   MCV 98 (H) 08/02/2022   PLT 165 08/02/2022   Lab Results  Component Value Date   ALT 17 07/27/2022   AST 24 07/27/2022   ALKPHOS 75 07/27/2022   BILITOT 1.2 07/27/2022   No results found for: "25OHVITD2", "25OHVITD3", "VD25OH"   Review of Systems  Constitutional:  Negative for chills and fever.  HENT:  Negative for drooling, ear discharge, ear pain and sore throat.   Eyes:  Negative for blurred vision.  Respiratory:  Negative for cough, shortness of breath and wheezing.   Cardiovascular:  Negative for chest pain, palpitations and leg swelling.  Gastrointestinal:  Negative for abdominal pain, blood in stool, constipation, diarrhea and nausea.  Endocrine: Negative for  polydipsia.  Genitourinary:  Negative for dysuria, frequency, hematuria and urgency.  Musculoskeletal:  Negative for back pain, myalgias and neck pain.  Skin:  Negative for rash.  Allergic/Immunologic: Negative for environmental allergies.  Neurological:  Negative for dizziness and headaches.  Hematological:  Does not bruise/bleed easily.  Psychiatric/Behavioral:  Negative for suicidal ideas. The patient is not nervous/anxious.     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   Pain in both knees 10/29/2020   Essential tremor 10/29/2020   Leg weakness, bilateral 09/02/2020   Leg pain 06/22/2020   Personal history of colonic polyps    Polyp of descending colon    CKD (chronic kidney disease) stage 3, GFR 30-59 ml/min (HCC) 12/28/2017   Osteopenia 12/28/2017   Polyp of gallbladder 12/28/2017   Right bundle branch block 12/28/2017   Chronic kidney disease, stage 3 (HCC) 12/28/2017   SAH (subarachnoid hemorrhage) (HCC) 11/11/2016   Essential hypertension 11/09/2016   Mixed hyperlipidemia 11/08/2016   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   Personal history of renal cell carcinoma 11/08/2016   Cerebrovascular accident (HCC) 10/18/2016   History of kidney cancer 01/19/2007  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HCC) 01/12/2001    Allergies  Allergen Reactions   Carvedilol Other (See Comments)    "makes me a zombie" Other reaction(s): Abdominal Pain, Hallucination Lethargic    Simvastatin     Other reaction(s): Muscle Pain    Past Surgical History:  Procedure Laterality Date   COLONOSCOPY WITH PROPOFOL N/A 12/06/2019   Procedure: COLONOSCOPY and biopsy WITH PROPOFOL;  Surgeon: Midge Minium, MD;  Location: [facility];   Service: Endoscopy;  Laterality: N/A;  priority 4   HERNIA REPAIR     kidney removal due to cancer Right    POLYPECTOMY N/A 12/06/2019   Procedure: POLYPECTOMY;  Surgeon: Midge Minium, MD;  Location: [facility];  Service: Endoscopy;  Laterality: N/A;   PROSTATE SURGERY     removed    Social History   Tobacco Use   Smoking status: Never    Passive exposure: Never   Smokeless tobacco: Never  Vaping Use   Vaping Use: Never used  Substance Use Topics   Alcohol use: Yes    Comment: social   Drug use: Never     Medication list has been reviewed and updated.  Current Meds  Medication Sig   folic acid (FOLVITE) 400 MCG tablet Take 1 tablet by mouth daily.   Glucos-Chondroit-Hyaluron-MSM (GLUCOSAMINE CHONDROITIN JOINT PO) Take 2 tablets by mouth daily.   lisinopril (ZESTRIL) 20 MG tablet Take 1 tablet (20 mg total) by mouth daily.   Multiple Vitamins-Minerals (CENTRUM SILVER PO) Take 1 tablet by mouth daily.   rosuvastatin (CRESTOR) 10 MG tablet Take 1 tablet (10 mg total) by mouth daily.       08/13/2022   10:29 AM 08/02/2022    1:27 PM 07/07/2022   11:20 AM 06/04/2022    9:32 AM  GAD 7 : Generalized Anxiety Score  Nervous, Anxious, on Edge 0 0 0 0  Control/stop worrying 0 0 0 0  Worry too much - different things 0 0 0 0  Trouble relaxing 0 0 0 0  Restless 0 0 0 0  Easily annoyed or irritable 0 0 0 0  Afraid - awful might happen 0 0 0 0  Total GAD 7 Score 0 0 0 0  Anxiety Difficulty Not difficult at all Not difficult at all Not difficult at all Not difficult at all       08/13/2022   10:28 AM 08/02/2022    1:26 PM 07/07/2022   11:20 AM  Depression screen PHQ 2/9  Decreased Interest 0 0 0  Down, Depressed, Hopeless 0 0 0  PHQ - 2 Score 0 0 0  Altered sleeping 0 0 0  Tired, decreased energy 0 0 0  Change in appetite 0 0 0  Feeling bad or failure about yourself  0 0 0  Trouble concentrating 0 0 0  Moving slowly or fidgety/restless 0 0 0  Suicidal thoughts 0 0  0  PHQ-9 Score 0 0 0  Difficult doing work/chores Not difficult at all Not difficult at all Not difficult at all    BP Readings from Last 3 Encounters:  08/13/22 (!) 130/94  08/03/22 111/85  08/02/22 100/64    Physical Exam Vitals and nursing note reviewed.  HENT:     Head: Normocephalic.     Right Ear: Tympanic membrane normal.     Left Ear: Tympanic membrane normal.     Nose: No congestion or rhinorrhea.     Mouth/Throat:     Mouth: Mucous membranes are  moist.  Eyes:     General: No scleral icterus.       Right eye: No discharge.        Left eye: No discharge.     Conjunctiva/sclera: Conjunctivae normal.     Pupils: Pupils are equal, round, and reactive to light.  Neck:     Thyroid: No thyromegaly.     Vascular: No carotid bruit or JVD.     Trachea: No tracheal deviation.  Cardiovascular:     Rate and Rhythm: Normal rate and regular rhythm.     Heart sounds: Normal heart sounds. No murmur heard.    No friction rub. No gallop.  Pulmonary:     Effort: No respiratory distress.     Breath sounds: Normal breath sounds. No stridor. No wheezing, rhonchi or rales.  Chest:     Chest wall: No tenderness.  Abdominal:     Palpations: Abdomen is soft.     Tenderness: There is no guarding or rebound.  Musculoskeletal:        General: No tenderness.     Cervical back: Normal range of motion and neck supple.  Lymphadenopathy:     Cervical: No cervical adenopathy.  Skin:    General: Skin is warm.     Findings: No rash.  Neurological:     Mental Status: He is alert.     Wt Readings from Last 3 Encounters:  08/13/22 197 lb (89.4 kg)  08/03/22 195 lb 8 oz (88.7 kg)  08/02/22 195 lb (88.5 kg)    BP (!) 130/94 (BP Location: Left Arm, Cuff Size: Large)   Pulse 88   Ht 5\' 9"  (1.753 m)   Wt 197 lb (89.4 kg)   SpO2 97%   BMI 29.09 kg/m   Assessment and Plan:  1. Essential hypertension Chronic.  Controlled.  Stable.  Blood pressure today is 124/86 pulse rate is 84.   Asymptomatic.  Tolerating medication change well.  Last visit propranolol and amlodipine was discontinued and we are rechecking on just the lisinopril 20 mg.  Blood pressure is acceptable and we will continue at present dosing with a low threshold to add amlodipine.  Elizabeth Sauer, MD

## 2022-08-17 ENCOUNTER — Ambulatory Visit: Payer: Medicare PPO | Admitting: Urology

## 2022-08-17 ENCOUNTER — Encounter: Payer: Self-pay | Admitting: Urology

## 2022-08-17 ENCOUNTER — Other Ambulatory Visit: Payer: Self-pay | Admitting: *Deleted

## 2022-08-17 ENCOUNTER — Other Ambulatory Visit
Admission: RE | Admit: 2022-08-17 | Discharge: 2022-08-17 | Disposition: A | Discharge status: Home / Self Care | Payer: Medicare PPO | Source: Ambulatory Visit | Attending: Urology | Admitting: Urology

## 2022-08-17 VITALS — BP 135/93 | HR 97 | Ht 69.0 in | Wt 198.0 lb

## 2022-08-17 DIAGNOSIS — R399 Unspecified symptoms and signs involving the genitourinary system: Secondary | ICD-10-CM | POA: Insufficient documentation

## 2022-08-17 DIAGNOSIS — N3281 Overactive bladder: Secondary | ICD-10-CM | POA: Diagnosis not present

## 2022-08-17 DIAGNOSIS — N1339 Other hydronephrosis: Secondary | ICD-10-CM

## 2022-08-17 LAB — URINALYSIS, COMPLETE (UACMP) WITH MICROSCOPIC
Bilirubin Urine: NEGATIVE
Glucose, UA: NEGATIVE mg/dL
Hgb urine dipstick: NEGATIVE
Ketones, ur: NEGATIVE mg/dL
Leukocytes,Ua: NEGATIVE
Nitrite: NEGATIVE
Protein, ur: NEGATIVE mg/dL
Specific Gravity, Urine: 1.01 (ref 1.005–1.030)
Squamous Epithelial / HPF: NONE SEEN /HPF (ref 0–5)
pH: 5.5 (ref 5.0–8.0)

## 2022-08-17 LAB — BLADDER SCAN AMB NON-IMAGING

## 2022-08-17 MED ORDER — MIRABEGRON ER 50 MG PO TB24
50.0000 mg | ORAL_TABLET | Freq: Every day | ORAL | 3 refills | Status: DC
Start: 2022-08-17 — End: 2022-12-06

## 2022-08-17 NOTE — Progress Notes (Signed)
   08/17/2022 11:42 AM   Jason Krause February 28, 1946 [id number]  Reason for visit: Follow up history of prostate cancer, kidney cancer, urinary symptoms, CKD, new left hydronephrosis  HPI: 77 year old healthy male with a history of an open radical prostatectomy about 20 years ago, with undetectable PSA since that time, as well as an open right radical nephrectomy approximately 10 years ago for kidney cancer.  Those records are unavailable to me(Dr. Seward Grater at [facility] urological).  He has opted to discontinue PSA surveillance now that he has over 20 years out from his prostate cancer which is very reasonable per the AUA guidelines.  Regarding his history of RCC, we reviewed the AUA guidelines discussing surveillance, and he opts to continue a yearly renal ultrasound.    In 2022 he had reported some spraying and split urinary stream and underwent a cystoscopy in August 2022 that was completely benign.  Recently hospitalized in April 2024 for acute onset of dizziness and was briefly admitted to the hospital.  Renal function at that time was at baseline with creatinine of 1.2, however CT showed some mild left hydronephrosis down to a distended bladder.  He denies any left-sided flank pain, no gross hematuria.  I personally viewed and interpreted those CT images, and agree with those findings, may have been related to aggressive hydration at time of hospitalization versus full bladder with reflux.  In the setting of no left-sided flank pain and renal function at baseline, I do not think he requires further aggressive workup with ureteroscopy at this time.    I recommended a repeat renal ultrasound in June 2024 to confirm resolution of previously seen left-sided hydronephrosis, and will call with those results.  He also has overactive symptoms that previously was responsive to Myrbetriq samples, but he never ended up getting a prescription for this and is interested in trying this long-term  Call with  renal ultrasound results from June 2024 Myrbetriq sent in, okay to trial Gemtesa if Myrbetriq not covered  Sondra Come, MD  [facility] [address] [place], [place] [address] [phone]

## 2022-08-18 HISTORY — PX: TOTAL KNEE ARTHROPLASTY: SHX125

## 2022-08-19 ENCOUNTER — Telehealth: Payer: Self-pay

## 2022-08-19 NOTE — Telephone Encounter (Signed)
Prior auth for Myrbetriq submitted via cover my meds. Awaiting response. (Key: [id number])

## 2022-08-20 NOTE — Telephone Encounter (Signed)
PA for Myrbetriq 50mg  approved.  Dates: 08/20/22 - 04/19/23.

## 2022-08-23 NOTE — Telephone Encounter (Signed)
Patient called and I advised that PA has been approved.  Pt also stated that his ultrasound was supposed to be done ASAP? Please advise, I would need to change the date on the order.

## 2022-08-24 NOTE — Telephone Encounter (Signed)
Spoke with patient and advised results  I will change the dater on the order from August to June.

## 2022-08-27 DIAGNOSIS — Z01818 Encounter for other preprocedural examination: Secondary | ICD-10-CM | POA: Diagnosis not present

## 2022-08-27 DIAGNOSIS — M25562 Pain in left knee: Secondary | ICD-10-CM | POA: Diagnosis not present

## 2022-08-27 DIAGNOSIS — M1712 Unilateral primary osteoarthritis, left knee: Secondary | ICD-10-CM | POA: Diagnosis not present

## 2022-09-06 DIAGNOSIS — Z01812 Encounter for preprocedural laboratory examination: Secondary | ICD-10-CM | POA: Diagnosis not present

## 2022-09-06 DIAGNOSIS — M1712 Unilateral primary osteoarthritis, left knee: Secondary | ICD-10-CM | POA: Diagnosis not present

## 2022-09-09 DIAGNOSIS — I10 Essential (primary) hypertension: Secondary | ICD-10-CM | POA: Diagnosis not present

## 2022-09-09 DIAGNOSIS — I129 Hypertensive chronic kidney disease with stage 1 through stage 4 chronic kidney disease, or unspecified chronic kidney disease: Secondary | ICD-10-CM | POA: Diagnosis not present

## 2022-09-09 DIAGNOSIS — E785 Hyperlipidemia, unspecified: Secondary | ICD-10-CM | POA: Diagnosis not present

## 2022-09-09 DIAGNOSIS — Z79899 Other long term (current) drug therapy: Secondary | ICD-10-CM | POA: Diagnosis not present

## 2022-09-09 DIAGNOSIS — M1712 Unilateral primary osteoarthritis, left knee: Secondary | ICD-10-CM | POA: Diagnosis not present

## 2022-09-09 DIAGNOSIS — Z8249 Family history of ischemic heart disease and other diseases of the circulatory system: Secondary | ICD-10-CM | POA: Diagnosis not present

## 2022-09-09 DIAGNOSIS — N183 Chronic kidney disease, stage 3 unspecified: Secondary | ICD-10-CM | POA: Diagnosis not present

## 2022-09-09 DIAGNOSIS — I609 Nontraumatic subarachnoid hemorrhage, unspecified: Secondary | ICD-10-CM | POA: Diagnosis not present

## 2022-09-09 DIAGNOSIS — Z888 Allergy status to other drugs, medicaments and biological substances status: Secondary | ICD-10-CM | POA: Diagnosis not present

## 2022-09-09 DIAGNOSIS — K469 Unspecified abdominal hernia without obstruction or gangrene: Secondary | ICD-10-CM | POA: Diagnosis not present

## 2022-09-09 DIAGNOSIS — G8918 Other acute postprocedural pain: Secondary | ICD-10-CM | POA: Diagnosis not present

## 2022-09-10 DIAGNOSIS — K469 Unspecified abdominal hernia without obstruction or gangrene: Secondary | ICD-10-CM | POA: Diagnosis not present

## 2022-09-10 DIAGNOSIS — I129 Hypertensive chronic kidney disease with stage 1 through stage 4 chronic kidney disease, or unspecified chronic kidney disease: Secondary | ICD-10-CM | POA: Diagnosis not present

## 2022-09-10 DIAGNOSIS — M1712 Unilateral primary osteoarthritis, left knee: Secondary | ICD-10-CM | POA: Diagnosis not present

## 2022-09-10 DIAGNOSIS — Z888 Allergy status to other drugs, medicaments and biological substances status: Secondary | ICD-10-CM | POA: Diagnosis not present

## 2022-09-10 DIAGNOSIS — Z8249 Family history of ischemic heart disease and other diseases of the circulatory system: Secondary | ICD-10-CM | POA: Diagnosis not present

## 2022-09-10 DIAGNOSIS — I609 Nontraumatic subarachnoid hemorrhage, unspecified: Secondary | ICD-10-CM | POA: Diagnosis not present

## 2022-09-10 DIAGNOSIS — Z79899 Other long term (current) drug therapy: Secondary | ICD-10-CM | POA: Diagnosis not present

## 2022-09-10 DIAGNOSIS — E785 Hyperlipidemia, unspecified: Secondary | ICD-10-CM | POA: Diagnosis not present

## 2022-09-10 DIAGNOSIS — N183 Chronic kidney disease, stage 3 unspecified: Secondary | ICD-10-CM | POA: Diagnosis not present

## 2022-09-14 DIAGNOSIS — M25562 Pain in left knee: Secondary | ICD-10-CM | POA: Diagnosis not present

## 2022-09-14 DIAGNOSIS — Z96652 Presence of left artificial knee joint: Secondary | ICD-10-CM | POA: Diagnosis not present

## 2022-09-17 DIAGNOSIS — M25562 Pain in left knee: Secondary | ICD-10-CM | POA: Diagnosis not present

## 2022-09-17 DIAGNOSIS — Z96652 Presence of left artificial knee joint: Secondary | ICD-10-CM | POA: Diagnosis not present

## 2022-09-21 DIAGNOSIS — M25562 Pain in left knee: Secondary | ICD-10-CM | POA: Diagnosis not present

## 2022-09-21 DIAGNOSIS — Z96652 Presence of left artificial knee joint: Secondary | ICD-10-CM | POA: Diagnosis not present

## 2022-09-24 DIAGNOSIS — M25562 Pain in left knee: Secondary | ICD-10-CM | POA: Diagnosis not present

## 2022-09-24 DIAGNOSIS — Z96652 Presence of left artificial knee joint: Secondary | ICD-10-CM | POA: Diagnosis not present

## 2022-09-28 DIAGNOSIS — Z96652 Presence of left artificial knee joint: Secondary | ICD-10-CM | POA: Diagnosis not present

## 2022-09-28 DIAGNOSIS — M25562 Pain in left knee: Secondary | ICD-10-CM | POA: Diagnosis not present

## 2022-09-29 DIAGNOSIS — Z85820 Personal history of malignant melanoma of skin: Secondary | ICD-10-CM | POA: Diagnosis not present

## 2022-09-29 DIAGNOSIS — Z872 Personal history of diseases of the skin and subcutaneous tissue: Secondary | ICD-10-CM | POA: Diagnosis not present

## 2022-09-29 DIAGNOSIS — L578 Other skin changes due to chronic exposure to nonionizing radiation: Secondary | ICD-10-CM | POA: Diagnosis not present

## 2022-09-29 DIAGNOSIS — D485 Neoplasm of uncertain behavior of skin: Secondary | ICD-10-CM | POA: Diagnosis not present

## 2022-09-29 DIAGNOSIS — Z85828 Personal history of other malignant neoplasm of skin: Secondary | ICD-10-CM | POA: Diagnosis not present

## 2022-09-29 DIAGNOSIS — L570 Actinic keratosis: Secondary | ICD-10-CM | POA: Diagnosis not present

## 2022-09-30 DIAGNOSIS — G4733 Obstructive sleep apnea (adult) (pediatric): Secondary | ICD-10-CM | POA: Diagnosis not present

## 2022-10-01 DIAGNOSIS — Z96652 Presence of left artificial knee joint: Secondary | ICD-10-CM | POA: Diagnosis not present

## 2022-10-01 DIAGNOSIS — M25562 Pain in left knee: Secondary | ICD-10-CM | POA: Diagnosis not present

## 2022-10-07 DIAGNOSIS — Z96652 Presence of left artificial knee joint: Secondary | ICD-10-CM | POA: Diagnosis not present

## 2022-10-07 DIAGNOSIS — M25562 Pain in left knee: Secondary | ICD-10-CM | POA: Diagnosis not present

## 2022-10-11 ENCOUNTER — Ambulatory Visit: Payer: Self-pay

## 2022-10-11 NOTE — Telephone Encounter (Signed)
  Chief Complaint: High BP readings Symptoms: none Frequency: yesterday and today Pertinent Negatives: Patient denies any s/s Disposition: [] ED /[] Urgent Care (no appt availability in office) / [x] Appointment(In office/virtual)/ []  [facility]/ [] Home Care/ [] Refused Recommended Disposition /[] [facility]/ []  Follow-up with PCP Additional Notes: Pt states that yesterday BP was 142/95. Today before taking his medication is was 150/100. Pt took medication 10 minutes ago. Pt is having no SOB, HA, weakness. Pt will monitor BP and call back should readings increase, or should he develop troubling s/s.   Summary: High bp   The patient called in stating his bp has been running high, last reading was 142/95 even on his medication, lisinopril. He is worried because he only has 1 kidney. Please assist patient further.     Reason for Disposition  Systolic BP  >= 160 OR Diastolic >= 100  Answer Assessment - Initial Assessment Questions 1. BLOOD PRESSURE: "What is the blood pressure?" "Did you take at least two measurements 5 minutes apart?"     142/95 - yesterday afternoon, 150/100 this morning 2. ONSET: "When did you take your blood pressure?"     Yesterday and today. 3. HOW: "How did you take your blood pressure?" (e.g., automatic home BP monitor, visiting nurse)     Automatic home  4. HISTORY: "Do you have a history of high blood pressure?"     yes 5. MEDICINES: "Are you taking any medicines for blood pressure?" "Have you missed any doses recently?"     yes 6. OTHER SYMPTOMS: "Do you have any symptoms?" (e.g., blurred vision, chest pain, difficulty breathing, headache, weakness)     no  Protocols used: Blood Pressure - High-A-AH

## 2022-10-12 ENCOUNTER — Other Ambulatory Visit: Payer: Self-pay | Admitting: Family Medicine

## 2022-10-12 ENCOUNTER — Telehealth: Payer: Self-pay

## 2022-10-12 ENCOUNTER — Encounter: Payer: Self-pay | Admitting: Family Medicine

## 2022-10-12 ENCOUNTER — Ambulatory Visit: Payer: Medicare PPO | Admitting: Family Medicine

## 2022-10-12 VITALS — BP 140/100 | HR 100 | Ht 69.0 in | Wt 193.0 lb

## 2022-10-12 DIAGNOSIS — I10 Essential (primary) hypertension: Secondary | ICD-10-CM | POA: Diagnosis not present

## 2022-10-12 MED ORDER — VALSARTAN 160 MG PO TABS
160.0000 mg | ORAL_TABLET | Freq: Every day | ORAL | 3 refills | Status: DC
Start: 2022-10-12 — End: 2022-12-06

## 2022-10-12 NOTE — Patient Instructions (Signed)
Pyogenic Granuloma Pyogenic granuloma is a growth (lesion) that forms on the skin or on the mucous membranes of the mouth. This type of lesion is a lump of very red tissue that bleeds easily, is usually a single lesion, and most often affects: The head and neck. The mucous membranes of the mouth or tongue. The upper body. The hands and feet. A pyogenic granuloma usually measures about 0.2 inches (0.5 cm), but lesions can be smaller or larger. This condition does not spread from person to person (is not contagious). The lesion is noncancerous (benign). What are the causes? The cause of pyogenic granulomas is unknown, but the lesions commonly occur after a minor injury, such as pricking your skin or biting your lip or tongue. A mound of tiny blood vessels (capillaries) forms to create a lesion. Sometimes the lesions occur without an injury. What increases the risk? You are more likely to develop this condition if: You are pregnant. You are a child or young adult. You take certain medicines, including: Medicines for acne. Birth control pills. Some medicines used to treat cancer, HIV, or AIDS. What are the signs or symptoms? The main symptom of this condition is a raised or lumpy lesion that is very red. Your lesion may also: Have a crusty, broken, and irritated (ulcerated) surface. Bleed easily. Be slightly sore. How is this diagnosed? This condition is diagnosed based on your symptoms and medical history, especially if you recently had an injury. You may also have: A physical exam. A small piece of your granuloma removed for testing (biopsy) to rule out cancer. How is this treated? A small lesion may go away without treatment. You may have to stop or change any medicines that caused your lesion. Pyogenic granulomas caused by pregnancy usually go away after delivery. This condition may also be treated by removing the lesion. This may be done if the lesion is large, irritated, or bleeds  easily. Removal may involve: Curettage. This scrapes away the lesion. Using chemicals or electric energy to destroy the lesion. Surgical excision. This removes the lesion along with a small piece of normal skin or mucous membrane. This is the best treatment to prevent the lesion from coming back. Follow these instructions at home: Take over-the-counter and prescription medicines only as told by your health care provider. Do not scratch or pick at your lesion. Cover your lesion area with a bandage (dressing) or gauze to avoid having anything rub against your lesion. Keep your lesion clean to avoid infection. Keep all follow-up visits. This is important. Contact a health care provider if: You have a fever. Your lesion bleeds. Your lesion comes back after treatment. You have a lesion that grows rapidly or hardens. Summary Pyogenic granuloma is a growth (lesion) that forms on the skin or on the mucous membranes of the mouth. This condition does not spread from person to person (is not contagious). A small lesion may go away without treatment. If your lesion is large, irritated, or bleeds easily, you may need to have it removed. Do not scratch or pick at your lesion. Cover your lesion area with a bandage (dressing) or gauze to avoid having anything rub against your lesion. Keep your lesion clean to avoid infection. This information is not intended to replace advice given to you by your health care provider. Make sure you discuss any questions you have with your health care provider. Document Revised: 07/31/2020 Document Reviewed: 07/31/2020 Elsevier Patient Education  2024 [employer].

## 2022-10-12 NOTE — Telephone Encounter (Signed)
Called [facility]- waiting on call back

## 2022-10-12 NOTE — Progress Notes (Signed)
Date:  10/12/2022   Name:  Jason Krause   DOB:  June 13, 1945   MRN:  [id number]   Chief Complaint: Hypertension (Having high readings at home- taking at different times of the day)  Hypertension This is a chronic problem. The current episode started more than 1 year ago. The problem has been gradually improving since onset. The problem is controlled. Pertinent negatives include no blurred vision, chest pain, headaches, orthopnea, palpitations, PND or shortness of breath. There are no associated agents to hypertension. Risk factors for coronary artery disease include dyslipidemia. Past treatments include ACE inhibitors. The current treatment provides moderate improvement. There are no compliance problems.  There is no history of angina, kidney disease, CAD/MI, CVA or left ventricular hypertrophy. There is no history of chronic renal disease, a hypertension causing med or renovascular disease.    Lab Results  Component Value Date   NA 143 08/02/2022   K 4.9 08/02/2022   CO2 28 08/02/2022   GLUCOSE 75 08/02/2022   BUN 24 08/02/2022   CREATININE 1.51 (H) 08/02/2022   CALCIUM 10.9 (H) 08/02/2022   EGFR 48 (L) 08/02/2022   GFRNONAA 59 (L) 07/28/2022   Lab Results  Component Value Date   CHOL 157 06/04/2022   HDL 48 06/04/2022   LDLCALC 97 06/04/2022   TRIG 58 06/04/2022   CHOLHDL 3.6 11/06/2019   Lab Results  Component Value Date   TSH 1.344 07/26/2022   Lab Results  Component Value Date   HGBA1C 5.8 (H) 07/27/2022   Lab Results  Component Value Date   WBC 8.7 08/02/2022   HGB 17.0 08/02/2022   HCT 51.3 (H) 08/02/2022   MCV 98 (H) 08/02/2022   PLT 165 08/02/2022   Lab Results  Component Value Date   ALT 17 07/27/2022   AST 24 07/27/2022   ALKPHOS 75 07/27/2022   BILITOT 1.2 07/27/2022   No results found for: "25OHVITD2", "25OHVITD3", "VD25OH"   Review of Systems  Constitutional:  Negative for fatigue.  HENT:  Negative for congestion, rhinorrhea and sinus pressure.    Eyes:  Negative for blurred vision.  Respiratory:  Negative for cough, shortness of breath and wheezing.   Cardiovascular:  Negative for chest pain, palpitations, orthopnea, leg swelling and PND.  Gastrointestinal:  Negative for abdominal pain.  Endocrine: Negative for polydipsia and polyuria.  Neurological:  Negative for headaches.  Hematological:  Negative for adenopathy. Does not bruise/bleed easily.    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   Pain in both knees 10/29/2020   Essential tremor 10/29/2020   Leg weakness, bilateral 09/02/2020   Leg pain 06/22/2020   Personal history of colonic polyps    Polyp of descending colon    CKD (chronic kidney disease) stage 3, GFR 30-59 ml/min (HCC) 12/28/2017   Osteopenia 12/28/2017   Polyp of gallbladder 12/28/2017   Right bundle branch block 12/28/2017   Chronic kidney disease, stage 3 (HCC) 12/28/2017   SAH (subarachnoid hemorrhage) (HCC) 11/11/2016   Essential hypertension 11/09/2016   Mixed hyperlipidemia 11/08/2016   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   Personal history of renal cell carcinoma 11/08/2016   Cerebrovascular accident (HCC) 10/18/2016   History of kidney cancer 01/19/2007   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HCC)  01/12/2001    Allergies  Allergen Reactions   Carvedilol Other (See Comments)    "makes me a zombie" Other reaction(s): Abdominal Pain, Hallucination Lethargic    Simvastatin     Other reaction(s): Muscle Pain    Past Surgical History:  Procedure Laterality Date   COLONOSCOPY WITH PROPOFOL N/A 12/06/2019   Procedure: COLONOSCOPY and biopsy WITH PROPOFOL;  Surgeon: Midge Minium, MD;  Location: [facility];  Service: Endoscopy;   Laterality: N/A;  priority 4   HERNIA REPAIR     kidney removal due to cancer Right    POLYPECTOMY N/A 12/06/2019   Procedure: POLYPECTOMY;  Surgeon: Midge Minium, MD;  Location: [facility];  Service: Endoscopy;  Laterality: N/A;   PROSTATE SURGERY     removed    Social History   Tobacco Use   Smoking status: Never    Passive exposure: Never   Smokeless tobacco: Never  Vaping Use   Vaping Use: Never used  Substance Use Topics   Alcohol use: Yes    Comment: social   Drug use: Never     Medication list has been reviewed and updated.  Current Meds  Medication Sig   folic acid (FOLVITE) 400 MCG tablet Take 1 tablet by mouth daily.   Glucos-Chondroit-Hyaluron-MSM (GLUCOSAMINE CHONDROITIN JOINT PO) Take 2 tablets by mouth daily.   lisinopril (ZESTRIL) 20 MG tablet Take 1 tablet (20 mg total) by mouth daily. (Patient taking differently: Take 30 mg by mouth daily.)   mirabegron ER (MYRBETRIQ) 50 MG TB24 tablet Take 1 tablet (50 mg total) by mouth daily.   Multiple Vitamins-Minerals (CENTRUM SILVER PO) Take 1 tablet by mouth daily.   rosuvastatin (CRESTOR) 10 MG tablet Take 1 tablet (10 mg total) by mouth daily.       10/12/2022   10:19 AM 08/13/2022   10:29 AM 08/02/2022    1:27 PM 07/07/2022   11:20 AM  GAD 7 : Generalized Anxiety Score  Nervous, Anxious, on Edge 0 0 0 0  Control/stop worrying 0 0 0 0  Worry too much - different things 0 0 0 0  Trouble relaxing 0 0 0 0  Restless 0 0 0 0  Easily annoyed or irritable 0 0 0 0  Afraid - awful might happen 0 0 0 0  Total GAD 7 Score 0 0 0 0  Anxiety Difficulty Not difficult at all Not difficult at all Not difficult at all Not difficult at all       10/12/2022   10:19 AM 08/13/2022   10:28 AM 08/02/2022    1:26 PM  Depression screen PHQ 2/9  Decreased Interest 0 0 0  Down, Depressed, Hopeless 0 0 0  PHQ - 2 Score 0 0 0  Altered sleeping 0 0 0  Tired, decreased energy 0 0 0  Change in appetite 0 0 0  Feeling bad  or failure about yourself  0 0 0  Trouble concentrating 0 0 0  Moving slowly or fidgety/restless 0 0 0  Suicidal thoughts 0 0 0  PHQ-9 Score 0 0 0  Difficult doing work/chores Not difficult at all Not difficult at all Not difficult at all    BP Readings from Last 3 Encounters:  10/12/22 (!) 140/100  08/17/22 (!) 135/93  08/13/22 124/86    Physical Exam Vitals and nursing note reviewed.  HENT:     Head: Normocephalic.     Right Ear: Tympanic membrane and external ear normal. There is no impacted cerumen.  Left Ear: Tympanic membrane and external ear normal. There is no impacted cerumen.     Nose: Nose normal. No congestion or rhinorrhea.     Mouth/Throat:     Mouth: Mucous membranes are moist.     Pharynx: No oropharyngeal exudate.  Eyes:     General: No scleral icterus.       Right eye: No discharge.        Left eye: No discharge.     Conjunctiva/sclera: Conjunctivae normal.     Pupils: Pupils are equal, round, and reactive to light.  Neck:     Thyroid: No thyromegaly.     Vascular: No JVD.     Trachea: No tracheal deviation.  Cardiovascular:     Rate and Rhythm: Normal rate and regular rhythm.     Heart sounds: Normal heart sounds. No murmur heard.    No friction rub. No gallop.  Pulmonary:     Effort: No respiratory distress.     Breath sounds: Normal breath sounds. No wheezing or rales.  Abdominal:     General: Bowel sounds are normal.     Palpations: Abdomen is soft. There is no mass.     Tenderness: There is no abdominal tenderness. There is no guarding or rebound.  Musculoskeletal:        General: No tenderness.     Cervical back: Normal range of motion and neck supple.  Lymphadenopathy:     Cervical: No cervical adenopathy.  Skin:    General: Skin is warm.     Findings: No rash.  Neurological:     Mental Status: He is alert.     Cranial Nerves: No cranial nerve deficit.     Wt Readings from Last 3 Encounters:  10/12/22 193 lb (87.5 kg)  08/17/22  198 lb (89.8 kg)  08/13/22 197 lb (89.4 kg)    BP (!) 140/100 (BP Location: Right Arm, Cuff Size: Normal)   Pulse 100   Ht 5\' 9"  (1.753 m)   Wt 193 lb (87.5 kg)   SpO2 95%   BMI 28.50 kg/m   Assessment and Plan:  1. Essential hypertension Chronic.  Controlled.  Stable.  Blood pressure 140/100.  Asymptomatic.  Tolerating medication which is currently on lisinopril 30 mg.  To improve half-life as well as options for increasing-switched from lisinopril to valsartan 160 mg once a day and patient is to go to nephrology Dr. Jeannine Kitten for recheck with options either to increase to 320 mg and/or add hydrochlorothiazide.  Patient had nephrectomy for what sounds like renal cell carcinoma and is currently with 1 functioning kidney. - Renal Function Panel - valsartan (DIOVAN) 160 MG tablet; Take 1 tablet (160 mg total) by mouth daily.  Dispense: 90 tablet; Refill: 3    Elizabeth Sauer, MD

## 2022-10-13 DIAGNOSIS — C44729 Squamous cell carcinoma of skin of left lower limb, including hip: Secondary | ICD-10-CM | POA: Diagnosis not present

## 2022-10-13 DIAGNOSIS — D485 Neoplasm of uncertain behavior of skin: Secondary | ICD-10-CM | POA: Diagnosis not present

## 2022-10-13 LAB — RENAL FUNCTION PANEL
Albumin: 4.2 g/dL (ref 3.8–4.8)
BUN/Creatinine Ratio: 9 — ABNORMAL LOW (ref 10–24)
BUN: 12 mg/dL (ref 8–27)
CO2: 25 mmol/L (ref 20–29)
Calcium: 10.8 mg/dL — ABNORMAL HIGH (ref 8.6–10.2)
Chloride: 106 mmol/L (ref 96–106)
Creatinine, Ser: 1.31 mg/dL — ABNORMAL HIGH (ref 0.76–1.27)
Glucose: 88 mg/dL (ref 70–99)
Phosphorus: 3.5 mg/dL (ref 2.8–4.1)
Potassium: 5.4 mmol/L — ABNORMAL HIGH (ref 3.5–5.2)
Sodium: 144 mmol/L (ref 134–144)
eGFR: 57 mL/min/{1.73_m2} — ABNORMAL LOW (ref >59)

## 2022-10-14 DIAGNOSIS — M25562 Pain in left knee: Secondary | ICD-10-CM | POA: Diagnosis not present

## 2022-10-14 DIAGNOSIS — Z96652 Presence of left artificial knee joint: Secondary | ICD-10-CM | POA: Diagnosis not present

## 2022-10-15 ENCOUNTER — Ambulatory Visit (INDEPENDENT_AMBULATORY_CARE_PROVIDER_SITE_OTHER): Payer: Medicare PPO | Admitting: Family Medicine

## 2022-10-15 ENCOUNTER — Telehealth: Payer: Self-pay | Admitting: Family Medicine

## 2022-10-15 ENCOUNTER — Encounter: Payer: Self-pay | Admitting: Family Medicine

## 2022-10-15 ENCOUNTER — Ambulatory Visit
Admission: RE | Admit: 2022-10-15 | Discharge: 2022-10-15 | Disposition: A | Discharge status: Home / Self Care | Payer: Medicare PPO | Source: Ambulatory Visit | Attending: Urology | Admitting: Urology

## 2022-10-15 VITALS — BP 130/100 | HR 100 | Ht 69.0 in | Wt 195.0 lb

## 2022-10-15 DIAGNOSIS — I10 Essential (primary) hypertension: Secondary | ICD-10-CM

## 2022-10-15 DIAGNOSIS — N281 Cyst of kidney, acquired: Secondary | ICD-10-CM | POA: Diagnosis not present

## 2022-10-15 DIAGNOSIS — Z85528 Personal history of other malignant neoplasm of kidney: Secondary | ICD-10-CM | POA: Diagnosis not present

## 2022-10-15 MED ORDER — HYDROCHLOROTHIAZIDE 12.5 MG PO TABS
12.5000 mg | ORAL_TABLET | Freq: Every day | ORAL | 3 refills | Status: DC
Start: 2022-10-15 — End: 2023-02-08

## 2022-10-15 NOTE — Telephone Encounter (Signed)
Copied from CRM [id number]. Topic: General - Other >> Oct 15, 2022 11:57 AM Macon Large wrote: Reason for CRM: Pt wife reports that pt blood pressure reading for Wednesday (6/26) 142/105 HR 92, Thursday (6/27) 141/97 HR 98, Friday (6/28) 139/96 HR 85. She also reports that pt has a kidney appt first part of August. Please advise if pt should continue to document blood pressure reading. Cb# [phone]

## 2022-10-15 NOTE — Progress Notes (Signed)
Date:  10/15/2022   Name:  Jason Krause   DOB:  01/03/1946   MRN:  [id number]   Chief Complaint: Hypertension (Has had 2 days of valsartan- bp running in the 140s /100s- his wrist cuff is 146/104 right now)  Hypertension The current episode started more than 1 year ago. The problem has been gradually improving since onset. The problem is uncontrolled. Pertinent negatives include no chest pain, orthopnea, palpitations, PND or shortness of breath. Past treatments include angiotensin blockers (diovin 160 mg). The current treatment provides mild improvement. There are no compliance problems.  There is no history of CAD/MI. There is no history of chronic renal disease, a hypertension causing med or renovascular disease.    Lab Results  Component Value Date   NA 144 10/12/2022   K 5.4 (H) 10/12/2022   CO2 25 10/12/2022   GLUCOSE 88 10/12/2022   BUN 12 10/12/2022   CREATININE 1.31 (H) 10/12/2022   CALCIUM 10.8 (H) 10/12/2022   EGFR 57 (L) 10/12/2022   GFRNONAA 59 (L) 07/28/2022   Lab Results  Component Value Date   CHOL 157 06/04/2022   HDL 48 06/04/2022   LDLCALC 97 06/04/2022   TRIG 58 06/04/2022   CHOLHDL 3.6 11/06/2019   Lab Results  Component Value Date   TSH 1.344 07/26/2022   Lab Results  Component Value Date   HGBA1C 5.8 (H) 07/27/2022   Lab Results  Component Value Date   WBC 8.7 08/02/2022   HGB 17.0 08/02/2022   HCT 51.3 (H) 08/02/2022   MCV 98 (H) 08/02/2022   PLT 165 08/02/2022   Lab Results  Component Value Date   ALT 17 07/27/2022   AST 24 07/27/2022   ALKPHOS 75 07/27/2022   BILITOT 1.2 07/27/2022   No results found for: "25OHVITD2", "25OHVITD3", "VD25OH"   Review of Systems  Constitutional:  Negative for unexpected weight change.  HENT:  Negative for congestion.   Respiratory:  Negative for cough, chest tightness, shortness of breath and wheezing.   Cardiovascular:  Negative for chest pain, palpitations, orthopnea and PND.  Gastrointestinal:   Negative for abdominal distention and abdominal pain.    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   Pain in both knees 10/29/2020   Essential tremor 10/29/2020   Leg weakness, bilateral 09/02/2020   Leg pain 06/22/2020   Personal history of colonic polyps    Polyp of descending colon    CKD (chronic kidney disease) stage 3, GFR 30-59 ml/min (HCC) 12/28/2017   Osteopenia 12/28/2017   Polyp of gallbladder 12/28/2017   Right bundle branch block 12/28/2017   Chronic kidney disease, stage 3 (HCC) 12/28/2017   SAH (subarachnoid hemorrhage) (HCC) 11/11/2016   Essential hypertension 11/09/2016   Mixed hyperlipidemia 11/08/2016   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   Personal history of renal cell carcinoma 11/08/2016   Cerebrovascular accident (HCC) 10/18/2016   History of kidney cancer 01/19/2007   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HCC) 01/12/2001    Allergies  Allergen Reactions   Carvedilol Other (See Comments)    "makes me a zombie" Other reaction(s): Abdominal Pain, Hallucination Lethargic    Simvastatin     Other reaction(s): Muscle Pain    Past Surgical History:  Procedure Laterality Date  COLONOSCOPY WITH PROPOFOL N/A 12/06/2019   Procedure: COLONOSCOPY and biopsy WITH PROPOFOL;  Surgeon: Midge Minium, MD;  Location: [facility];  Service: Endoscopy;  Laterality: N/A;  priority 4   HERNIA REPAIR     kidney removal due to cancer Right    POLYPECTOMY N/A 12/06/2019   Procedure: POLYPECTOMY;  Surgeon: Midge Minium, MD;  Location: [facility];  Service: Endoscopy;  Laterality: N/A;   PROSTATE SURGERY     removed    Social History   Tobacco Use   Smoking status: Never    Passive exposure: Never    Smokeless tobacco: Never  Vaping Use   Vaping Use: Never used  Substance Use Topics   Alcohol use: Yes    Comment: social   Drug use: Never     Medication list has been reviewed and updated.  Current Meds  Medication Sig   folic acid (FOLVITE) 400 MCG tablet Take 1 tablet by mouth daily.   Glucos-Chondroit-Hyaluron-MSM (GLUCOSAMINE CHONDROITIN JOINT PO) Take 2 tablets by mouth daily.   mirabegron ER (MYRBETRIQ) 50 MG TB24 tablet Take 1 tablet (50 mg total) by mouth daily.   Multiple Vitamins-Minerals (CENTRUM SILVER PO) Take 1 tablet by mouth daily.   rosuvastatin (CRESTOR) 10 MG tablet Take 1 tablet (10 mg total) by mouth daily.   valsartan (DIOVAN) 160 MG tablet Take 1 tablet (160 mg total) by mouth daily.       10/12/2022   10:19 AM 08/13/2022   10:29 AM 08/02/2022    1:27 PM 07/07/2022   11:20 AM  GAD 7 : Generalized Anxiety Score  Nervous, Anxious, on Edge 0 0 0 0  Control/stop worrying 0 0 0 0  Worry too much - different things 0 0 0 0  Trouble relaxing 0 0 0 0  Restless 0 0 0 0  Easily annoyed or irritable 0 0 0 0  Afraid - awful might happen 0 0 0 0  Total GAD 7 Score 0 0 0 0  Anxiety Difficulty Not difficult at all Not difficult at all Not difficult at all Not difficult at all       10/12/2022   10:19 AM 08/13/2022   10:28 AM 08/02/2022    1:26 PM  Depression screen PHQ 2/9  Decreased Interest 0 0 0  Down, Depressed, Hopeless 0 0 0  PHQ - 2 Score 0 0 0  Altered sleeping 0 0 0  Tired, decreased energy 0 0 0  Change in appetite 0 0 0  Feeling bad or failure about yourself  0 0 0  Trouble concentrating 0 0 0  Moving slowly or fidgety/restless 0 0 0  Suicidal thoughts 0 0 0  PHQ-9 Score 0 0 0  Difficult doing work/chores Not difficult at all Not difficult at all Not difficult at all    BP Readings from Last 3 Encounters:  10/15/22 (!) 130/100  10/12/22 (!) 140/100  08/17/22 (!) 135/93    Physical Exam HENT:     Mouth/Throat:     Mouth: Mucous  membranes are moist.  Cardiovascular:     Rate and Rhythm: Normal rate and regular rhythm.     Heart sounds: Normal heart sounds.  Pulmonary:     Breath sounds: Normal breath sounds.  Neurological:     Mental Status: He is alert.     Wt Readings from Last 3 Encounters:  10/15/22 195 lb (88.5 kg)  10/12/22 193 lb (87.5 kg)  08/17/22 198 lb (89.8 kg)  BP (!) 130/100   Pulse 100   Ht 5\' 9"  (1.753 m)   Wt 195 lb (88.5 kg)   SpO2 98%   BMI 28.80 kg/m   Assessment and Plan: 1. Essential hypertension Chronic.  Uncontrolled.  Stable however.  Most recently we changed lisinopril to valsartan 160 mg once a day.  Patient is tolerating medication well but blood pressure is not at optimal control and today was measured at 130/100.  This is an improvement so instead of doubling up on the Diovan to 320 I have suggested that I add hydrochlorothiazide 12.5 mg once in the morning.  I did bring to the attention that I looked up how long it takes to get to optimal control with medication and with the lisinopril it takes 2 to 4 weeks which is similar to probably 2 the timeframe of an ARB reaching optimal in a patient as well.  We will recheck blood pressure in 2 to 4 weeks and pending the results of that and the patient's results that he brings we will either increase to 320 valsartan or remain the same.  Patient does have upcoming appointment with nephrology Dr. Jeannine Kitten as well. - hydrochlorothiazide (HYDRODIURIL) 12.5 MG tablet; Take 1 tablet (12.5 mg total) by mouth daily.  Dispense: 90 tablet; Refill: 3     Elizabeth Sauer, MD

## 2022-10-19 DIAGNOSIS — M25562 Pain in left knee: Secondary | ICD-10-CM | POA: Diagnosis not present

## 2022-10-19 DIAGNOSIS — Z96652 Presence of left artificial knee joint: Secondary | ICD-10-CM | POA: Diagnosis not present

## 2022-10-26 DIAGNOSIS — M25562 Pain in left knee: Secondary | ICD-10-CM | POA: Diagnosis not present

## 2022-10-26 DIAGNOSIS — Z96652 Presence of left artificial knee joint: Secondary | ICD-10-CM | POA: Diagnosis not present

## 2022-11-09 DIAGNOSIS — M25562 Pain in left knee: Secondary | ICD-10-CM | POA: Diagnosis not present

## 2022-11-09 DIAGNOSIS — Z96652 Presence of left artificial knee joint: Secondary | ICD-10-CM | POA: Diagnosis not present

## 2022-11-10 DIAGNOSIS — G250 Essential tremor: Secondary | ICD-10-CM | POA: Diagnosis not present

## 2022-11-10 DIAGNOSIS — R259 Unspecified abnormal involuntary movements: Secondary | ICD-10-CM | POA: Diagnosis not present

## 2022-11-10 DIAGNOSIS — G20C Parkinsonism, unspecified: Secondary | ICD-10-CM | POA: Diagnosis not present

## 2022-11-24 DIAGNOSIS — C649 Malignant neoplasm of unspecified kidney, except renal pelvis: Secondary | ICD-10-CM | POA: Diagnosis not present

## 2022-11-24 DIAGNOSIS — N183 Chronic kidney disease, stage 3 unspecified: Secondary | ICD-10-CM | POA: Diagnosis not present

## 2022-11-24 DIAGNOSIS — I10 Essential (primary) hypertension: Secondary | ICD-10-CM | POA: Diagnosis not present

## 2022-11-29 DIAGNOSIS — L578 Other skin changes due to chronic exposure to nonionizing radiation: Secondary | ICD-10-CM | POA: Diagnosis not present

## 2022-11-29 DIAGNOSIS — H02831 Dermatochalasis of right upper eyelid: Secondary | ICD-10-CM | POA: Diagnosis not present

## 2022-11-29 DIAGNOSIS — H02834 Dermatochalasis of left upper eyelid: Secondary | ICD-10-CM | POA: Diagnosis not present

## 2022-11-29 DIAGNOSIS — H5340 Unspecified visual field defects: Secondary | ICD-10-CM | POA: Diagnosis not present

## 2022-11-29 DIAGNOSIS — H02403 Unspecified ptosis of bilateral eyelids: Secondary | ICD-10-CM | POA: Diagnosis not present

## 2022-11-30 ENCOUNTER — Ambulatory Visit: Payer: Medicare PPO

## 2022-11-30 DIAGNOSIS — M25562 Pain in left knee: Secondary | ICD-10-CM | POA: Diagnosis not present

## 2022-11-30 DIAGNOSIS — Z96652 Presence of left artificial knee joint: Secondary | ICD-10-CM | POA: Diagnosis not present

## 2022-12-06 ENCOUNTER — Ambulatory Visit: Payer: Medicare PPO | Admitting: Family Medicine

## 2022-12-06 ENCOUNTER — Encounter: Payer: Self-pay | Admitting: Family Medicine

## 2022-12-06 VITALS — BP 100/60 | HR 78 | Ht 69.0 in | Wt 195.0 lb

## 2022-12-06 DIAGNOSIS — I10 Essential (primary) hypertension: Secondary | ICD-10-CM | POA: Diagnosis not present

## 2022-12-06 DIAGNOSIS — E782 Mixed hyperlipidemia: Secondary | ICD-10-CM

## 2022-12-06 DIAGNOSIS — R944 Abnormal results of kidney function studies: Secondary | ICD-10-CM | POA: Diagnosis not present

## 2022-12-06 DIAGNOSIS — R7303 Prediabetes: Secondary | ICD-10-CM

## 2022-12-06 MED ORDER — VALSARTAN 160 MG PO TABS: 160.0000 mg | ORAL_TABLET | Freq: Every day | ORAL | 1 refills | Status: DC

## 2022-12-06 MED ORDER — ROSUVASTATIN CALCIUM 10 MG PO TABS: 10.0000 mg | ORAL_TABLET | Freq: Every day | ORAL | 1 refills | Status: DC

## 2022-12-06 NOTE — Progress Notes (Signed)
Date:  12/06/2022   Name:  Jason Krause   DOB:  14-Oct-1945   MRN:  [id number]   Chief Complaint: Hypertension, Hyperlipidemia, and Prediabetes  Hypertension This is a chronic problem. The current episode started more than 1 year ago. The problem has been gradually improving since onset. The problem is controlled. Pertinent negatives include no anxiety, blurred vision, chest pain, headaches, orthopnea, palpitations, peripheral edema, PND or shortness of breath. There are no associated agents to hypertension. Risk factors for coronary artery disease include dyslipidemia. Past treatments include angiotensin blockers. The current treatment provides moderate improvement. There are no compliance problems.  There is no history of CAD/MI. There is no history of chronic renal disease, a hypertension causing med or renovascular disease.  Hyperlipidemia This is a chronic problem. The current episode started more than 1 year ago. The problem is controlled. Recent lipid tests were reviewed and are normal. He has no history of chronic renal disease or obesity. Factors aggravating his hyperlipidemia include thiazides. Pertinent negatives include no chest pain, focal sensory loss, focal weakness, leg pain, myalgias or shortness of breath. Current antihyperlipidemic treatment includes statins. The current treatment provides moderate improvement of lipids. There are no compliance problems.  Risk factors for coronary artery disease include dyslipidemia and hypertension.  Diabetes He presents for his follow-up diabetic visit. Diabetes type: prediabetes. His disease course has been stable. Pertinent negatives for hypoglycemia include no headaches, nervousness/anxiousness, sleepiness or speech difficulty. Pertinent negatives for diabetes include no blurred vision, no chest pain, no polydipsia and no polyuria. There are no hypoglycemic complications. There are no diabetic complications. Risk factors for coronary artery  disease include dyslipidemia and hypertension. Current diabetic treatment includes diet.    Lab Results  Component Value Date   NA 144 10/12/2022   K 5.4 (H) 10/12/2022   CO2 25 10/12/2022   GLUCOSE 88 10/12/2022   BUN 12 10/12/2022   CREATININE 1.31 (H) 10/12/2022   CALCIUM 10.8 (H) 10/12/2022   EGFR 57 (L) 10/12/2022   GFRNONAA 59 (L) 07/28/2022   Lab Results  Component Value Date   CHOL 157 06/04/2022   HDL 48 06/04/2022   LDLCALC 97 06/04/2022   TRIG 58 06/04/2022   CHOLHDL 3.6 11/06/2019   Lab Results  Component Value Date   TSH 1.344 07/26/2022   Lab Results  Component Value Date   HGBA1C 5.8 (H) 07/27/2022   Lab Results  Component Value Date   WBC 8.7 08/02/2022   HGB 17.0 08/02/2022   HCT 51.3 (H) 08/02/2022   MCV 98 (H) 08/02/2022   PLT 165 08/02/2022   Lab Results  Component Value Date   ALT 17 07/27/2022   AST 24 07/27/2022   ALKPHOS 75 07/27/2022   BILITOT 1.2 07/27/2022   No results found for: "25OHVITD2", "25OHVITD3", "VD25OH"   Review of Systems  Eyes:  Negative for blurred vision.  Respiratory:  Negative for cough, chest tightness, shortness of breath, wheezing and stridor.   Cardiovascular:  Negative for chest pain, palpitations, orthopnea and PND.  Gastrointestinal:  Negative for abdominal pain.  Endocrine: Negative for polydipsia and polyuria.  Genitourinary:  Negative for decreased urine volume, flank pain, hematuria and testicular pain.  Musculoskeletal:  Negative for back pain, joint swelling and myalgias.  Neurological:  Negative for focal weakness, speech difficulty and headaches.  Hematological:  Negative for adenopathy. Does not bruise/bleed easily.  Psychiatric/Behavioral:  The patient is not nervous/anxious.     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   Pain in both knees 10/29/2020   Essential tremor 10/29/2020   Leg weakness, bilateral 09/02/2020   Leg pain 06/22/2020   Personal history of colonic polyps    Polyp of descending colon    CKD (chronic kidney disease) stage 3, GFR 30-59 ml/min (HCC) 12/28/2017   Osteopenia 12/28/2017   Polyp of gallbladder 12/28/2017   Right bundle branch block 12/28/2017   Chronic kidney disease, stage 3 (HCC) 12/28/2017   SAH (subarachnoid hemorrhage) (HCC) 11/11/2016   Essential hypertension 11/09/2016   Mixed hyperlipidemia 11/08/2016   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   Personal history of renal cell carcinoma 11/08/2016   Cerebrovascular accident (HCC) 10/18/2016   History of kidney cancer 01/19/2007   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HCC) 01/12/2001    Allergies  Allergen Reactions   Carvedilol Other (See Comments)    "makes me a zombie" Other reaction(s): Abdominal Pain, Hallucination Lethargic    Simvastatin     Other reaction(s): Muscle Pain    Past Surgical History:  Procedure Laterality Date   COLONOSCOPY WITH PROPOFOL N/A 12/06/2019   Procedure: COLONOSCOPY and biopsy WITH PROPOFOL;  Surgeon: Midge Minium, MD;  Location: [facility];  Service: Endoscopy;  Laterality: N/A;  priority 4   HERNIA REPAIR     kidney removal due to cancer Right    POLYPECTOMY N/A 12/06/2019   Procedure: POLYPECTOMY;  Surgeon: Midge Minium, MD;  Location: [facility];  Service: Endoscopy;  Laterality: N/A;   PROSTATE SURGERY     removed    Social History   Tobacco Use   Smoking status: Never    Passive exposure: Never   Smokeless tobacco: Never  Vaping Use   Vaping status: Never Used  Substance Use Topics   Alcohol use: Yes    Comment: social   Drug use: Never     Medication list has been reviewed and updated.  Current Meds  Medication Sig   folic acid (FOLVITE) 400 MCG tablet  Take 1 tablet by mouth daily.   Glucos-Chondroit-Hyaluron-MSM (GLUCOSAMINE CHONDROITIN JOINT PO) Take 2 tablets by mouth daily.   hydrochlorothiazide (HYDRODIURIL) 12.5 MG tablet Take 1 tablet (12.5 mg total) by mouth daily.   Multiple Vitamins-Minerals (CENTRUM SILVER PO) Take 1 tablet by mouth daily.   primidone (MYSOLINE) 50 MG tablet    rosuvastatin (CRESTOR) 10 MG tablet Take 1 tablet (10 mg total) by mouth daily.   valsartan (DIOVAN) 160 MG tablet Take 1 tablet (160 mg total) by mouth daily.   Vibegron (GEMTESA) 75 MG TABS Take by mouth. [place]       12/06/2022    9:59 AM 10/12/2022   10:19 AM 08/13/2022   10:29 AM 08/02/2022    1:27 PM  GAD 7 : Generalized Anxiety Score  Nervous, Anxious, on Edge 0 0 0 0  Control/stop worrying 0 0 0 0  Worry too much - different things 0 0 0 0  Trouble relaxing 0 0 0 0  Restless 0 0 0 0  Easily annoyed or irritable 0 0 0 0  Afraid - awful might happen 0 0 0 0  Total GAD 7 Score 0 0 0 0  Anxiety Difficulty Not difficult at all Not difficult at all Not difficult at  all Not difficult at all       12/06/2022    9:59 AM 10/12/2022   10:19 AM 08/13/2022   10:28 AM  Depression screen PHQ 2/9  Decreased Interest 0 0 0  Down, Depressed, Hopeless 0 0 0  PHQ - 2 Score 0 0 0  Altered sleeping 0 0 0  Tired, decreased energy 0 0 0  Change in appetite 0 0 0  Feeling bad or failure about yourself  0 0 0  Trouble concentrating 0 0 0  Moving slowly or fidgety/restless 0 0 0  Suicidal thoughts 0 0 0  PHQ-9 Score 0 0 0  Difficult doing work/chores Not difficult at all Not difficult at all Not difficult at all    BP Readings from Last 3 Encounters:  12/06/22 100/60  10/15/22 (!) 130/100  10/12/22 (!) 140/100    Physical Exam Vitals and nursing note reviewed.  HENT:     Head: Normocephalic.     Right Ear: External ear normal.     Left Ear: External ear normal.     Nose: Nose normal.  Eyes:     General: No scleral icterus.       Right eye: No  discharge.        Left eye: No discharge.     Conjunctiva/sclera: Conjunctivae normal.     Pupils: Pupils are equal, round, and reactive to light.  Neck:     Thyroid: No thyromegaly.     Vascular: No JVD.     Trachea: No tracheal deviation.  Cardiovascular:     Rate and Rhythm: Normal rate and regular rhythm.     Heart sounds: Normal heart sounds. No murmur heard.    No friction rub. No gallop.  Pulmonary:     Effort: No respiratory distress.     Breath sounds: Normal breath sounds. No wheezing or rales.  Abdominal:     General: Bowel sounds are normal.     Palpations: Abdomen is soft. There is no mass.     Tenderness: There is no abdominal tenderness. There is no guarding or rebound.  Musculoskeletal:        General: No tenderness. Normal range of motion.     Cervical back: Normal range of motion and neck supple.  Lymphadenopathy:     Cervical: No cervical adenopathy.  Skin:    General: Skin is warm.     Findings: No rash.  Neurological:     General: No focal deficit present.     Mental Status: He is alert.     Wt Readings from Last 3 Encounters:  12/06/22 195 lb (88.5 kg)  10/15/22 195 lb (88.5 kg)  10/12/22 193 lb (87.5 kg)    BP 100/60   Pulse 78   Ht 5\' 9"  (1.753 m)   Wt 195 lb (88.5 kg)   SpO2 94%   BMI 28.80 kg/m   Assessment and Plan:  1. Essential hypertension Chronic.  Controlled.  Stable.  Blood pressure 100/60.  Asymptomatic.  Tolerating medication well.  Continue valsartan 160 mg once a day.  Will recheck renal function panel given low GFR.  Patient is followed by nephrology. - valsartan (DIOVAN) 160 MG tablet; Take 1 tablet (160 mg total) by mouth daily.  Dispense: 90 tablet; Refill: 1 - Renal Function Panel  2. Mixed hyperlipidemia Chronic.  Controlled.  Stable.  Continue rosuvastatin 10 mg once a day. - rosuvastatin (CRESTOR) 10 MG tablet; Take 1 tablet (10 mg total) by mouth daily.  Dispense: 90 tablet; Refill: 1  3. Prediabetes Chronic.   Controlled.  Stable.  Previous A1c was 5.8.  Will check GFR as well and continue to encourage low glycemic intake. - HgB A1c - Renal Function Panel  4. Decreased GFR Chronic.  Followed by nephrology.  Stable.  Last GFR about 6 weeks ago was 57 but has been 48 in the past. - Renal Function Panel    Elizabeth Sauer, MD

## 2022-12-07 ENCOUNTER — Ambulatory Visit: Payer: Medicare PPO | Admitting: Urology

## 2022-12-07 ENCOUNTER — Encounter: Payer: Self-pay | Admitting: Family Medicine

## 2022-12-07 DIAGNOSIS — Z85528 Personal history of other malignant neoplasm of kidney: Secondary | ICD-10-CM

## 2022-12-07 DIAGNOSIS — R3915 Urgency of urination: Secondary | ICD-10-CM

## 2022-12-07 DIAGNOSIS — R351 Nocturia: Secondary | ICD-10-CM | POA: Diagnosis not present

## 2022-12-07 DIAGNOSIS — R399 Unspecified symptoms and signs involving the genitourinary system: Secondary | ICD-10-CM

## 2022-12-07 DIAGNOSIS — R350 Frequency of micturition: Secondary | ICD-10-CM

## 2022-12-07 LAB — BLADDER SCAN AMB NON-IMAGING

## 2022-12-07 LAB — RENAL FUNCTION PANEL
Albumin: 4.4 g/dL (ref 3.8–4.8)
BUN/Creatinine Ratio: 12 (ref 10–24)
BUN: 17 mg/dL (ref 8–27)
CO2: 27 mmol/L (ref 20–29)
Calcium: 10.5 mg/dL — ABNORMAL HIGH (ref 8.6–10.2)
Chloride: 103 mmol/L (ref 96–106)
Creatinine, Ser: 1.38 mg/dL — ABNORMAL HIGH (ref 0.76–1.27)
Glucose: 91 mg/dL (ref 70–99)
Phosphorus: 3.2 mg/dL (ref 2.8–4.1)
Potassium: 5.1 mmol/L (ref 3.5–5.2)
Sodium: 141 mmol/L (ref 134–144)
eGFR: 53 mL/min/{1.73_m2} — ABNORMAL LOW (ref >59)

## 2022-12-07 LAB — HEMOGLOBIN A1C
Est. average glucose Bld gHb Est-mCnc: 117 mg/dL
Hgb A1c MFr Bld: 5.7 % — ABNORMAL HIGH (ref 4.8–5.6)

## 2022-12-07 MED ORDER — GEMTESA 75 MG PO TABS: 75.0000 mg | ORAL_TABLET | Freq: Every day | ORAL | 3 refills | Status: AC

## 2022-12-07 NOTE — Progress Notes (Signed)
   12/07/2022 9:12 AM   Jason Krause [birth year] [id number]  Reason for visit: Follow up history of prostate cancer, kidney cancer, urinary symptoms, CKD, history of left hydronephrosis  HPI: 77 year old healthy male with a history of an open radical prostatectomy ~2004, with undetectable PSA since that time, as well as an open right radical nephrectomy ~2013 for kidney cancer.  Those records are unavailable to me(Dr. Seward Grater at [facility]).  He has opted to discontinue PSA surveillance now that he has over 20 years out from his prostate cancer which is very reasonable per the AUA guidelines.  Regarding his history of RCC, we reviewed the AUA guidelines discussing surveillance, and he opts to continue a yearly renal ultrasound.    In 2022 he had reported some spraying and split urinary stream and underwent a cystoscopy in August 2022 that was completely benign.  Hospitalized in April 2024 for acute onset of dizziness and was briefly admitted to the hospital.  Renal function at that time was at baseline with creatinine of 1.3, however CT showed some mild left hydronephrosis down to a distended bladder.  He denies any left-sided flank pain, no gross hematuria.  Using shared decision making he deferred ureteroscopy for further evaluation and opted for a repeat renal ultrasound.  I personally viewed and interpreted the renal ultrasound 10/15/2022 that shows no left-sided hydronephrosis.  Renal function is stable around baseline with an EGFR of 53.  He has overactive symptoms of frequency, urgency, and nocturia 4-5 times at night.  He ultimately changed from Myrbetriq to [place], which has been more helpful.  His nocturia has improved from 5->2, and less urgency/urge incontinence during the day.  PVR today is normal at 0ml.  Continue yearly renal ultrasounds for history of RCC, as well as history of since resolved left hydronephrosis Gemtesa prescription sent, if not affordable can try generic  Myrbetriq in the future.  Not a good candidate for anticholinergics with his age and fall risk  Sondra Come, MD  [facility] [address], [address] [place], [place] [address] [phone]

## 2022-12-14 DIAGNOSIS — M25562 Pain in left knee: Secondary | ICD-10-CM | POA: Diagnosis not present

## 2022-12-14 DIAGNOSIS — Z96652 Presence of left artificial knee joint: Secondary | ICD-10-CM | POA: Diagnosis not present

## 2022-12-22 ENCOUNTER — Ambulatory Visit: Payer: Medicare PPO | Admitting: Family Medicine

## 2022-12-22 ENCOUNTER — Telehealth: Payer: Self-pay | Admitting: Family Medicine

## 2022-12-22 ENCOUNTER — Other Ambulatory Visit: Payer: Self-pay

## 2022-12-22 ENCOUNTER — Encounter: Payer: Self-pay | Admitting: Family Medicine

## 2022-12-22 VITALS — BP 100/60 | HR 110 | Temp 99.1°F | Ht 69.0 in | Wt 200.0 lb

## 2022-12-22 DIAGNOSIS — U071 COVID-19: Secondary | ICD-10-CM

## 2022-12-22 DIAGNOSIS — R0981 Nasal congestion: Secondary | ICD-10-CM | POA: Diagnosis not present

## 2022-12-22 DIAGNOSIS — R059 Cough, unspecified: Secondary | ICD-10-CM

## 2022-12-22 LAB — POCT INFLUENZA A/B
Influenza A, POC: NEGATIVE
Influenza B, POC: NEGATIVE

## 2022-12-22 LAB — POC COVID19 BINAXNOW: SARS Coronavirus 2 Ag: POSITIVE — AB

## 2022-12-22 MED ORDER — NIRMATRELVIR/RITONAVIR (PAXLOVID) TABLET (RENAL DOSING)
2.0000 | ORAL_TABLET | Freq: Two times a day (BID) | ORAL | 0 refills | Status: AC
Start: 2022-12-22 — End: 2022-12-27

## 2022-12-22 MED ORDER — NIRMATRELVIR/RITONAVIR (PAXLOVID) TABLET (RENAL DOSING)
2.0000 | ORAL_TABLET | Freq: Two times a day (BID) | ORAL | 0 refills | Status: DC
Start: 2022-12-22 — End: 2022-12-22

## 2022-12-22 NOTE — Telephone Encounter (Signed)
Called pt he let us know that he was able to get his medication filled. Pt will go and pick the medication up.  KP

## 2022-12-22 NOTE — Telephone Encounter (Signed)
Copied from CRM [id number]. Topic: General - Other >> Dec 22, 2022  2:16 PM Everette C wrote: Reason for CRM: The patient would like to be contacted by T. Lynch when possible  Please contact further when available

## 2022-12-22 NOTE — Telephone Encounter (Signed)
Refill sent ? ?KP ?

## 2022-12-22 NOTE — Patient Instructions (Addendum)
-   Take Paxlovid for full course (contact our office for any insurance coverage issues, etc.) - Recommend over-the-counter antihistamine (Claritin, Zyrtec, etc.) daily as needed as well as Flonase, 2 sprays in each nostril daily as needed - Contact us for any recurrent/rebound symptoms and follow-up as needed

## 2022-12-22 NOTE — Progress Notes (Signed)
     Primary Care / Sports Medicine Office Visit  Patient Information:  Patient ID: Jason Krause, male DOB: 10/05/1945 Age: 77 y.o. MRN: [id number]   Jason Krause is a pleasant 77 y.o. male presenting with the following:  Chief Complaint  Patient presents with   Nasal Congestion    Cough, congestion, weak legs, yesterday    Vitals:   12/22/22 0906  BP: 100/60  Pulse: (!) 110  Temp: 99.1 F (37.3 C)  SpO2: 99%   Vitals:   12/22/22 0906  Weight: 200 lb (90.7 kg)  Height: 5\' 9"  (1.753 m)   Body mass index is 29.53 kg/m.  No results found.   Independent interpretation of notes and tests performed by another provider:   None  Procedures performed:   None  Pertinent History, Exam, Impression, and Recommendations:   [name] was seen today for nasal congestion.  COVID Assessment & Plan: 1 day of symptoms involving congestion, rhinorrhea, sore throat, "sinus symptoms" per patient. 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  Wife has had similar symptoms.  Examination with mild erythema throughout the nasal turbinates, oropharynx benign, lung fields demonstrate equal air entry without wheezes, rales, rhonchi, O2 sat 99%.  Left knee without erythema, no asymmetric warmth, with mild trace swelling, no ligamentous laxity with varus/valgus stressing, anterior/posterior drawer benign, diffusely nontender  Plan: - Rx Paxlovid given age, comorbid health conditions - Supportive care advised as well as return criteria   Orders: -     nirmatrelvir/ritonavir (renal dosing); Take 2 tablets by mouth 2 (two) times daily for 5 days. Patient GFR is 53. Take nirmatrelvir (150 mg) one tablet twice daily for 5 days and ritonavir (100 mg) one tablet twice daily for 5 days.  Dispense: 20 tablet; Refill: 0 -     POCT Influenza A/B -     POC COVID-19 BinaxNow     Orders & Medications Meds  ordered this encounter  Medications   nirmatrelvir/ritonavir, renal dosing, (PAXLOVID) 10 x 150 MG & 10 x 100MG  TABS    Sig: Take 2 tablets by mouth 2 (two) times daily for 5 days. Patient GFR is 53. Take nirmatrelvir (150 mg) one tablet twice daily for 5 days and ritonavir (100 mg) one tablet twice daily for 5 days.    Dispense:  20 tablet    Refill:  0   Orders Placed This Encounter  Procedures   POCT Influenza A/B   POC COVID-19     No follow-ups on file.     Jerrol Banana, MD, [facility]   Primary Care Sports Medicine Primary Care and Sports Medicine at [facility]

## 2022-12-22 NOTE — Assessment & Plan Note (Addendum)
1 day of symptoms involving congestion, rhinorrhea, sore throat, "sinus symptoms" per patient. 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  Wife has had similar symptoms.  Examination with mild erythema throughout the nasal turbinates, oropharynx benign, lung fields demonstrate equal air entry without wheezes, rales, rhonchi, O2 sat 99%.  Left knee without erythema, no asymmetric warmth, with mild trace swelling, no ligamentous laxity with varus/valgus stressing, anterior/posterior drawer benign, diffusely nontender  Plan: - Rx Paxlovid given age, comorbid health conditions - Supportive care advised as well as return criteria

## 2022-12-22 NOTE — Telephone Encounter (Signed)
Please resend [facility] to [facility] [place], [facility] did not have medication in stock.

## 2022-12-22 NOTE — Telephone Encounter (Signed)
Pt states Jason Krause cannot get the nirmatrelvir/ritonavir, renal dosing, (PAXLOVID) 10 x 150 MG & 10 x 100MG  TABS .  Pt would like the Rx for this med sent to  [id number] - [place], [place] - [address]

## 2022-12-23 ENCOUNTER — Ambulatory Visit: Payer: Self-pay

## 2022-12-23 ENCOUNTER — Telehealth: Payer: Self-pay

## 2022-12-23 ENCOUNTER — Other Ambulatory Visit: Payer: Self-pay

## 2022-12-23 DIAGNOSIS — U071 COVID-19: Secondary | ICD-10-CM

## 2022-12-23 NOTE — Telephone Encounter (Signed)
Summary: needing medication clarity   Patient called in requesting to speak to Dr Ashley Royalty nurse, patient stated he was prescribed medication, Paxlovid, yesterday and it states to not take other medications with it and he states the one it says not to take, patient states he is taking those and he needs to know what to do?   Patients callback #  [phone]     Chief Complaint: Paxlovid information states not to take Primidone with it. Should he hold this medication? Symptoms: n/a Frequency: n/a Pertinent Negatives: Patient denies  Disposition: [] ED /[] Urgent Care (no appt availability in office) / [] Appointment(In office/virtual)/ []  [facility]/ [] Home Care/ [] Refused Recommended Disposition /[] [facility]/ [x]  Follow-up with PCP Additional Notes: Please advise pt.  Reason for Disposition  [1] Caller has URGENT medicine question about med that PCP or specialist prescribed AND [2] triager unable to answer question  Answer Assessment - Initial Assessment Questions 1. NAME of MEDICINE: "What medicine(s) are you calling about?"     Paxlovid 2. QUESTION: "What is your question?" (e.g., double dose of medicine, side effect)     Information says not to take Primidone with Paxlovid 3. PRESCRIBER: "Who prescribed the medicine?" Reason: if prescribed by specialist, call should be referred to that group.     Dr. Ashley Royalty 4. SYMPTOMS: "Do you have any symptoms?" If Yes, ask: "What symptoms are you having?"  "How bad are the symptoms (e.g., mild, moderate, severe)     N/a 5. PREGNANCY:  "Is there any chance that you are pregnant?" "When was your last menstrual period?"     N/a  Protocols used: Medication Question Call-A-AH

## 2022-12-23 NOTE — Telephone Encounter (Signed)
Returned pt's call given his concern for Paxlovid with Primidone. I asked Dr Ashley Royalty and he is to stop/hold Primidone for the time he is taking Paxlovid and then he can resume the Primidone. Pt is taking the med for hand tremors. Pt voiced understanding

## 2023-01-04 DIAGNOSIS — G4733 Obstructive sleep apnea (adult) (pediatric): Secondary | ICD-10-CM | POA: Diagnosis not present

## 2023-01-06 ENCOUNTER — Other Ambulatory Visit: Payer: Self-pay | Admitting: Family Medicine

## 2023-01-10 NOTE — Telephone Encounter (Signed)
Requested medication (s) are due for refill today: ?  Requested medication (s) are on the active medication list: No  Last refill:  ?  Future visit scheduled: Yes  Notes to clinic:  Not on medication list.    Requested Prescriptions  Pending Prescriptions Disp Refills   lisinopril (ZESTRIL) 20 MG tablet [Pharmacy Med Name: LISINOPRIL TAB 20MG ] 90 tablet     Sig: TAKE (1) TABLET BY MOUTH EVERY DAY     Cardiovascular:  ACE Inhibitors Failed - 01/06/2023  1:11 PM      Failed - Cr in normal range and within 180 days    Creatinine, Ser  Date Value Ref Range Status  12/06/2022 1.38 (H) 0.76 - 1.27 mg/dL Final         Passed - K in normal range and within 180 days    Potassium  Date Value Ref Range Status  12/06/2022 5.1 3.5 - 5.2 mmol/L Final         Passed - Patient is not pregnant      Passed - Last BP in normal range    BP Readings from Last 1 Encounters:  12/22/22 100/60         Passed - Valid encounter within last 6 months    Recent Outpatient Visits           2 weeks ago COVID   [facility], Ocie Bob, MD   1 month ago Essential hypertension   [place] [facility] at [facility] Inches, MD   2 months ago Essential hypertension   [place] [facility] at [facility] Inches, MD   3 months ago Essential hypertension   [place] [facility] at [facility] Inches, MD   5 months ago Essential hypertension   [place] [facility] at [facility] Inches, MD       Future Appointments             In 4 weeks Duanne Limerick, MD [facility] Medicine at [facility]   In 4 months Duanne Limerick, MD [facility] Medicine at [facility]   In 11 months Richardo Hanks, Laurette Schimke, MD [facility] [place]

## 2023-01-17 DIAGNOSIS — C44722 Squamous cell carcinoma of skin of right lower limb, including hip: Secondary | ICD-10-CM | POA: Diagnosis not present

## 2023-02-08 ENCOUNTER — Ambulatory Visit: Payer: Medicare PPO | Admitting: Family Medicine

## 2023-02-08 ENCOUNTER — Telehealth: Payer: Self-pay | Admitting: Family Medicine

## 2023-02-08 ENCOUNTER — Encounter: Payer: Self-pay | Admitting: Family Medicine

## 2023-02-08 VITALS — BP 118/68 | HR 78 | Ht 68.9 in | Wt 196.0 lb

## 2023-02-08 DIAGNOSIS — R109 Unspecified abdominal pain: Secondary | ICD-10-CM

## 2023-02-08 DIAGNOSIS — I10 Essential (primary) hypertension: Secondary | ICD-10-CM

## 2023-02-08 DIAGNOSIS — R7303 Prediabetes: Secondary | ICD-10-CM | POA: Diagnosis not present

## 2023-02-08 DIAGNOSIS — Z23 Encounter for immunization: Secondary | ICD-10-CM | POA: Diagnosis not present

## 2023-02-08 DIAGNOSIS — E782 Mixed hyperlipidemia: Secondary | ICD-10-CM | POA: Diagnosis not present

## 2023-02-08 MED ORDER — HYDROCHLOROTHIAZIDE 12.5 MG PO TABS: 12.5000 mg | ORAL_TABLET | Freq: Every day | ORAL | 1 refills | Status: AC

## 2023-02-08 MED ORDER — ROSUVASTATIN CALCIUM 10 MG PO TABS: 10.0000 mg | ORAL_TABLET | Freq: Every day | ORAL | 1 refills | Status: AC

## 2023-02-08 MED ORDER — VALSARTAN 160 MG PO TABS: 160.0000 mg | ORAL_TABLET | Freq: Every day | ORAL | 1 refills | Status: AC

## 2023-02-08 NOTE — Telephone Encounter (Signed)
Copied from CRM [id number]. Topic: General - Call Back - No Documentation >> Feb 08, 2023 11:43 AM Macon Large wrote: Reason for CRM: Pt stated that he was returning a missed call that he had from the office.

## 2023-02-08 NOTE — Telephone Encounter (Signed)
Spoke with patient about labs.  - Jason Krause

## 2023-02-08 NOTE — Progress Notes (Signed)
Date:  02/08/2023   Name:  Tavoris Hucks   DOB:  08-18-1945   MRN:  [id number]   Chief Complaint: Hypertension  Hypertension This is a chronic problem. The current episode started more than 1 year ago. The problem has been gradually improving since onset. The problem is controlled. Pertinent negatives include no anxiety, blurred vision, chest pain, headaches, malaise/fatigue, neck pain, orthopnea, palpitations, peripheral edema, PND, shortness of breath or sweats. Past treatments include angiotensin blockers and direct vasodilators. The current treatment provides moderate improvement. Hypertensive end-organ damage includes CVA. There is no history of CAD/MI, left ventricular hypertrophy or PVD. There is no history of chronic renal disease, a hypertension causing med or renovascular disease.  Diabetes He presents for his follow-up diabetic visit. He has type 2 diabetes mellitus. His disease course has been stable. Pertinent negatives for hypoglycemia include no confusion, headaches or sweats. Pertinent negatives for diabetes include no blurred vision, no chest pain, no fatigue, no foot paresthesias, no polydipsia, no polyuria, no weakness and no weight loss. There are no hypoglycemic complications. Diabetic complications include a CVA. Pertinent negatives for diabetic complications include no PVD. Current diabetic treatment includes diet. He is following a generally healthy diet. Meal planning includes avoidance of concentrated sweets and carbohydrate counting. He participates in exercise intermittently.  Flank Pain This is a new problem. The current episode started more than 1 month ago. The problem occurs intermittently. The problem has been waxing and waning since onset. Worse during: worse in am. Pertinent negatives include no abdominal pain, bladder incontinence, bowel incontinence, chest pain, dysuria, headaches, leg pain, numbness, paresthesias, perianal numbness, weakness or weight loss. Risk  factors include history of cancer. The treatment provided mild relief.  Hyperlipidemia This is a chronic problem. The current episode started more than 1 year ago. The problem is controlled. Recent lipid tests were reviewed and are normal. He has no history of chronic renal disease, hypothyroidism, liver disease, obesity or nephrotic syndrome. Pertinent negatives include no chest pain, focal sensory loss, leg pain, myalgias or shortness of breath. Current antihyperlipidemic treatment includes statins. The current treatment provides moderate improvement of lipids. There are no compliance problems.  Risk factors for coronary artery disease include dyslipidemia and hypertension.    Lab Results  Component Value Date   NA 141 12/06/2022   K 5.1 12/06/2022   CO2 27 12/06/2022   GLUCOSE 91 12/06/2022   BUN 17 12/06/2022   CREATININE 1.38 (H) 12/06/2022   CALCIUM 10.5 (H) 12/06/2022   EGFR 53 (L) 12/06/2022   GFRNONAA 59 (L) 07/28/2022   Lab Results  Component Value Date   CHOL 157 06/04/2022   HDL 48 06/04/2022   LDLCALC 97 06/04/2022   TRIG 58 06/04/2022   CHOLHDL 3.6 11/06/2019   Lab Results  Component Value Date   TSH 1.344 07/26/2022   Lab Results  Component Value Date   HGBA1C 5.7 (H) 12/06/2022   Lab Results  Component Value Date   WBC 8.7 08/02/2022   HGB 17.0 08/02/2022   HCT 51.3 (H) 08/02/2022   MCV 98 (H) 08/02/2022   PLT 165 08/02/2022   Lab Results  Component Value Date   ALT 17 07/27/2022   AST 24 07/27/2022   ALKPHOS 75 07/27/2022   BILITOT 1.2 07/27/2022   No results found for: "25OHVITD2", "25OHVITD3", "VD25OH"   Review of Systems  Constitutional:  Negative for fatigue, malaise/fatigue, unexpected weight change and weight loss.  HENT:  Negative for congestion.   Eyes:  Negative for blurred vision and visual disturbance.  Respiratory:  Negative for cough, chest tightness, shortness of breath and wheezing.   Cardiovascular:  Negative for chest pain,  palpitations, orthopnea, leg swelling and PND.  Gastrointestinal:  Negative for abdominal pain and bowel incontinence.  Endocrine: Negative for polydipsia and polyuria.  Genitourinary:  Positive for flank pain. Negative for bladder incontinence, dysuria and hematuria.       Left  Musculoskeletal:  Negative for myalgias and neck pain.  Neurological:  Negative for weakness, numbness, headaches and paresthesias.  Hematological:  Negative for adenopathy.  Psychiatric/Behavioral:  Negative for confusion.     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   Pain in both knees 10/29/2020   Essential tremor 10/29/2020   Leg weakness, bilateral 09/02/2020   Leg pain 06/22/2020   History of colonic polyps    Polyp of descending colon    CKD (chronic kidney disease) stage 3, GFR 30-59 ml/min (HCC) 12/28/2017   Osteopenia 12/28/2017   Polyp of gallbladder 12/28/2017   Right bundle branch block 12/28/2017   Chronic kidney disease, stage 3 (HCC) 12/28/2017   SAH (subarachnoid hemorrhage) (HCC) 11/11/2016   Essential hypertension 11/09/2016   Mixed hyperlipidemia 11/08/2016   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   Personal history of renal cell carcinoma 11/08/2016   Cerebrovascular accident (HCC) 10/18/2016   History of kidney cancer 01/19/2007   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HCC) 01/12/2001    Allergies  Allergen Reactions   Carvedilol Other (See Comments)    "makes me a zombie" Other reaction(s): Abdominal Pain, Hallucination Lethargic    Simvastatin     Other reaction(s): Muscle Pain    Past Surgical History:  Procedure Laterality Date   COLONOSCOPY WITH PROPOFOL N/A 12/06/2019   Procedure: COLONOSCOPY  and biopsy WITH PROPOFOL;  Surgeon: Midge Minium, MD;  Location: [facility];  Service: Endoscopy;  Laterality: N/A;  priority 4   HERNIA REPAIR     kidney removal due to cancer Right    POLYPECTOMY N/A 12/06/2019   Procedure: POLYPECTOMY;  Surgeon: Midge Minium, MD;  Location: [facility];  Service: Endoscopy;  Laterality: N/A;   PROSTATE SURGERY     removed   TOTAL KNEE ARTHROPLASTY Left 08/2022    Social History   Tobacco Use   Smoking status: Never    Passive exposure: Never   Smokeless tobacco: Never  Vaping Use   Vaping status: Never Used  Substance Use Topics   Alcohol use: Yes    Comment: social   Drug use: Never     Medication list has been reviewed and updated.  Current Meds  Medication Sig   folic acid (FOLVITE) 400 MCG tablet Take 1 tablet by mouth daily.   Glucos-Chondroit-Hyaluron-MSM (GLUCOSAMINE CHONDROITIN JOINT PO) Take 2 tablets by mouth daily.   hydrochlorothiazide (HYDRODIURIL) 12.5 MG tablet Take 1 tablet (12.5 mg total) by mouth daily.   Multiple Vitamins-Minerals (CENTRUM SILVER PO) Take 1 tablet by mouth daily.   primidone (MYSOLINE) 50 MG tablet    rosuvastatin (CRESTOR) 10 MG tablet Take 1 tablet (10 mg total) by mouth daily.   valsartan (DIOVAN) 160 MG tablet Take 1 tablet (160 mg total) by mouth daily.   Vibegron [facility])  75 MG TABS Take by mouth. [place]   Vibegron (GEMTESA) 75 MG TABS Take 1 tablet (75 mg total) by mouth daily.       02/08/2023    9:58 AM 12/06/2022    9:59 AM 10/12/2022   10:19 AM 08/13/2022   10:29 AM  GAD 7 : Generalized Anxiety Score  Nervous, Anxious, on Edge 0 0 0 0  Control/stop worrying 0 0 0 0  Worry too much - different things 0 0 0 0  Trouble relaxing 0 0 0 0  Restless 0 0 0 0  Easily annoyed or irritable 0 0 0 0  Afraid - awful might happen 0 0 0 0  Total GAD 7 Score 0 0 0 0  Anxiety Difficulty Not difficult at all Not difficult at all Not difficult at all Not difficult at all        02/08/2023    9:58 AM 12/06/2022    9:59 AM 10/12/2022   10:19 AM  Depression screen PHQ 2/9  Decreased Interest 0 0 0  Down, Depressed, Hopeless 0 0 0  PHQ - 2 Score 0 0 0  Altered sleeping 0 0 0  Tired, decreased energy 0 0 0  Change in appetite 0 0 0  Feeling bad or failure about yourself  0 0 0  Trouble concentrating 0 0 0  Moving slowly or fidgety/restless 0 0 0  Suicidal thoughts 0 0 0  PHQ-9 Score 0 0 0  Difficult doing work/chores Not difficult at all Not difficult at all Not difficult at all    BP Readings from Last 3 Encounters:  02/08/23 118/68  12/22/22 100/60  12/06/22 100/60    Physical Exam Vitals and nursing note reviewed.  HENT:     Head: Normocephalic.     Right Ear: Tympanic membrane and external ear normal.     Left Ear: Tympanic membrane and external ear normal.     Nose: Nose normal. No congestion or rhinorrhea.     Mouth/Throat:     Mouth: Mucous membranes are moist.  Eyes:     General: No scleral icterus.       Right eye: No discharge.        Left eye: No discharge.     Conjunctiva/sclera: Conjunctivae normal.     Pupils: Pupils are equal, round, and reactive to light.  Neck:     Thyroid: No thyromegaly.     Vascular: No JVD.     Trachea: No tracheal deviation.  Cardiovascular:     Rate and Rhythm: Normal rate and regular rhythm.     Heart sounds: Normal heart sounds. No murmur heard.    No friction rub. No gallop.  Pulmonary:     Effort: No respiratory distress.     Breath sounds: Normal breath sounds. No wheezing, rhonchi or rales.  Chest:     Chest wall: No tenderness.  Abdominal:     General: Bowel sounds are normal.     Palpations: Abdomen is soft. There is no mass.     Tenderness: There is no abdominal tenderness. There is no guarding or rebound.  Musculoskeletal:        General: No tenderness. Normal range of motion.     Cervical back: Normal range of motion and neck supple.  Lymphadenopathy:     Cervical: No cervical  adenopathy.  Skin:    General: Skin is warm.     Findings: No rash.  Neurological:     Mental Status: He  is alert and oriented to person, place, and time.     Cranial Nerves: No cranial nerve deficit.     Deep Tendon Reflexes: Reflexes are normal and symmetric.     Wt Readings from Last 3 Encounters:  02/08/23 196 lb (88.9 kg)  12/22/22 200 lb (90.7 kg)  12/06/22 195 lb (88.5 kg)    BP 118/68   Pulse 78   Ht 5' 8.9" (1.75 m)   Wt 196 lb (88.9 kg)   SpO2 97%   BMI 29.03 kg/m   Assessment and Plan:  1. Essential hypertension Chronic.  Controlled.  Stable.  Blood pressure 118/68.  Asymptomatic.  Tolerating medication well.  Continue hydrochlorothiazide 12.5 mg once a day and valsartan 160 mg once a day.  Will recheck patient in 6 months. - hydrochlorothiazide (HYDRODIURIL) 12.5 MG tablet; Take 1 tablet (12.5 mg total) by mouth daily.  Dispense: 90 tablet; Refill: 1 - valsartan (DIOVAN) 160 MG tablet; Take 1 tablet (160 mg total) by mouth daily.  Dispense: 90 tablet; Refill: 1  2. Mixed hyperlipidemia Chronic.  Controlled.  Stable.  Continue rosuvastatin 10 mg once a day.  Patient is without side effects and is tolerating medication without myalgias.  Will check lipid panel for current level of LDL control. - rosuvastatin (CRESTOR) 10 MG tablet; Take 1 tablet (10 mg total) by mouth daily.  Dispense: 90 tablet; Refill: 1 - Lipid Panel With LDL/HDL Ratio  3. Prediabetes Chronic.  Controlled.  Stable.  Reemphasized elimination of concentrated sweets and including a vigilance of the carbohydrate intake.  Lipid panel for evaluation of triglycerides. - Lipid Panel With LDL/HDL Ratio  4. Left flank pain New onset.  Over the course of a couple months has developed left flank pain.  Review of last ultrasound per Dr. Irish Elders and CT is unremarkable.  Patient is noted no hematuria and there has not been any weight loss.  Flank pain is predominantly on the left and only occurs in the  mornings when he wakes up which probably suggest a musculoskeletal origin.  5. Need for influenza vaccination Discussed and administered - Flu Vaccine Trivalent High Dose (Fluad)    Elizabeth Sauer, MD

## 2023-02-09 DIAGNOSIS — R7303 Prediabetes: Secondary | ICD-10-CM | POA: Diagnosis not present

## 2023-02-09 DIAGNOSIS — E782 Mixed hyperlipidemia: Secondary | ICD-10-CM | POA: Diagnosis not present

## 2023-02-10 ENCOUNTER — Encounter: Payer: Self-pay | Admitting: Family Medicine

## 2023-02-10 LAB — LIPID PANEL WITH LDL/HDL RATIO
Cholesterol, Total: 158 mg/dL (ref 100–199)
HDL: 53 mg/dL (ref >39)
LDL Chol Calc (NIH): 93 mg/dL (ref 0–99)
LDL/HDL Ratio: 1.8 ratio (ref 0.0–3.6)
Triglycerides: 59 mg/dL (ref 0–149)
VLDL Cholesterol Cal: 12 mg/dL (ref 5–40)

## 2023-03-02 DIAGNOSIS — G250 Essential tremor: Secondary | ICD-10-CM | POA: Diagnosis not present

## 2023-03-02 DIAGNOSIS — G20C Parkinsonism, unspecified: Secondary | ICD-10-CM | POA: Diagnosis not present

## 2023-03-08 DIAGNOSIS — H02834 Dermatochalasis of left upper eyelid: Secondary | ICD-10-CM | POA: Diagnosis not present

## 2023-03-08 DIAGNOSIS — H02831 Dermatochalasis of right upper eyelid: Secondary | ICD-10-CM | POA: Diagnosis not present

## 2023-03-08 DIAGNOSIS — H1045 Other chronic allergic conjunctivitis: Secondary | ICD-10-CM | POA: Diagnosis not present

## 2023-03-08 DIAGNOSIS — H26493 Other secondary cataract, bilateral: Secondary | ICD-10-CM | POA: Diagnosis not present

## 2023-04-05 DIAGNOSIS — C44722 Squamous cell carcinoma of skin of right lower limb, including hip: Secondary | ICD-10-CM | POA: Diagnosis not present

## 2023-04-07 DIAGNOSIS — G4733 Obstructive sleep apnea (adult) (pediatric): Secondary | ICD-10-CM | POA: Diagnosis not present

## 2023-04-21 DIAGNOSIS — Z85828 Personal history of other malignant neoplasm of skin: Secondary | ICD-10-CM | POA: Diagnosis not present

## 2023-04-21 DIAGNOSIS — Z872 Personal history of diseases of the skin and subcutaneous tissue: Secondary | ICD-10-CM | POA: Diagnosis not present

## 2023-04-21 DIAGNOSIS — D2271 Melanocytic nevi of right lower limb, including hip: Secondary | ICD-10-CM | POA: Diagnosis not present

## 2023-04-21 DIAGNOSIS — Z85820 Personal history of malignant melanoma of skin: Secondary | ICD-10-CM | POA: Diagnosis not present

## 2023-04-21 DIAGNOSIS — L578 Other skin changes due to chronic exposure to nonionizing radiation: Secondary | ICD-10-CM | POA: Diagnosis not present

## 2023-04-21 DIAGNOSIS — L570 Actinic keratosis: Secondary | ICD-10-CM | POA: Diagnosis not present

## 2023-04-21 DIAGNOSIS — D485 Neoplasm of uncertain behavior of skin: Secondary | ICD-10-CM | POA: Diagnosis not present

## 2023-04-21 DIAGNOSIS — Z859 Personal history of malignant neoplasm, unspecified: Secondary | ICD-10-CM | POA: Diagnosis not present

## 2023-04-21 DIAGNOSIS — C4362 Malignant melanoma of left upper limb, including shoulder: Secondary | ICD-10-CM | POA: Diagnosis not present

## 2023-04-28 DIAGNOSIS — C4362 Malignant melanoma of left upper limb, including shoulder: Secondary | ICD-10-CM | POA: Diagnosis not present

## 2023-05-02 DIAGNOSIS — G250 Essential tremor: Secondary | ICD-10-CM | POA: Diagnosis not present

## 2023-05-02 DIAGNOSIS — N1831 Chronic kidney disease, stage 3a: Secondary | ICD-10-CM | POA: Diagnosis not present

## 2023-05-02 DIAGNOSIS — Z1321 Encounter for screening for nutritional disorder: Secondary | ICD-10-CM | POA: Diagnosis not present

## 2023-05-02 DIAGNOSIS — Z8673 Personal history of transient ischemic attack (TIA), and cerebral infarction without residual deficits: Secondary | ICD-10-CM | POA: Diagnosis not present

## 2023-05-02 DIAGNOSIS — Z0000 Encounter for general adult medical examination without abnormal findings: Secondary | ICD-10-CM | POA: Diagnosis not present

## 2023-05-02 DIAGNOSIS — E782 Mixed hyperlipidemia: Secondary | ICD-10-CM | POA: Diagnosis not present

## 2023-05-02 DIAGNOSIS — Z85528 Personal history of other malignant neoplasm of kidney: Secondary | ICD-10-CM | POA: Diagnosis not present

## 2023-05-02 DIAGNOSIS — I129 Hypertensive chronic kidney disease with stage 1 through stage 4 chronic kidney disease, or unspecified chronic kidney disease: Secondary | ICD-10-CM | POA: Diagnosis not present

## 2023-05-04 DIAGNOSIS — C4362 Malignant melanoma of left upper limb, including shoulder: Secondary | ICD-10-CM | POA: Diagnosis not present

## 2023-05-05 DIAGNOSIS — H53453 Other localized visual field defect, bilateral: Secondary | ICD-10-CM | POA: Diagnosis not present

## 2023-05-05 DIAGNOSIS — H02831 Dermatochalasis of right upper eyelid: Secondary | ICD-10-CM | POA: Diagnosis not present

## 2023-05-05 DIAGNOSIS — H02834 Dermatochalasis of left upper eyelid: Secondary | ICD-10-CM | POA: Diagnosis not present

## 2023-05-05 DIAGNOSIS — Z888 Allergy status to other drugs, medicaments and biological substances status: Secondary | ICD-10-CM | POA: Diagnosis not present

## 2023-05-05 DIAGNOSIS — I10 Essential (primary) hypertension: Secondary | ICD-10-CM | POA: Diagnosis not present

## 2023-05-05 DIAGNOSIS — H5340 Unspecified visual field defects: Secondary | ICD-10-CM | POA: Diagnosis not present

## 2023-05-05 DIAGNOSIS — H02403 Unspecified ptosis of bilateral eyelids: Secondary | ICD-10-CM | POA: Diagnosis not present

## 2023-05-05 DIAGNOSIS — H57813 Brow ptosis, bilateral: Secondary | ICD-10-CM | POA: Diagnosis not present

## 2023-05-26 ENCOUNTER — Ambulatory Visit: Payer: Medicare PPO | Admitting: Family Medicine

## 2023-05-27 DIAGNOSIS — D0371 Melanoma in situ of right lower limb, including hip: Secondary | ICD-10-CM | POA: Diagnosis not present

## 2023-05-27 DIAGNOSIS — C4362 Malignant melanoma of left upper limb, including shoulder: Secondary | ICD-10-CM | POA: Diagnosis not present

## 2023-05-27 DIAGNOSIS — L905 Scar conditions and fibrosis of skin: Secondary | ICD-10-CM | POA: Diagnosis not present

## 2023-06-16 DIAGNOSIS — C4362 Malignant melanoma of left upper limb, including shoulder: Secondary | ICD-10-CM | POA: Diagnosis not present

## 2023-11-21 ENCOUNTER — Other Ambulatory Visit

## 2023-11-21 ENCOUNTER — Ambulatory Visit
Admission: RE | Admit: 2023-11-21 | Discharge: 2023-11-21 | Disposition: A | Discharge status: Home / Self Care | Source: Ambulatory Visit | Attending: Urology | Admitting: Urology

## 2023-11-21 DIAGNOSIS — Z85528 Personal history of other malignant neoplasm of kidney: Secondary | ICD-10-CM | POA: Insufficient documentation

## 2023-12-07 ENCOUNTER — Ambulatory Visit: Payer: Self-pay | Admitting: Urology

## 2023-12-08 ENCOUNTER — Ambulatory Visit: Admitting: Urology

## 2023-12-08 VITALS — BP 127/84 | HR 52 | Wt 194.0 lb

## 2023-12-08 DIAGNOSIS — C61 Malignant neoplasm of prostate: Secondary | ICD-10-CM

## 2023-12-08 DIAGNOSIS — R399 Unspecified symptoms and signs involving the genitourinary system: Secondary | ICD-10-CM | POA: Diagnosis not present

## 2023-12-08 DIAGNOSIS — Z85528 Personal history of other malignant neoplasm of kidney: Secondary | ICD-10-CM

## 2023-12-08 NOTE — Progress Notes (Signed)
   12/08/2023 10:06 AM   Jason Krause 07-Jul-1945 [id number]  Reason for visit: Follow up history of prostate cancer, kidney cancer, urinary symptoms, CKD, history of left hydronephrosis  HPI: 78 year old healthy male with a history of an open radical prostatectomy ~2004, with undetectable PSA since that time, as well as an open right radical nephrectomy ~2013 for kidney cancer.  Those records are unavailable to me(Dr. Rowland at [facility]).  He has opted to discontinue PSA surveillance now that he has over 20 years out from his prostate cancer which is very reasonable per the AUA guidelines.  Regarding his history of RCC, we reviewed the AUA guidelines discussing surveillance, and he opts to continue a yearly renal ultrasound.    In 2022 he had reported some spraying and split urinary stream and underwent a cystoscopy in August 2022 that was completely benign.Hospitalized in April 2024 for acute onset of dizziness and was briefly admitted to the hospital.  Renal function at that time was at baseline with creatinine of 1.3, however CT showed some mild left hydronephrosis down to a distended bladder.  He deferred ureteroscopy for further evaluation and a follow-up renal ultrasound was benign, renal function has been stable.    He has mild urinary symptoms of some split stream and frequency, nocturia 1-3 times nightly.  Previously was on Gemtesa , ultimately felt this was not making significant improvement in his urinary symptoms and discontinued that medication.  PVRs have been normal.  He opts to stay off any medications at this time and behavioral strategies discussed.  I personally reviewed and interpreted the most recent renal ultrasound dated 11/21/23 that shows solitary left kidney with benign cysts, no hydronephrosis.  Continue yearly renal ultrasounds for history of RCC, as well as history of since resolved left hydronephrosis RTC 1 year, PVR prior, renal ultrasound prior  Jason JAYSON Burnet, MD  [facility] [address] [place], [place] [address] [phone]

## 2023-12-08 NOTE — Patient Instructions (Signed)

## 2024-12-11 ENCOUNTER — Ambulatory Visit: Admitting: Urology
